# Patient Record
Sex: Male | Born: 1945 | Race: White | Hispanic: No | Marital: Married | Smoking: Never smoker
Health system: Southern US, Community
[De-identification: ages and names within clinical notes are randomized; demographics above are authoritative.]

## PROBLEM LIST (undated history)

## (undated) DIAGNOSIS — N400 Enlarged prostate without lower urinary tract symptoms: Secondary | ICD-10-CM

## (undated) DIAGNOSIS — R251 Tremor, unspecified: Secondary | ICD-10-CM

## (undated) DIAGNOSIS — M5416 Radiculopathy, lumbar region: Secondary | ICD-10-CM

## (undated) DIAGNOSIS — D649 Anemia, unspecified: Secondary | ICD-10-CM

## (undated) DIAGNOSIS — M1990 Unspecified osteoarthritis, unspecified site: Secondary | ICD-10-CM

## (undated) DIAGNOSIS — F419 Anxiety disorder, unspecified: Secondary | ICD-10-CM

## (undated) DIAGNOSIS — I10 Essential (primary) hypertension: Secondary | ICD-10-CM

## (undated) DIAGNOSIS — E042 Nontoxic multinodular goiter: Secondary | ICD-10-CM

## (undated) DIAGNOSIS — K602 Anal fissure, unspecified: Secondary | ICD-10-CM

## (undated) DIAGNOSIS — R51 Headache: Secondary | ICD-10-CM

## (undated) DIAGNOSIS — E785 Hyperlipidemia, unspecified: Secondary | ICD-10-CM

## (undated) DIAGNOSIS — N529 Male erectile dysfunction, unspecified: Secondary | ICD-10-CM

## (undated) DIAGNOSIS — C801 Malignant (primary) neoplasm, unspecified: Secondary | ICD-10-CM

## (undated) DIAGNOSIS — G4730 Sleep apnea, unspecified: Secondary | ICD-10-CM

## (undated) DIAGNOSIS — F329 Major depressive disorder, single episode, unspecified: Secondary | ICD-10-CM

## (undated) DIAGNOSIS — R233 Spontaneous ecchymoses: Secondary | ICD-10-CM

## (undated) DIAGNOSIS — L80 Vitiligo: Secondary | ICD-10-CM

## (undated) DIAGNOSIS — K2270 Barrett's esophagus without dysplasia: Secondary | ICD-10-CM

## (undated) DIAGNOSIS — Z8719 Personal history of other diseases of the digestive system: Secondary | ICD-10-CM

## (undated) DIAGNOSIS — K219 Gastro-esophageal reflux disease without esophagitis: Secondary | ICD-10-CM

## (undated) DIAGNOSIS — M51369 Other intervertebral disc degeneration, lumbar region without mention of lumbar back pain or lower extremity pain: Secondary | ICD-10-CM

## (undated) DIAGNOSIS — N189 Chronic kidney disease, unspecified: Secondary | ICD-10-CM

## (undated) DIAGNOSIS — F32A Depression, unspecified: Secondary | ICD-10-CM

## (undated) DIAGNOSIS — M5136 Other intervertebral disc degeneration, lumbar region: Secondary | ICD-10-CM

## (undated) DIAGNOSIS — G894 Chronic pain syndrome: Secondary | ICD-10-CM

## (undated) HISTORY — PX: HERNIA REPAIR: SHX51

## (undated) HISTORY — PX: OTHER SURGICAL HISTORY: SHX169

## (undated) HISTORY — PX: FUNCTIONAL ENDOSCOPIC SINUS SURGERY: SUR616

## (undated) HISTORY — PX: JOINT REPLACEMENT: SHX530

## (undated) HISTORY — PX: COLONOSCOPY: SHX174

## (undated) HISTORY — PX: ESOPHAGOGASTRODUODENOSCOPY: SHX1529

## (undated) HISTORY — PX: APPENDECTOMY: SHX54

---

## 2003-12-10 ENCOUNTER — Inpatient Hospital Stay (HOSPITAL_COMMUNITY): Admission: RE | Admit: 2003-12-10 | Discharge: 2003-12-13 | Payer: Self-pay | Admitting: Orthopedic Surgery

## 2004-01-07 ENCOUNTER — Other Ambulatory Visit: Payer: Self-pay

## 2004-01-12 ENCOUNTER — Encounter: Payer: Self-pay | Admitting: Orthopedic Surgery

## 2005-01-30 ENCOUNTER — Other Ambulatory Visit: Payer: Self-pay

## 2005-01-30 ENCOUNTER — Emergency Department: Payer: Self-pay | Admitting: Emergency Medicine

## 2006-01-07 ENCOUNTER — Ambulatory Visit: Payer: Self-pay | Admitting: Gastroenterology

## 2006-03-22 ENCOUNTER — Ambulatory Visit: Payer: Self-pay | Admitting: Gastroenterology

## 2006-04-22 ENCOUNTER — Ambulatory Visit: Payer: Self-pay | Admitting: General Surgery

## 2006-04-22 ENCOUNTER — Other Ambulatory Visit: Payer: Self-pay

## 2006-05-05 ENCOUNTER — Ambulatory Visit: Payer: Self-pay | Admitting: General Surgery

## 2006-06-18 ENCOUNTER — Ambulatory Visit: Payer: Self-pay | Admitting: Gastroenterology

## 2006-09-22 ENCOUNTER — Inpatient Hospital Stay (HOSPITAL_COMMUNITY): Admission: RE | Admit: 2006-09-22 | Discharge: 2006-09-26 | Payer: Self-pay | Admitting: Orthopedic Surgery

## 2006-10-11 ENCOUNTER — Encounter: Payer: Self-pay | Admitting: Orthopedic Surgery

## 2006-10-12 ENCOUNTER — Encounter: Payer: Self-pay | Admitting: Orthopedic Surgery

## 2006-10-19 ENCOUNTER — Ambulatory Visit: Payer: Self-pay | Admitting: Otolaryngology

## 2006-10-21 ENCOUNTER — Ambulatory Visit: Payer: Self-pay | Admitting: Otolaryngology

## 2006-11-12 ENCOUNTER — Encounter: Payer: Self-pay | Admitting: Orthopedic Surgery

## 2007-03-07 ENCOUNTER — Inpatient Hospital Stay: Payer: Self-pay | Admitting: Internal Medicine

## 2007-03-07 ENCOUNTER — Other Ambulatory Visit: Payer: Self-pay

## 2007-05-27 ENCOUNTER — Ambulatory Visit: Payer: Self-pay | Admitting: Otolaryngology

## 2007-09-26 ENCOUNTER — Encounter: Payer: Self-pay | Admitting: Otolaryngology

## 2007-10-12 ENCOUNTER — Encounter: Payer: Self-pay | Admitting: Otolaryngology

## 2007-10-18 ENCOUNTER — Other Ambulatory Visit: Payer: Self-pay

## 2007-10-18 ENCOUNTER — Emergency Department: Payer: Self-pay | Admitting: Emergency Medicine

## 2007-11-17 ENCOUNTER — Other Ambulatory Visit: Payer: Self-pay

## 2007-11-17 ENCOUNTER — Emergency Department: Payer: Self-pay | Admitting: Unknown Physician Specialty

## 2008-02-27 ENCOUNTER — Encounter: Admission: RE | Admit: 2008-02-27 | Discharge: 2008-02-27 | Payer: Self-pay | Admitting: Orthopedic Surgery

## 2008-03-23 ENCOUNTER — Ambulatory Visit: Payer: Self-pay | Admitting: Orthopedic Surgery

## 2008-06-25 ENCOUNTER — Ambulatory Visit: Payer: Self-pay | Admitting: Unknown Physician Specialty

## 2008-08-06 ENCOUNTER — Ambulatory Visit: Payer: Self-pay | Admitting: Unknown Physician Specialty

## 2008-08-14 ENCOUNTER — Ambulatory Visit: Payer: Self-pay | Admitting: Unknown Physician Specialty

## 2009-08-08 ENCOUNTER — Ambulatory Visit: Payer: Self-pay | Admitting: Gastroenterology

## 2010-04-04 ENCOUNTER — Inpatient Hospital Stay: Payer: Self-pay | Admitting: Internal Medicine

## 2010-04-08 IMAGING — CT CT HEAD WITHOUT CONTRAST
2 series · 16 of 30 positions shown, 20 images · non-contrast
Comparison: none

REASON FOR EXAM: dizzy,weak pt in rm # [id number]
COMMENTS:

[Series 2: without · axial · non-contrast · 0.40mm/px · z∈[+416,+551]mm · 13 of 33 slices shown, 17 images]
[im 3/33  brain]
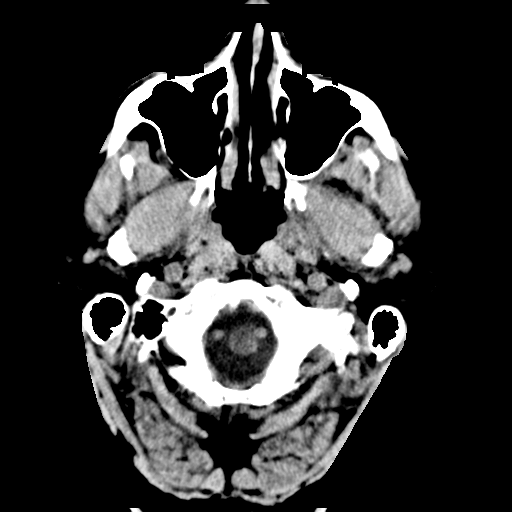
[im 3/33  bone]
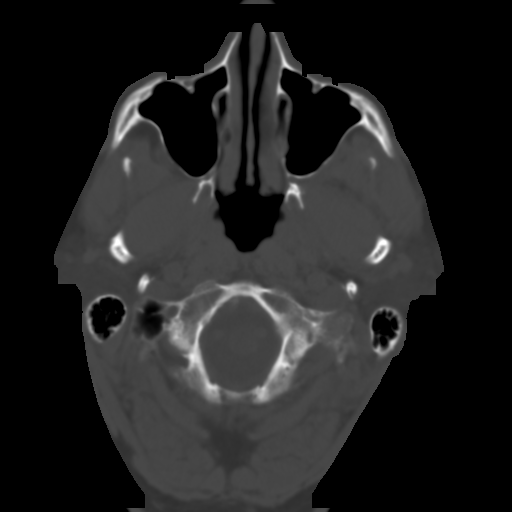
[im 5/33  brain]
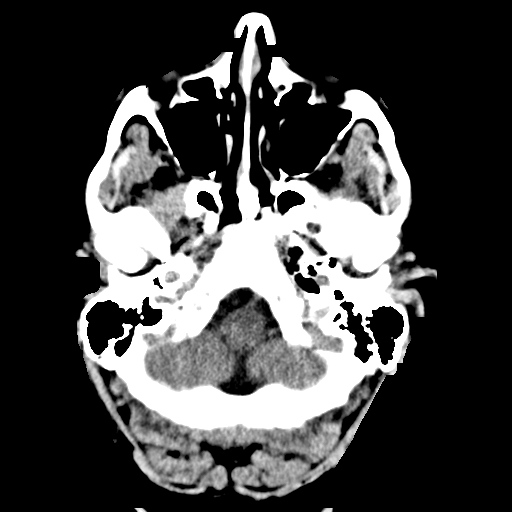
[im 7/33  brain]
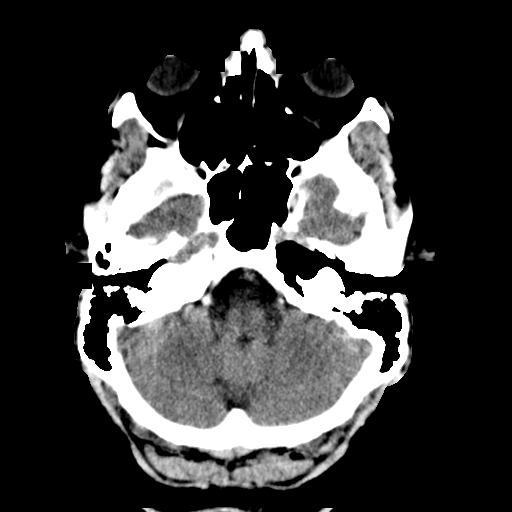
[im 10/33  brain]
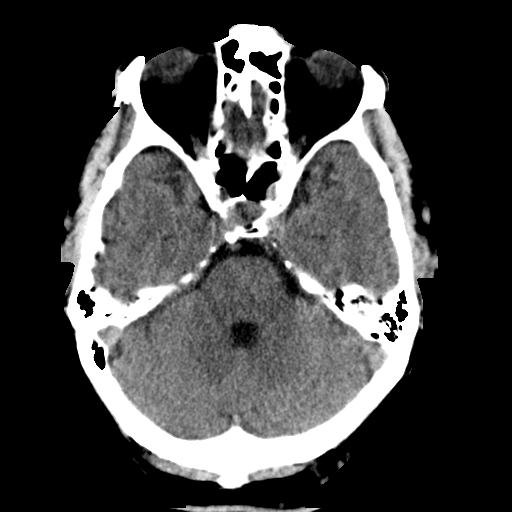
[im 12/33  brain]
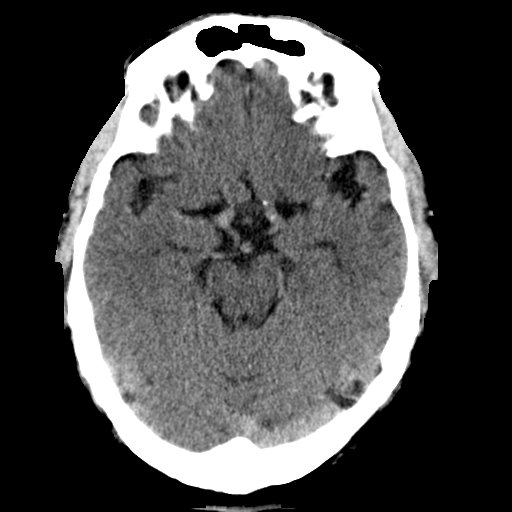
[im 12/33  bone]
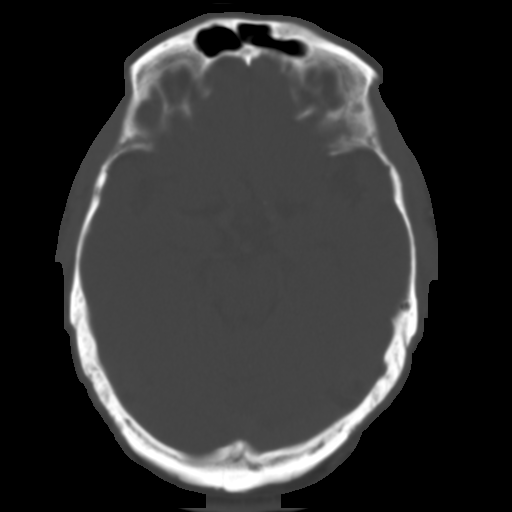
[im 14/33  brain]
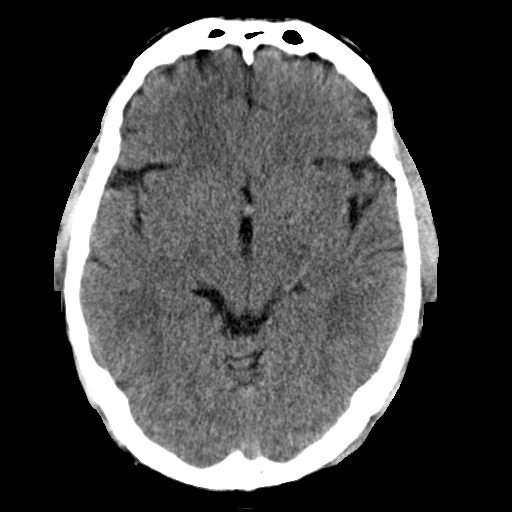
[im 17/33  brain]
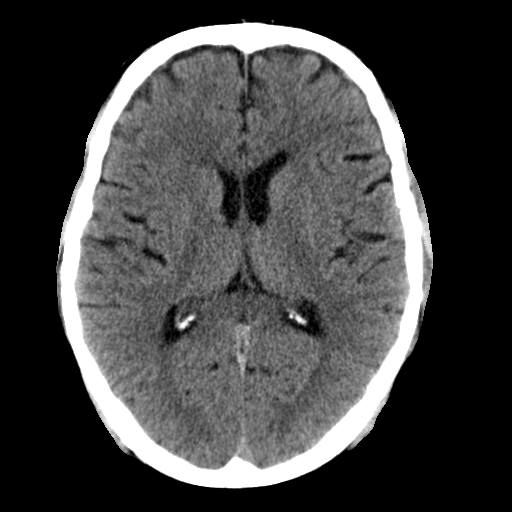
[im 19/33  brain]
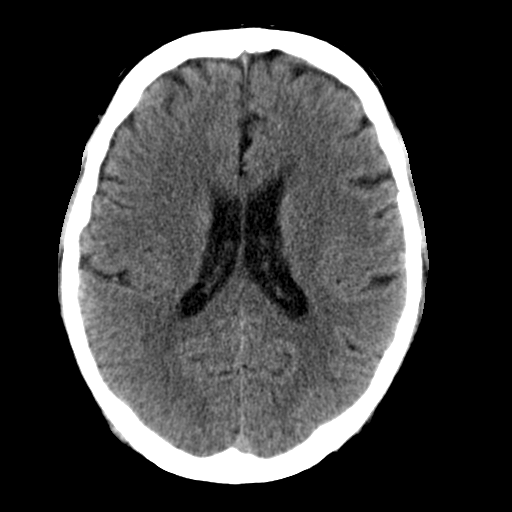
[im 21/33  brain]
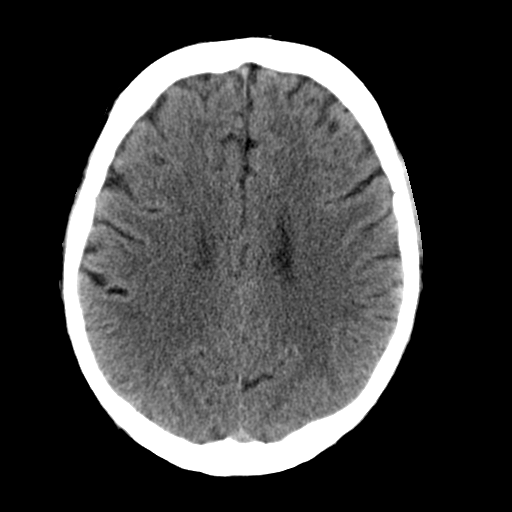
[im 21/33  bone]
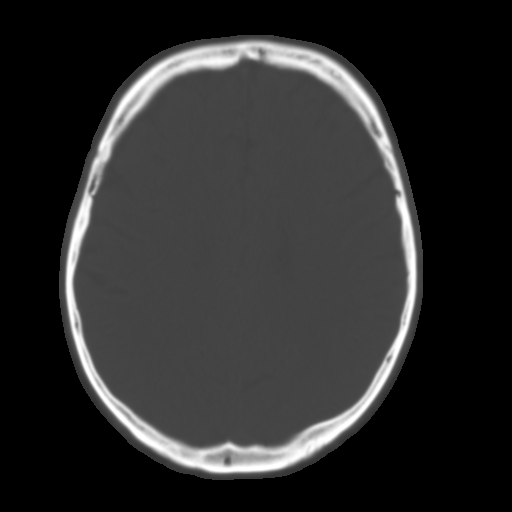
[im 23/33  brain]
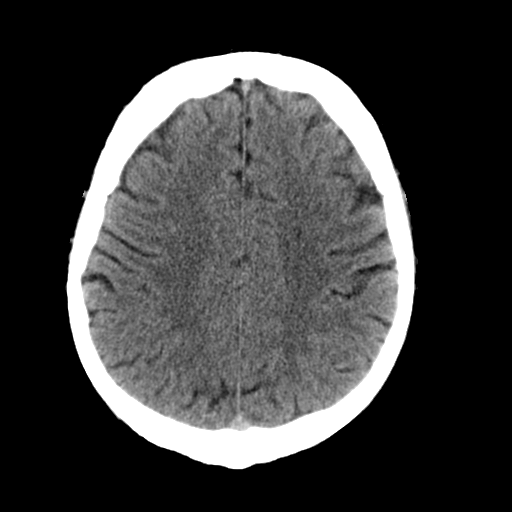
[im 26/33  brain]
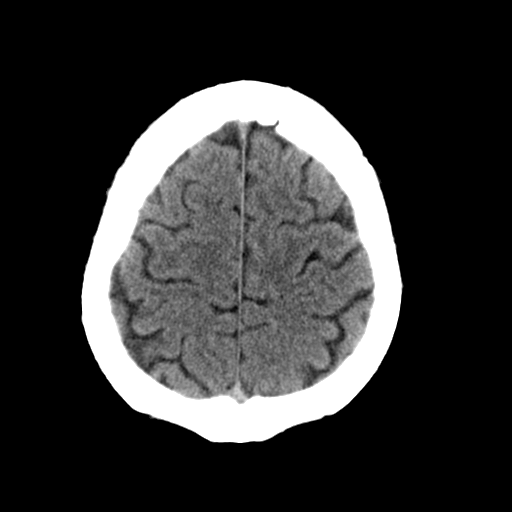
[im 28/33  brain]
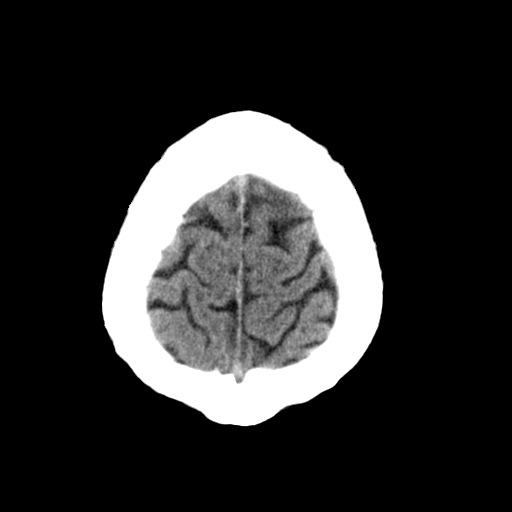
[im 30/33  brain]
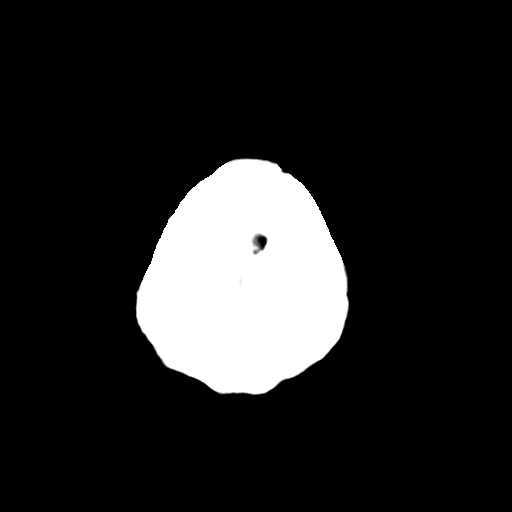
[im 30/33  bone]
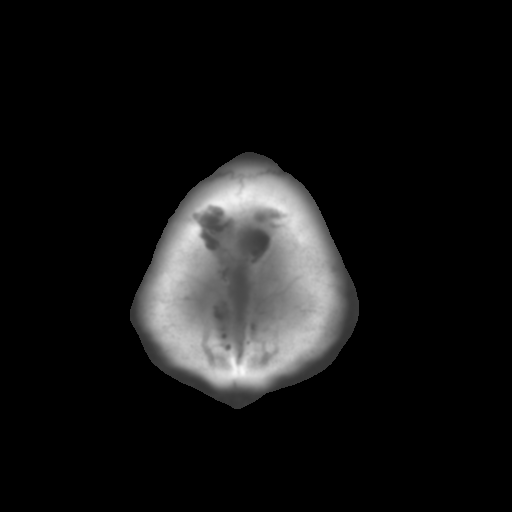

[Series 3: bone · axial · 0.40mm/px · z∈[+416,+461]mm · 3 of 33 slices shown]
[im 3/33  bone]
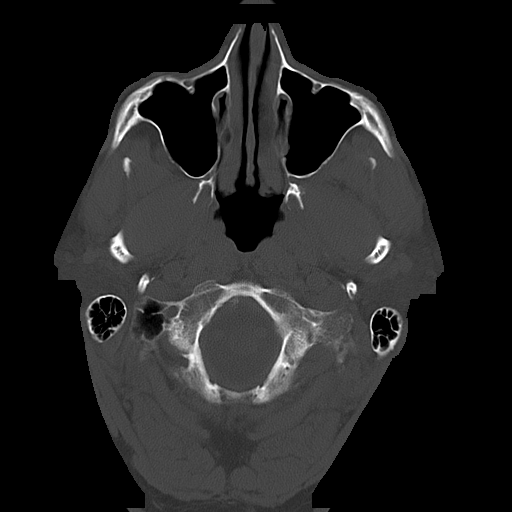
[im 7/33  bone]
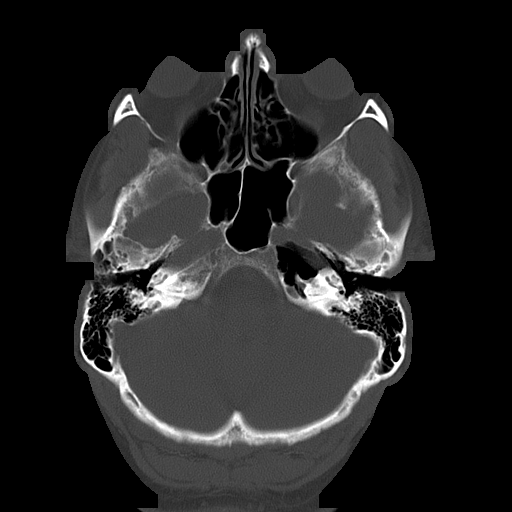
[im 12/33  bone]
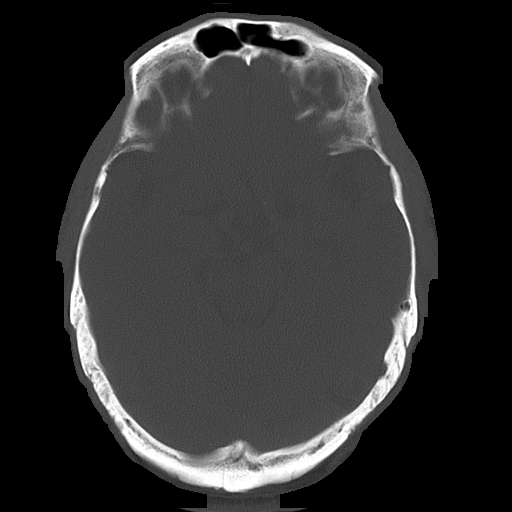

[16 of 30 positions shown; findings below may reference images not displayed]

PROCEDURE:     CT  - CT HEAD WITHOUT CONTRAST  - October 18, 2007 [DATE]

RESULT:     Noncontrast emergent CT of the brain is performed in the
standard fashion and compared to previous examination performed on
03/07/2007.

The ventricles and sulci are normal. There is no hemorrhage. There is no
focal mass, mass-effect or midline shift. There is no evidence of edema or
territorial infarct. The bone windows demonstrate normal aeration of the
paranasal sinuses and mastoid air cells. There is no skull fracture
demonstrated.
IMPRESSION: 1. No acute intracranial abnormality.

## 2010-04-13 HISTORY — PX: JOINT REPLACEMENT: SHX530

## 2010-07-24 ENCOUNTER — Encounter: Payer: Self-pay | Admitting: Internal Medicine

## 2010-08-12 ENCOUNTER — Encounter: Payer: Self-pay | Admitting: Internal Medicine

## 2010-08-13 ENCOUNTER — Ambulatory Visit: Payer: Self-pay | Admitting: Family Medicine

## 2010-08-29 NOTE — Discharge Summary (Signed)
Jeffrey [name], Jeffrey [name]              ACCOUNT NO.:  [id number]   MEDICAL RECORD NO.:  [id number]          PATIENT TYPE:  INP   LOCATION:  1540                         FACILITY:  [facility]   PHYSICIAN:  Ollen Gross, M.D.    DATE OF BIRTH:  09-01-1945   DATE OF ADMISSION:  09/22/2006  DATE OF DISCHARGE:  09/26/2006                               DISCHARGE SUMMARY   ADMISSION DIAGNOSES:  1. Osteoarthritis right knee.  2. History of migraines.  3. Anxiety.  4. Depression.  5. Hypertension.  6. Heart murmur.  7. Hiatal hernia.  8. Enlarged prostate.  9. Reflux disease.  10.Osteoporosis.  11.Degenerative disk disease.  12.Abdominal hernia.  13.Barrett's esophagus.  14.Hemorrhoids.  15.History of sleep apnea.  16.Hypertension.  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  18.Hyperlipidemia.  19.Anal fissure.   DISCHARGE DIAGNOSES:  1. Osteoarthritis right knee status post right total knee      arthroplasty.  2. Postop blood loss anemia.  Did not require transfusion.  3. History of migraines.  4. Anxiety.  5. Depression.  6. Hypertension.  7. Heart murmur.  8. Hiatal hernia.  9. Enlarged prostate.  10.Reflux disease.  11.Osteoporosis.  12.Degenerative disk disease.  13.Abdominal hernia.  14.Barrett's esophagus.  15.Hemorrhoids.  16.History of sleep apnea.  17.Hypertension.  18.Familial tremor.  19.Hyperlipidemia.  20.Anal fissure.   PROCEDURE:  On September 22, 2006 right total knee.  Surgeon Dr. Lequita Halt,  assistant Ellwood Dense, PA-C.  Anesthesia was general.  Consults none.   BRIEF HISTORY:  Jeffrey [name] is a 65 year old male with severe end-stage  arthritis of the right knee with progressive worsening pain and  dysfunction.  He has had a successful left total knee and now presents  for the right total knee.   LABORATORY DATA:  Preop CBC showed a hemoglobin of 15.0, hematocrit of  43.3, white cell count 5.0.  Postop hemoglobin down to 13.  Last noted  H&H was 10.8 and 31.5.  PT/PTT preop  14.2 and 36 respectively.  INR 1.1.  Serial Protimes followed, last PT/INR 22.6 and 1.9. Chem panel  outpatient; Chem panel within normal limits.  Serial BMETs were  followed.  Electrolytes remained within normal limits.  Glucose went up  to 149, back down to 126.  Preop UA negative.  Blood group type A+.  Two-  view chest September 15, 2006; interval improved pulmonary aeration.  No acute  findings.  EKG Aug 17, 2006; sinus rhythm, occasional premature  complexes.   HOSPITAL COURSE:  The patient was admitted to [facility].  Tolerated the procedure well.  Later was transferred to the recovery  room and orthopedic floor.  Started on PCA and p.o. analgesics for pain  control following surgery, given 24 hours postop antibiotics, given  Coumadin for DVT prophylaxis.  Had some pain during the evening after  the evening of surgery and into the morning of the next day.  The thigh  was sore.  Recommended a K-Pad likely due to the tourniquet.  Started  getting up out of bed on day one.  By day two was doing a little bit  better.  Pain  was improved.  Dressing changed.  Incision looked good.  Started getting up with the therapist and did a little ambulation.  Got  up about 40 feet.  Continued to progress well.  By day three pain was  under much better control ambulating about 110 feet.  Continued to work  on mobility and the patient improved and by September 26, 2006 was  independent with therapy, tolerating meds and was discharged home.   DISCHARGE/PLAN:  1. The patient discharged home on September 26, 2006.  2. Discharge diagnoses:  Please see above.  3. Discharge meds: Tylox, Robaxin, Coumadin.   DIET:  As tolerated.   ACTIVITY:  Weightbearing as tolerated.  Total knee protocol.  Home  health PT, home health nursing, range of motion strengthening.  May  start showering.   Follow-up 2 weeks.   DISPOSITION:  Home.   CONDITION ON DISCHARGE:  Improving.      Jeffrey [name],  P.A.C.      Ollen Gross, M.D.  Electronically Signed    ALP/MEDQ  D:  10/21/2006  T:  10/22/2006  Job:  [id number]   cc:   Ollen Gross, M.D.  Fax: [phone]   Dorothey Baseman  Fax: [phone]

## 2010-08-29 NOTE — H&P (Signed)
Jeffrey [name], Jeffrey [name]              ACCOUNT NO.:  [id number]   MEDICAL RECORD NO.:  [id number]          PATIENT TYPE:  INP   LOCATION:  1540                         FACILITY:  [facility]   PHYSICIAN:  Ollen Gross, M.D.    DATE OF BIRTH:  26-Nov-1945   DATE OF ADMISSION:  09/22/2006  DATE OF DISCHARGE:  09/26/2006                              HISTORY & PHYSICAL   CHIEF COMPLAINT:  Right knee pain.   HISTORY OF PRESENT ILLNESS:  The patient is a 65 year old male who has  seen by Dr. Lequita Halt for ongoing knee pain.  He is about 34 years out  from his left total knee and been doing very well.  Unfortunately, the  right knee continues to be progressive in nature and dysfunction.  He  was recently sent to [facility] for an opinion, was then shown a Uni-Spacer.  Unfortunately, his arthritis to advanced.  He was seen in the office and  found to have end-stage arthritis in the medial and patellofemoral  compartments.  He is bone on bone.  He slowly has reached the point  where he could benefit undergoing surgical intervention.  Risks and  benefits have been discussed, and he elects to proceed with surgery.   ALLERGIES:  NO KNOWN DRUG ALLERGIES.   CURRENT MEDICATIONS:  Lisinopril, hydrochlorothiazide, terazosin,  tramadol, omeprazole, potassium chloride.   PAST MEDICAL HISTORY:  History of migraines, anxiety, depression,  hypertension, heart murmur, hiatal hernia, reflux disease, benign  prostatic hypertrophy, osteoporosis, degenerative disk disease,  abdominal hernia, Barrett's esophagus, hemorrhoids, history of sleep  apnea, hypertension, familiar tremor, hyperlipidemia, history of anal  fissures.   PAST SURGICAL HISTORY:  Left total knee arthroplasty, hernia surgery,  sleep apnea and reconstructive surgery, appendectomy, facial  reconstructive surgery.   SOCIAL HISTORY:  Married, disabled, nonsmoker, no alcohol.  Two  children.   FAMILY HISTORY:  Mother with a history of hypertension and  rheumatoid  arthritis.   REVIEW OF SYSTEMS:  GENERAL:  No fevers, chills or night sweats.  NEURO:  No seizures, syncope or paralysis.  RESPIRATORY: Short of breath,  productive cough or hemoptysis.  CARDIOVASCULAR:  No chest pain, angina,  orthopnea.  GI:  No nausea, vomiting, diarrhea or constipation.  GU:  Does have a history of prostate problems with some frequency, urgency  and nocturia.  No dysuria or hematuria.  Musculoskeletal: Right knee.   PHYSICAL EXAM:  VITAL SIGNS:  Pulse 64, respirations 12, blood pressure  108/82.  GENERAL:  A 60-year white male well-nourished, well-developed, alert and  cooperative.  Good historian, is accompanied by his wife.  HEENT:  Normocephalic, atraumatic.  Pupils are reactive.  Oropharynx  clear.  EOMs intact.  NECK:  Supple.  CHEST:  Clear.  HEART:  Regular rate and rhythm without murmur.  ABDOMEN:  Soft, nontender.  Bowel sounds present.  BREASTS, GENITALIA:  Not done, note per the present illness.  EXTREMITIES:  Right knee, varus malalignment deformity, marked crepitus  noted.  Range of motion of 5-120, no instability.   IMPRESSION:  1. Osteoarthritis, right knee.  2. History of migraines.  3. Anxiety.  4. Depression.  5. Hypertension.  6. Heart murmur.  7. Hiatal hernia.  8. Reflux disease, enlarged prostate.  9. Osteoporosis.  10.Degenerative disk disease.  11.Abdominal hernia.  12.Barrett's esophagus.  13.Hemorrhoids.  14.History of sleep apnea.  15.Hypertension.  16.Familiar tremor.  17.Hyperlipidemia.  18.History of anal fissure.   PLAN:  The patient was admitted to [facility] to undergo a  right total knee replacement arthroplasty.  Surgery will be performed by  Dr. Ollen Gross.      Alexzandrew L. Perkins, P.A.C.      Ollen Gross, M.D.  Electronically Signed    ALP/MEDQ  D:  09/28/2006  T:  09/29/2006  Job:  [id number]   cc:   Ollen Gross, M.D.  Fax: [phone]   Dorothey Baseman  Fax:  [phone]

## 2010-08-29 NOTE — Op Note (Signed)
NAMEAMANTE, Jeffrey [name] NO.:  [name]   MEDICAL RECORD NO.:  [id number]          PATIENT TYPE:  INP   LOCATION:  0011                         FACILITY:  [facility]   PHYSICIAN:  Ollen Gross, M.D.    DATE OF BIRTH:  [birth year]   DATE OF PROCEDURE:  09/22/2006  DATE OF DISCHARGE:                               OPERATIVE REPORT   PREOPERATIVE DIAGNOSIS:  Osteoarthritis, right knee.   POSTOPERATIVE DIAGNOSIS:  Osteoarthritis, right knee.   PROCEDURE:  Right total knee arthroplasty.   SURGEON:  Ollen Gross, M.D.   ASSISTANT:  Avel Peace, PA-C   ANESTHESIA:  General with postop Marcaine pain pump.   ESTIMATED BLOOD LOSS:  Minimal.   DRAINS:  None.   TOURNIQUET TIME:  Thirty-six minutes at 300 mmHg.   COMPLICATIONS:  None.   CONDITION:  Stable to recovery.   BRIEF CLINICAL NOTE:  Mr. Deal is a 65 year old male severe end-  stage arthritis of the right knee with progressive worsening pain and  dysfunction.  He has had a successful left total knee arthroplasty and  presents now for right total knee arthroplasty.   PROCEDURE IN DETAIL:  After successful initiation of general anesthetic,  the tourniquet was placed high on the right thigh and right lower  extremity prepped and draped in the usual sterile fashion.  Extremity  was wrapped in Esmarch, knee flexed and tourniquet inflated to 300 mmHg.   A midline incision made with 10 blade through subcutaneous tissue to the  level of the extensor mechanism.  A fresh blade was used to make a  medial parapatellar arthrotomy.  The soft tissue of the proximal medial  tibia subperiosteally was elevated to the joint line with the knife and  into the semimembranosus bursa with a Cobb elevator.  The soft tissue  laterally was elevated with attention being paid to avoid patellar  tendon on tibial tubercle.  The patella was subluxed laterally, knee  flexed to 90 degrees and ACL and PCL were removed.  A drill was used  to  create a starting hole in the distal femur and canal was thoroughly  irrigated.  A 5 degree right valgus alignment guide was placed and  referencing off the posterior condyles, rotation was marked and a block  pinned to remove 10 mm of the distal femur.  The distal femoral  resection was made with an oscillating saw.  Sizing blocks were placed,  size 5 was most appropriate.  Rotation was marked at the epicondylar  axis.  A size 5 cutting block was placed.  Anterior-posterior chamfer  cuts were made.   The tibia was subluxed forward and the menisci removed.  An  extramedullary tibial alignment guide was placed referencing proximally  at the medial aspect of the tibial tubercle and distally along the  second metatarsal axis and tibial crest.  A block was pinned to remove  10 mm off the nondeficient lateral side.  This did not get to the bottom  of the medial defects.  We had to take an additional 2 mm.  The size 5  was the most  appropriate tibial component and the proximal tibia was  prepared with the modular drill and keel punch for the size 5.  Femoral  preparation was completed with the intercondylar cut.   A size 5 mobile bearing tibial trial size 5 posterior stabilized femoral  trial and a 12.5 mm posterior stabilized rotating platform insert trial  were placed.  With the 12.5, there is still a little play, so we went to  a 15 which allowed for full extension with excellent varus and valgus  balance and the anterior-posterior balance throughout full range of  motion.  The patella was then everted and it was measured to be 26 mm.  Freehand resection was taken to 15 mm, 41 template was placed, lug holes  were drilled, trial patella was placed and it tracked normally.  Osteophytes were removed off the posterior femur with the trial placed.  All trials were removed and the cut bone surfaces were prepared with  pulsatile lavage.  Cement was mixed and once ready for implantation, the   size 5 mobile bearing tibial tray size 5 posterior stabilized femur and  41 patella were cemented into place.  The patella was held with a clamp.  The trial 15 insert was placed, knee in full extension and all extruded  cement removed.  Once the cement was fully hardened, then a permanent 15  mm posterior stabilized rotating platform insert was placed into the  tibial tray.   The wound was copiously irrigated with saline solution.  FloSeal was  injected, 10 mL, throughout the medial and lateral gutters,  suprapatellar area and posterior aspect of the joint.  The tourniquet  was released with total time of 36 minutes.  A moist sponge was placed  over the joint and after 2 minutes, we inspected and minimal bleeding  was noted.  Any bleeding we identified we stopped with electrocautery.  The arthrotomy was then closed with interrupted #1 PDS in flexion  against gravity was 135 degrees.  Subcu was closed with interrupted 2-0  Vicryl and subcuticular running 4-0 Monocryl.  The catheter for the  Marcaine pain pump was placed and the pump was initiated.  Steri-Strips  and a bulky sterile dressing were applied.  He was placed into a knee  immobilizer, awakened and transported to recovery in stable condition.      Ollen Gross, M.D.  Electronically Signed     FA/MEDQ  D:  09/22/2006  T:  09/23/2006  Job:  [id number]

## 2010-08-29 NOTE — H&P (Signed)
NAME:  Jeffrey [name], Jeffrey [name]                        ACCOUNT NO.:  [id number]   MEDICAL RECORD NO.:  [id number]                   PATIENT TYPE:  INP   LOCATION:  NA                                   FACILITY:  [facility]   PHYSICIAN:  Ollen Gross, M.D.                 DATE OF BIRTH:  [birth year]   DATE OF ADMISSION:  12/10/2003  DATE OF DISCHARGE:                                HISTORY & PHYSICAL   CHIEF COMPLAINT:  Left knee pain.   HISTORY OF PRESENT ILLNESS:  The patient is a 65 year old male with a 2- to  3-year history of left knee pain that has progressively become much worse  over the past several months.  The pain is mostly medially.  He has not had  any hip pain or low back pain, the knee has gotten to the point where it is  hurting him all the time.  Dr. Katrinka Blazing has performed aspirations on three  different occasions and he also has completed a series of Synvisc injections  and the injections really did not help much and he has had continued pain,  he was noted to have significant medial compartment arthritis and sent him  over for possible consideration of unicompartmental replacement.  It is felt  he does have significant arthritis warranting replacement although it is not  felt he is a very good candidate because he has recurrent effusions which  increases the risk for poor outcome and also continued progression of his  arthritis.  It is felt that he would best be served by undergoing for  longevity a total knee replacement, risks and benefits of the total knee  replacement have been discussed with the patient and he elects to proceed  with surgery.   ALLERGIES:  No known drug allergies.   CURRENT MEDICATIONS:  1. Terazosin 2 mg one to two tablets at night as needed.  2. Hydrochlorothiazide 25 mg daily.  3. Propranolol 80 mg daily.  4. Protonix twice a day.   PAST MEDICAL HISTORY:  Past history of migraines, anxiety, depression,  hypertension, past history of a heart murmur,  abdominal hernia, reflux  disease, hemorrhoids, osteoporosis, history of bulging disk at L4-5, history  of sleep apnea.   PAST SURGICAL HISTORY:  Appendectomy 1954, reconstructive right cheek  surgery April 1977, sinus surgery 1980, sleep apnea surgery with a small  hardware screw placed in the chin area April 1999, history of Barrett's  esophagus.   SOCIAL HISTORY:  Married, patient works in Engineer, building services,  nonsmoker, no alcohol, has two daughters, his spouse will be assisting with  care after surgery.   FAMILY HISTORY:  Mother with a history of rheumatoid arthritis,  hypertension.   REVIEW OF SYSTEMS:  GENERAL:  No fevers, chills, night sweats.  NEURO:  No  seizures, syncope, or paralysis.  Does give a history of migraines which he  has very seldom.  RESPIRATORY:  No shortness of breath, productive cough, or  hemoptysis.  CARDIOVASCULAR:  No chest pain, angina, orthopnea.  He gets a  little bit of shortness of breath on exertion but no shortness of breath at  rest.  GI:  He does have an abdominal hernia.  No nausea/vomiting.  He had a  little bit of epigastric pain several weeks ago with no pleuritic component,  he said it was more of a cramping pain which only lasted for a couple of  seconds.  No diarrhea, no constipation, no bloody mucous in the stool.  GU:  He does have a little bit of urgency and nocturia.  No dysuria, hematuria,  or discharge.  MUSCULOSKELETAL:  Pertinent to that of the knee found in the  history of present illness.   PHYSICAL EXAMINATION:  VITAL SIGNS:  Pulse 56, respirations 12, blood  pressure 124/88.  GENERAL:  This is a 65 year old white male well-nourished, well-developed,  slightly overweight.  He is alert, oriented, and cooperative.  HEENT:  Normocephalic, atraumatic.  Pupils round and reactive.  Oropharynx  clear.  EOMs are intact.  NECK:  Supple.  No carotid bruits are appreciated.  CHEST:  Clear anterior-posterior chest walls.  No  rhonchi, rales, or  wheezing.  HEART:  Regular rate and rhythm.  No murmurs.  S1 and S2 noted.  ABDOMEN:  Soft, slightly round abdomen, bowel sounds are present, nontender.  RECTAL/BREASTS/GENITALIA:  Not done not pertinent to present illness.  EXTREMITIES SIGNIFICANT TO THE LEFT LOWER EXTREMITY:  Left knee shows range  of motion of 5-110 degrees passively.  He is more tender medial joint line  than he is lateral joint line.  There is no obvious instability.  Motor  function is intact.   IMPRESSION:  1. Osteoarthritis left knee.  2. History of migraines.  3. Anxiety.  4. Depression.  5. Hypertension.  6. Abdominal hernia.  7. Reflux disease.  8. Barrett's esophagus.  9. Hemorrhoids.  10.      Osteoporosis.  11.      Degenerative disk disease with disk bulge L4-5.  12.      History of sleep apnea, status post reconstructive surgery.   PLAN:  Patient will be admitted to [facility] to undergo a left  total knee replacement  arthroplasty.  Surgery will be performed by Dr.  Ollen Gross.  Patient does have a history of sleep apnea and he has had  surgery to correct, he does have a CPAP machine which he will bring to the  hospital.     Alexzandrew L. Julien Girt, P.A.              Ollen Gross, M.D.    ALP/MEDQ  D:  12/06/2003  T:  12/07/2003  Job:  [id number]   cc:   Dorothey Baseman  [address]  [place]  [place] [address]  Fax: [phone]

## 2010-08-29 NOTE — Discharge Summary (Signed)
NAMECARLSON, Jeffrey [name]              ACCOUNT NO.:  [id number]   MEDICAL RECORD NO.:  [id number]          PATIENT TYPE:  INP   LOCATION:  0472                         FACILITY:  [facility]   PHYSICIAN:  Ollen Gross, M.D.    DATE OF BIRTH:  1946-01-14   DATE OF ADMISSION:  12/10/2003  DATE OF DISCHARGE:  December 17, 2003                                 DISCHARGE SUMMARY   ADMISSION DIAGNOSES:  1.  Osteoarthritis, left knee.  2.  History of migraines.  3.  Anxiety.  4.  Depression.  5.  Hypertension.  6.  Abdominal hernia.  7.  Reflux disease.  8.  Barrett's esophagus.  9.  Hemorrhoids.  10. Osteoporosis.  11. Degenerative disk disease with disk bulge at L4-5.  12. History of sleep apnea, status post reconstructive surgery.   DISCHARGE DIAGNOSES:  1.  Osteoarthritis, left knee, status post left total knee arthroplasty.  2.  Mild postoperative hypokalemia, improved.  3.  Mild postoperative hyponatremia, improved.  4.  History of migraines.  5.  Anxiety.  6.  Depression.  7.  Hypertension.  8.  Abdominal hernia.  9.  Reflux disease.  10. Barrett's esophagus.  11. Hemorrhoids.  12. Osteoporosis.  13. Degenerative disk disease with disk bulge at L4-5.  14. History of sleep apnea, status post reconstructive surgery.   PROCEDURE:  On December 10, 2003, left total knee arthroplasty.   SURGEON:  Ollen Gross, M.D.   ASSISTANT:  Avel Peace, PA-C.   ANESTHESIA:  General with a postop Marcaine pain pump.   ESTIMATED BLOOD LOSS:  Minimal.   DRAINS:  Hemovac x1.   TOURNIQUET TIME:  Forty-five minutes at 300 mmHg.   CONSULTS:  None.   BRIEF HISTORY:  Mr. Bentson is a 65 year old male with end-stage arthritis  of the left knee and has recurrent effusions with intractable pain.  He has  failed nonoperative management, including injections, and presents for a  total knee arthroplasty.   LABORATORY DATA:  CBC preop 15.5, hemoglobin and hematocrit of 44.4,  differential within normal  limits with the exception of monos elevated at  14.  Normal white count of 6.1.  Postop H&H of 12.5 and 35.8.  Last noted  H&H of 11.3 and 33.2.  Pro time, PT/PTT preop, 13.2 and 35, respectively.  An INR of 1.0.  Serial pro times  followed.  Last noted PT/INR 17.5 and 1.7.  Chem panel on admission all within normal limits.  Serial BMETs are  followed.  Sodium dropped from 141 to 134, back up to 135.  Potassium  dropped from 4.1 to 3.0, back up to 3.5.  Urinalysis preop:  Cloudy.  Trace  leukocyte esterase, rare epithelial cells, 0-2 white cells, otherwise  negative.  Blood group type A+.   DIAGNOSTIC STUDIES:  Doppler study taken on 12-17-2003:  No evidence  of DVT, SVT, or Baker's cyst.  No noted interstitial fluid in the calf area.   EKG dated December 04, 2003:  Sinus bradycardia.  Left axis deviation.  Minimal voltage criteria for LVH.  No previous EKGs available.  Confirmed by  Dr. Jacinto Halim.   Two-view chest dated December 04, 2003:  No active disease.   HOSPITAL COURSE:  Patient was admitted to [facility]  and taken to  the OR and underwent the above-stated procedure without complications.  Transferred to the recovery room and then to the orthopedic floor for  continued postoperative care.  Vital signs were followed.  Patient had some  mild hypokalemia and mild hyponatremia postoperatively.  Given potassium  supplements.  Fluids were adjusted.  Patient had very good control of pain.  By day #2, much less pain.  His sodium and potassium were starting to  improve.  Hemovac drain, which was placed at the time of surgery, was  removed on day #1 without difficulty.  The pain pump was removed on day #2  at the time of the dressing change.  The incision was healing well.  PCA and  IV's were discontinued on day #2.  He started getting up with physical  therapy and was already out of bed and walking on day #1; however, by day  #2, the patient was up ambulating approximately 70  feet, then he progressed  further by day #3, on which he was doing so well.  It was decided that the  patient would be discharged home.  He did have a little bit of calf pain  that morning, so it was decided to order a Doppler before his discharge.  Doppler proved to be negative.  He was discharged home.   DISCHARGE PLAN:  1.  Patient was discharged home on December 13, 2003.  2.  Discharge diagnoses:  Please see above.  3.  Discharge meds:  Percocet, Robaxin, Coumadin.  4.  Diet:  Low sodium diet.  5.  Activity:  Weightbearing as tolerated.  Home health PT/OT and home      health nursing.  Total knee protocol.  6.  Follow up in two weeks from surgery.  Contact the office for an      appointment.   DISPOSITION:  Home.   CONDITION ON DISCHARGE:  Improved.      ALP/MEDQ  D:  01/16/2004  T:  01/16/2004  Job:  [id number]   cc:   Dorothey Baseman  [address]  [place]  [place] [address]  Fax: [phone]

## 2010-08-29 NOTE — Op Note (Signed)
NAME:  Jeffrey [name]                        ACCOUNT NO.:  [id number]   MEDICAL RECORD NO.:  [id number]                   PATIENT TYPE:  INP   LOCATION:  0005                                 FACILITY:  [facility]   PHYSICIAN:  Ollen Gross, M.D.                 DATE OF BIRTH:  1945-12-10   DATE OF PROCEDURE:  12/10/2003  DATE OF DISCHARGE:                                 OPERATIVE REPORT   PREOPERATIVE DIAGNOSIS:  Osteoarthritis, left knee.   POSTOPERATIVE DIAGNOSIS:  Osteoarthritis, left knee.   PROCEDURE:  Left total knee arthroplasty.   SURGEON:  Ollen Gross, M.D.   ASSISTANT:  Alexzandrew L. Julien Girt, P.A.   ANESTHESIA:  General with postop Marcaine pain pump.   ESTIMATED BLOOD LOSS:  Minimal.   DRAINS:  Hemovac x1.   TOURNIQUET TIME:  45 minutes at 300 mmHg.   COMPLICATIONS:  None.   CONDITION:  Stable to recovery.   BRIEF CLINICAL NOTE:  [name] is a 65 year old male with end-stage  arthritis of the left knee with recurrent effusions and intractable pain.  He has failed nonoperative management including injections and presents now  for left total knee arthroplasty.   PROCEDURE IN DETAIL:  After the successful administration of general  anesthetic, a tourniquet was placed high on the left thigh and the left  lower extremity prepped and draped in the usual sterile fashion.  The leg  was wrapped in an Esmarch, the knee flexed, the tourniquet inflated to 300  mmHg.  A standard midline incision was made through the subcutaneous tissue  to the level of the extensor mechanism.  A fresh blade was used to make a  medial parapatellar arthrotomy.  We encountered an extremely large effusion  and hypertrophic synovitis.  The soft tissue over the proximal medial tibia  is subperiosteally elevated to the joint line with a knife and into the  semimembranosus bursa with a curved osteotome.  Soft tissue over the  proximal lateral tibia is also elevated with attention being  paid to  avoiding the patellar tendon on the tibial tubercle.  The patella was  everted, the knee flexed 90 degrees, and ACL and PCL removed.  He was  completely bone-on-bone in the medial compartment with patellofemoral  involvement also.  A drill was used to create a starting hole in the distal  femur, and the canal was irrigated.  A 5 degree left valgus alignment guide  is placed and referencing off the posterior condyles, rotation is marked and  a block pinned to remove 10 mm off the distal femur.  Distal femoral  resection is made with an oscillating saw.  A sizing block is placed and a  size 5 is most appropriate.  Rotation is marked off the epicondylar axis.  The AP cutting block is placed and anterior and posterior cuts made.   The tibia is subluxed forward and the menisci are removed.  The  extramedullary tibial alignment guide is placed, referencing proximally at  the medial aspect of the tibial tubercle and distally along the second  metatarsal axis and tibial crest.  The block is pinned to remove 10 mm off  the nondeficient lateral side.  Tibial resection is made with an oscillating  saw.  A size 5 is the most appropriate tibial component, and the proximal  tibia is prepared with the modular drill and keel punch for a size 5.  Femoral preparation is completed with the intercondylar and chamfer cuts.   A size 5 mobile bearing tibial trial, a size 5 posterior-stabilized femoral  trial with a 10 mm posterior-stabilized rotating platform insert trial were  placed.  With the 10 he hyperextended a few degrees and with the 12.5, which  allowed for full extension with excellent varus and valgus balance  throughout full range of motion.  The patella was then everted.  It was  measured to be 26 mm, free-hand resection taken to 14 mm.  A 41 template is  placed, lug holes are drilled, a trial patella is placed, and it tracks  normally.  Osteophytes are then removed off the posterior femur  with the  trial in place.  All the trials were removed and the cut bone surfaces  prepared with pulsatile lavage.  Cement is mixed and once ready for  implantation, the size 5 mobile bearing tibial tray, size 5 posterior-  stabilized femur, and 41 patella are cemented into place, and the patella is  held with a clamp.  A trial 12.5 insert is placed and the knee held in full  extension and all extruded cement removed.  Once the cement is fully  hardened, then a permanent 12.5 mm posterior-stabilized rotating platform  insert is placed into the tibial tray.  Full extension is again achieved  with excellent balance throughout full range of motion.  The wound was  copiously irrigated with antibiotic solution and the extensor mechanism  closed over a Hemovac drain.  The tourniquet was released with a total time  of 45 minutes.  Flexion against gravity is 135 degrees.  The subcu is closed  with interrupted 2-0 Vicryl.  Prior to closing the subcu I removed some  prepatellar bursal tissue that was irritating him preoperatively.  When the  subcu was closed, then the subcuticular was closed in a running 4-0  Monocryl.  The catheter for the Marcaine pain pump is placed and the pump  initiated.  The incision is cleaned and dried and Steri-Strips and a bulky  sterile dressing applied.  He is then awakened, placed into a knee  immobilizer, and transported to recovery in stable condition.                                               Ollen Gross, M.D.    FA/MEDQ  D:  12/10/2003  T:  12/10/2003  Job:  [id number]

## 2010-10-30 ENCOUNTER — Ambulatory Visit: Payer: Self-pay | Admitting: Gastroenterology

## 2011-01-28 LAB — PROTIME-INR: Prothrombin Time: 22.6 — ABNORMAL HIGH

## 2011-01-29 LAB — URINALYSIS, ROUTINE W REFLEX MICROSCOPIC
Bilirubin Urine: NEGATIVE
Glucose, UA: NEGATIVE
Hgb urine dipstick: NEGATIVE
Ketones, ur: NEGATIVE
Urobilinogen, UA: 0.2
pH: 7

## 2011-01-29 LAB — CBC
HCT: 31.5 — ABNORMAL LOW
HCT: 33.3 — ABNORMAL LOW
HCT: 43.3
Hemoglobin: 10.8 — ABNORMAL LOW
Hemoglobin: 11.5 — ABNORMAL LOW
Hemoglobin: 13
MCV: 88.3
MCV: 89.2
MCV: 89.6
Platelets: 114 — ABNORMAL LOW
RDW: 13.2
RDW: 13.2
RDW: 13.6
WBC: 5
WBC: 7.5

## 2011-01-29 LAB — BASIC METABOLIC PANEL
BUN: 16
CO2: 31
Chloride: 106
Creatinine, Ser: 1.05
Creatinine, Ser: 1.27
GFR calc Af Amer: >60
Glucose, Bld: 126 — ABNORMAL HIGH
Potassium: 4.3
Sodium: 135
Sodium: 140

## 2011-01-29 LAB — PROTIME-INR
INR: 1.1
INR: 1.3
Prothrombin Time: 14.2
Prothrombin Time: 19.9 — ABNORMAL HIGH
Prothrombin Time: 21.5 — ABNORMAL HIGH

## 2011-01-29 LAB — COMPREHENSIVE METABOLIC PANEL
AST: 32
Albumin: 4
BUN: 17
CO2: 32
Calcium: 9.9
Chloride: 107
Creatinine, Ser: 1.11
Potassium: 4.5

## 2011-01-29 LAB — TYPE AND SCREEN: Antibody Screen: NEGATIVE

## 2012-08-17 ENCOUNTER — Ambulatory Visit: Payer: Self-pay | Admitting: Internal Medicine

## 2012-08-23 ENCOUNTER — Emergency Department: Payer: Self-pay | Admitting: Emergency Medicine

## 2013-03-08 ENCOUNTER — Telehealth: Payer: Self-pay | Admitting: *Deleted

## 2013-03-08 NOTE — Telephone Encounter (Signed)
Physicians report paperwork for independent and supportive living faxed to [place] at [facility]. Copy placed in batch.

## 2013-06-26 ENCOUNTER — Ambulatory Visit: Payer: Self-pay | Admitting: Gastroenterology

## 2013-06-28 LAB — PATHOLOGY REPORT

## 2013-10-06 DIAGNOSIS — K2270 Barrett's esophagus without dysplasia: Secondary | ICD-10-CM | POA: Insufficient documentation

## 2013-10-06 DIAGNOSIS — F32A Depression, unspecified: Secondary | ICD-10-CM | POA: Insufficient documentation

## 2013-10-06 DIAGNOSIS — N400 Enlarged prostate without lower urinary tract symptoms: Secondary | ICD-10-CM | POA: Insufficient documentation

## 2013-10-06 DIAGNOSIS — M1990 Unspecified osteoarthritis, unspecified site: Secondary | ICD-10-CM | POA: Insufficient documentation

## 2013-10-06 DIAGNOSIS — K219 Gastro-esophageal reflux disease without esophagitis: Secondary | ICD-10-CM | POA: Insufficient documentation

## 2013-11-29 ENCOUNTER — Emergency Department: Payer: Self-pay | Admitting: Emergency Medicine

## 2013-11-29 LAB — COMPREHENSIVE METABOLIC PANEL
ALBUMIN: 3.7 g/dL (ref 3.4–5.0)
ALT: 26 U/L
AST: 35 U/L (ref 15–37)
Alkaline Phosphatase: 57 U/L
Anion Gap: 11 (ref 7–16)
BUN: 15 mg/dL (ref 7–18)
Bilirubin,Total: 0.7 mg/dL (ref 0.2–1.0)
CHLORIDE: 106 mmol/L (ref 98–107)
Calcium, Total: 8.8 mg/dL (ref 8.5–10.1)
Co2: 28 mmol/L (ref 21–32)
Creatinine: 1.21 mg/dL (ref 0.60–1.30)
EGFR (Non-African Amer.): >60
Glucose: 108 mg/dL — ABNORMAL HIGH (ref 65–99)
OSMOLALITY: 290 (ref 275–301)
Potassium: 3.5 mmol/L (ref 3.5–5.1)
Sodium: 145 mmol/L (ref 136–145)
Total Protein: 7.4 g/dL (ref 6.4–8.2)

## 2013-11-29 LAB — CBC WITH DIFFERENTIAL/PLATELET
Basophil #: 0.1 10*3/uL (ref 0.0–0.1)
Basophil %: 0.8 %
EOS ABS: 0.1 10*3/uL (ref 0.0–0.7)
EOS PCT: 1.9 %
HCT: 42.1 % (ref 40.0–52.0)
HGB: 14.6 g/dL (ref 13.0–18.0)
LYMPHS ABS: 1.7 10*3/uL (ref 1.0–3.6)
Lymphocyte %: 25.1 %
MCH: 31.4 pg (ref 26.0–34.0)
MCHC: 34.6 g/dL (ref 32.0–36.0)
MCV: 91 fL (ref 80–100)
MONO ABS: 1 x10 3/mm (ref 0.2–1.0)
MONOS PCT: 14.4 %
NEUTROS ABS: 3.9 10*3/uL (ref 1.4–6.5)
NEUTROS PCT: 57.8 %
PLATELETS: 132 10*3/uL — AB (ref 150–440)
RBC: 4.64 10*6/uL (ref 4.40–5.90)
RDW: 14.2 % (ref 11.5–14.5)
WBC: 6.7 10*3/uL (ref 3.8–10.6)

## 2013-11-29 LAB — CK TOTAL AND CKMB (NOT AT ARMC)
CK, Total: 195 U/L
CK-MB: 3.6 ng/mL (ref 0.5–3.6)

## 2013-11-29 LAB — TROPONIN I

## 2013-12-06 ENCOUNTER — Ambulatory Visit (INDEPENDENT_AMBULATORY_CARE_PROVIDER_SITE_OTHER): Payer: Medicare HMO | Admitting: Neurology

## 2013-12-06 ENCOUNTER — Encounter: Payer: Self-pay | Admitting: Neurology

## 2013-12-06 VITALS — BP 135/80 | HR 51 | Ht 74.0 in | Wt 236.4 lb

## 2013-12-06 DIAGNOSIS — I671 Cerebral aneurysm, nonruptured: Secondary | ICD-10-CM

## 2013-12-06 DIAGNOSIS — G250 Essential tremor: Secondary | ICD-10-CM | POA: Insufficient documentation

## 2013-12-06 DIAGNOSIS — G252 Other specified forms of tremor: Secondary | ICD-10-CM

## 2013-12-06 DIAGNOSIS — F411 Generalized anxiety disorder: Secondary | ICD-10-CM

## 2013-12-06 MED ORDER — PRIMIDONE 50 MG PO TABS: ORAL_TABLET | ORAL | Status: DC

## 2013-12-06 MED ORDER — PRIMIDONE 50 MG PO TABS: 25.0000 mg | ORAL_TABLET | Freq: Every day | ORAL | Status: DC

## 2013-12-06 NOTE — Progress Notes (Signed)
NEUROLOGY CLINIC NEW PATIENT NOTE  NAME: Jeffrey [name] DOB: 1945/12/02  I saw Jeffrey [name] as a new patient in the neurovascular clinic today regarding  Chief Complaint  Patient presents with  . Neurologic Problem    Np#1  .  HPI: Jeffrey [name] is a 68 y.o. male with PMH of HTN and anxiety who presents as a new patient for ICA aneurysm. He stated that he had been to ED 3 times recently due to hypertensive urgency with BP 220/112 once and 196/107 the other time. 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 She was referred by ED physician for evaluation of the cerebral aneurysm. His BP today in clinic is very good at 135/80, and he has no symptoms. He stated that when his BP high, he was dizzy which triggers his anxiety and his BP was even higher.   He has hx of HTN and on po meds, his meds has been increased by ED physician. Currently he is taking lisinopril 40mg  bid and amlodipine 5mg  bid.   He also has anxiety disorder and on Xanax PRN. He stated that he is not taking Xanax often.   He mentioned to me that he also has tremors, difficulty with signature and sometimes holding cups with spills. Stress would make it worse and alcohol would make it better. His mom also had the tremor. He was given propanolol briefly in the past but did not work well.   He denies any smoking, alcohol or illicit drugs.  No past medical history on file. No past surgical history on file. No family history on file. Current Outpatient Prescriptions  Medication Sig Dispense Refill  . ALPRAZolam (XANAX) 0.25 MG tablet Take 0.25 mg by mouth as needed.      Marland Kitchen amLODipine (NORVASC) 5 MG tablet Take 5 mg by mouth 2 (two) times daily.      . B Complex Vitamins (VITAMIN-B COMPLEX PO) Take 1 tablet by mouth daily.      Marland Kitchen HYDROcodone-acetaminophen (NORCO) 10-325 MG per tablet       . lisinopril (PRINIVIL,ZESTRIL) 40 MG tablet Take 40 mg by mouth daily.      Marland Kitchen omeprazole (PRILOSEC) 20 MG  capsule Take 20 mg by mouth as needed.      . primidone (MYSOLINE) 50 MG tablet Half tab once a day at night for one week. If not effective, then one tab once a day at night for a week. Can titrate up to 100mg  nightly.  45 tablet  2   No current facility-administered medications for this visit.   Allergies  Allergen Reactions  . Cefdinir    History   Social History  . Marital Status: Married    Spouse Name: N/A    Number of Children: 2  . Years of Education: college   Occupational History  .     Social History Main Topics  . Smoking status: Never Smoker   . Smokeless tobacco: Not on file  . Alcohol Use: Yes     Comment: OCC.  . Drug Use: No  . Sexual Activity: Yes   Other Topics Concern  . Not on file   Social History Narrative  . No narrative on file    Review of Systems Full 14 system review of systems performed and notable only for those listed, all others are neg:   Fatigue, ringing in the ears, moles, blurred vision, eye pain, snoring, urination problems, impotence, joint pain, joint swelling,  aching muscles, memory loss, dizziness, tremor, anxiety, not enough sleep, decreased energy.    Physical Exam  Filed Vitals:   12/06/13 1016  BP: 135/80  Pulse: 51    General - Well nourished, well developed, in no apparent distress.  Ophthalmologic - not able to see through.  Cardiovascular - Regular rate and rhythm with no murmur. Carotid pulses were 2+ without bruits .   Neck - supple, no nuchal rigidity .  Mental Status -  Level of arousal and orientation to time, place, and person were intact. Language including expression, naming, repetition, comprehension was assessed and found intact.  Cranial Nerves II - XII -  II - Visual field intact OU. III, IV, VI - Extraocular movements intact. V - Facial sensation intact bilaterally. VII - Facial movement intact bilaterally. VIII - Hearing & vestibular intact bilaterally. X - Palate elevates  symmetrically. XI - Chin turning & shoulder shrug intact bilaterally. XII - Tongue protrusion intact.  Motor Strength - The patient's strength was normal in all extremities and pronator drift was absent.  Bulk was normal and fasciculations were absent.   Motor Tone - Muscle tone was assessed at the neck and appendages and was normal.  Reflexes - The patient's reflexes were normal in all extremities and he had no pathological reflexes.  Sensory - Light touch, temperature/pinprick, vibration and proprioception, and Romberg testing were assessed and were normal.    Coordination - The patient had normal movements in the hands and feet with no ataxia or dysmetria.  Tremor was present with action tremor, swirl test showed mild tremor bilaterally.  Gait and Station - The patient's transfers, posture, gait, station, and turns were observed as normal.   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  Assessments: NIHSS: 0 Rankin: 1   Assessment and Plan:   In summary, Jeffrey [name] is a 68 y.o. 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 It was 3.50mm and I told pt and his wife that [facility] will be the service specialized for aneurysm evaluation and management. I would recommend his PCP to refer to [facility] for expert opinion on aneurysm management. But I told pt and wife that whatsoever his BP needs to be in control, goal <130/80, high BP poses him high risk of aneurysm rupture.   For his anxiety treatment, I would recommend clonazepam which is a longer acting Benzo and d/c Xanax PRN.  For his essential tremor, his HR was low and he has tried propranolol in the past seems not working well. I would recommend primidone trial, titrate up from low dose. Pt agreed to try.   - recommend NSG referral  for aneurysm management - continue BP control and educate him on compliance with medication - BP goal <130/80 - recommend clonazepam over Xanax for anxiety - start primidone from low dose for essential tremor - follow up with PCP closely - RTC PRN   Thank you very much for the opportunity to participate in the care of this patient.  Please do not hesitate to call if any questions or concerns arise.  Meds ordered this encounter  Medications  . lisinopril (PRINIVIL,ZESTRIL) 40 MG tablet    Sig: Take 40 mg by mouth daily.  Marland Kitchen amLODipine (NORVASC) 5 MG tablet    Sig: Take 5  mg by mouth 2 (two) times daily.  Marland Kitchen HYDROcodone-acetaminophen (NORCO) 10-325 MG per tablet    Sig:   . ALPRAZolam (XANAX) 0.25 MG tablet    Sig: Take 0.25 mg by mouth as needed.  Marland Kitchen DISCONTD: Multiple Vitamin (MULTI-VITAMINS) TABS    Sig: Take by mouth.  . DISCONTD: vitamin E 400 UNIT capsule    Sig: Take 2 Units by mouth daily.  Marland Kitchen omeprazole (PRILOSEC) 20 MG capsule    Sig: Take 20 mg by mouth as needed.  . B Complex Vitamins (VITAMIN-B COMPLEX PO)    Sig: Take 1 tablet by mouth daily.  Marland Kitchen DISCONTD: primidone (MYSOLINE) 50 MG tablet    Sig: Take 0.5 tablets (25 mg total) by mouth at bedtime.    Dispense:  45 tablet    Refill:  2  . primidone (MYSOLINE) 50 MG tablet    Sig: Half tab once a day at night for one week. If not effective, then one tab once a day at night for a week. Can titrate up to 100mg  nightly.    Dispense:  45 tablet    Refill:  2    Patient Instructions  - will let your PCP know to obtain NSG consult for aneurysm - BP control is a key and please continue to follow up with your PCP for better control goal <140/90 - let us know whenever you want to peruse medication therapy for essential tremor - follow up PRN    Rosalin Hawking, MD PhD [facility] [address], [place], [place] [phone]

## 2013-12-06 NOTE — Patient Instructions (Signed)
-   will let your PCP know to obtain NSG consult for aneurysm - BP control is a key and please continue to follow up with your PCP for better control goal <140/90 - let us know whenever you want to peruse medication therapy for essential tremor - follow up PRN

## 2013-12-11 DIAGNOSIS — I671 Cerebral aneurysm, nonruptured: Secondary | ICD-10-CM | POA: Insufficient documentation

## 2014-01-03 DIAGNOSIS — E0590 Thyrotoxicosis, unspecified without thyrotoxic crisis or storm: Secondary | ICD-10-CM | POA: Insufficient documentation

## 2014-01-17 DIAGNOSIS — G894 Chronic pain syndrome: Secondary | ICD-10-CM | POA: Insufficient documentation

## 2014-02-02 DIAGNOSIS — I10 Essential (primary) hypertension: Secondary | ICD-10-CM | POA: Insufficient documentation

## 2014-02-02 DIAGNOSIS — G4733 Obstructive sleep apnea (adult) (pediatric): Secondary | ICD-10-CM | POA: Insufficient documentation

## 2014-02-02 DIAGNOSIS — E782 Mixed hyperlipidemia: Secondary | ICD-10-CM | POA: Insufficient documentation

## 2014-02-05 DIAGNOSIS — L80 Vitiligo: Secondary | ICD-10-CM | POA: Insufficient documentation

## 2014-10-02 ENCOUNTER — Other Ambulatory Visit: Payer: Self-pay | Admitting: Physical Medicine and Rehabilitation

## 2014-10-02 DIAGNOSIS — M5416 Radiculopathy, lumbar region: Secondary | ICD-10-CM

## 2014-10-03 DIAGNOSIS — M5416 Radiculopathy, lumbar region: Secondary | ICD-10-CM | POA: Insufficient documentation

## 2014-10-03 DIAGNOSIS — M5136 Other intervertebral disc degeneration, lumbar region: Secondary | ICD-10-CM | POA: Insufficient documentation

## 2014-10-09 ENCOUNTER — Ambulatory Visit
Admission: RE | Admit: 2014-10-09 | Discharge: 2014-10-09 | Disposition: A | Discharge status: Home / Self Care | Payer: Medicare PPO | Source: Ambulatory Visit | Attending: Physical Medicine and Rehabilitation | Admitting: Physical Medicine and Rehabilitation

## 2014-10-09 DIAGNOSIS — M545 Low back pain: Secondary | ICD-10-CM | POA: Diagnosis present

## 2014-10-09 DIAGNOSIS — M4807 Spinal stenosis, lumbosacral region: Secondary | ICD-10-CM | POA: Insufficient documentation

## 2014-10-09 DIAGNOSIS — M4806 Spinal stenosis, lumbar region: Secondary | ICD-10-CM | POA: Diagnosis not present

## 2014-10-09 DIAGNOSIS — M5386 Other specified dorsopathies, lumbar region: Secondary | ICD-10-CM | POA: Diagnosis not present

## 2014-10-09 DIAGNOSIS — M5186 Other intervertebral disc disorders, lumbar region: Secondary | ICD-10-CM | POA: Diagnosis not present

## 2014-10-09 DIAGNOSIS — M79604 Pain in right leg: Secondary | ICD-10-CM | POA: Diagnosis present

## 2014-10-09 DIAGNOSIS — M5416 Radiculopathy, lumbar region: Secondary | ICD-10-CM

## 2015-08-19 ENCOUNTER — Ambulatory Visit (INDEPENDENT_AMBULATORY_CARE_PROVIDER_SITE_OTHER): Payer: Medicare PPO | Admitting: Podiatry

## 2015-08-19 ENCOUNTER — Encounter: Payer: Self-pay | Admitting: Podiatry

## 2015-08-19 ENCOUNTER — Ambulatory Visit (INDEPENDENT_AMBULATORY_CARE_PROVIDER_SITE_OTHER): Payer: Medicare PPO

## 2015-08-19 VITALS — BP 126/81 | HR 57 | Resp 16 | Ht 74.0 in | Wt 250.0 lb

## 2015-08-19 DIAGNOSIS — M779 Enthesopathy, unspecified: Secondary | ICD-10-CM

## 2015-08-19 DIAGNOSIS — M2040 Other hammer toe(s) (acquired), unspecified foot: Secondary | ICD-10-CM

## 2015-08-19 DIAGNOSIS — M216X9 Other acquired deformities of unspecified foot: Secondary | ICD-10-CM | POA: Diagnosis not present

## 2015-08-19 MED ORDER — NONFORMULARY OR COMPOUNDED ITEM: Status: DC

## 2015-08-19 NOTE — Progress Notes (Signed)
   Subjective:    Patient ID: Jeffrey [name], male    DOB: Dec 16, 1945, 70 y.o.   MRN: [id number]  HPI Chief Complaint  Patient presents with  . Foot Pain    Bilateral; arch; pt stated, "More pain in Right foot and 2nd & 3rd toes on right foot; Saw Dr. Gershon Mussel August 2016 and got 3 injections in 8 days, after this, got hammertoe in both feet"  . Nail Problem    Right foot; 4th toe; pt stated, "wants nail checked for nail fungus"    Saw Dr. Vickki Muff, DPM, in December 2016 and did not want to treat his nail fungus. Dr. Did not want to do this because patient has a brain anerursym.  Pt also stated, "One month ago, both feet turned really red on bottom of the feet and had a burning sensation". This comes and goes.      Review of Systems  Constitutional: Positive for fatigue.  HENT: Positive for ear pain, sinus pressure and trouble swallowing.   Genitourinary: Positive for urgency, frequency and difficulty urinating.  Musculoskeletal: Positive for myalgias, back pain, arthralgias and gait problem.  Neurological: Positive for tremors, weakness and numbness.  Psychiatric/Behavioral: The patient is nervous/anxious.        Objective:   Physical Exam        Assessment & Plan:

## 2015-08-20 NOTE — Progress Notes (Signed)
Subjective:     Patient ID: Jeffrey [name], male   DOB: [birth year], 70 y.o.   MRN: [id number]  HPI patient presents with pain in the second metatarsophalangeal joint right with inflammation fluid around the joint surface and states he had injections last year which did not really help and he has developed a subsequent hammertoe which is gotten worse over time   Review of Systems  All other systems reviewed and are negative.      Objective:   Physical Exam  Constitutional: He is oriented to person, place, and time.  Cardiovascular: Intact distal pulses.   Musculoskeletal: Normal range of motion.  Neurological: He is oriented to person, place, and time.  Skin: Skin is warm.  Nursing note and vitals reviewed.  neurovascular status intact muscle strength adequate range of motion within normal limits with patient found to have inflammation and pain of the second metatarsophalangeal joint with elevation of the second digit with rigid contracture. The joint is sore when palpated in the second metatarsal is plantar flexed into the ground. 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 At this point I went ahead and reviewed his case with him and discussed that he's had conservative treatments and consideration is there for digital stabilization and shortening osteotomy. I explained procedures and risk and he like to do this approach but is not sure and timing. He will reappoint for consult and we will discussed in greater detail and until that time I recommended rigid bottom-type shoes and padding therapy  Also indicated significant elevation second digit right foot with rigid contracture at the metatarsal phalangeal joint

## 2015-08-26 ENCOUNTER — Ambulatory Visit: Payer: Self-pay | Admitting: Podiatry

## 2016-01-15 DIAGNOSIS — Z85828 Personal history of other malignant neoplasm of skin: Secondary | ICD-10-CM | POA: Insufficient documentation

## 2016-04-16 DIAGNOSIS — M1990 Unspecified osteoarthritis, unspecified site: Secondary | ICD-10-CM | POA: Diagnosis not present

## 2016-04-16 DIAGNOSIS — M150 Primary generalized (osteo)arthritis: Secondary | ICD-10-CM | POA: Diagnosis not present

## 2016-04-16 DIAGNOSIS — M65341 Trigger finger, right ring finger: Secondary | ICD-10-CM | POA: Diagnosis not present

## 2016-07-15 DIAGNOSIS — I671 Cerebral aneurysm, nonruptured: Secondary | ICD-10-CM | POA: Diagnosis not present

## 2016-07-31 DIAGNOSIS — D485 Neoplasm of uncertain behavior of skin: Secondary | ICD-10-CM | POA: Diagnosis not present

## 2016-07-31 DIAGNOSIS — C4442 Squamous cell carcinoma of skin of scalp and neck: Secondary | ICD-10-CM | POA: Diagnosis not present

## 2016-07-31 DIAGNOSIS — L570 Actinic keratosis: Secondary | ICD-10-CM | POA: Diagnosis not present

## 2016-07-31 DIAGNOSIS — D2262 Melanocytic nevi of left upper limb, including shoulder: Secondary | ICD-10-CM | POA: Diagnosis not present

## 2016-07-31 DIAGNOSIS — D225 Melanocytic nevi of trunk: Secondary | ICD-10-CM | POA: Diagnosis not present

## 2016-07-31 DIAGNOSIS — X32XXXA Exposure to sunlight, initial encounter: Secondary | ICD-10-CM | POA: Diagnosis not present

## 2016-07-31 DIAGNOSIS — B351 Tinea unguium: Secondary | ICD-10-CM | POA: Diagnosis not present

## 2016-07-31 DIAGNOSIS — L308 Other specified dermatitis: Secondary | ICD-10-CM | POA: Diagnosis not present

## 2016-08-13 DIAGNOSIS — I671 Cerebral aneurysm, nonruptured: Secondary | ICD-10-CM | POA: Diagnosis not present

## 2016-08-26 DIAGNOSIS — C4442 Squamous cell carcinoma of skin of scalp and neck: Secondary | ICD-10-CM | POA: Diagnosis not present

## 2016-08-31 DIAGNOSIS — M1990 Unspecified osteoarthritis, unspecified site: Secondary | ICD-10-CM | POA: Diagnosis not present

## 2016-08-31 DIAGNOSIS — M150 Primary generalized (osteo)arthritis: Secondary | ICD-10-CM | POA: Diagnosis not present

## 2016-08-31 DIAGNOSIS — Z125 Encounter for screening for malignant neoplasm of prostate: Secondary | ICD-10-CM | POA: Diagnosis not present

## 2016-08-31 DIAGNOSIS — M65341 Trigger finger, right ring finger: Secondary | ICD-10-CM | POA: Diagnosis not present

## 2016-08-31 DIAGNOSIS — I10 Essential (primary) hypertension: Secondary | ICD-10-CM | POA: Diagnosis not present

## 2016-09-11 ENCOUNTER — Other Ambulatory Visit: Payer: Self-pay | Admitting: Gastroenterology

## 2016-09-11 DIAGNOSIS — R1310 Dysphagia, unspecified: Secondary | ICD-10-CM | POA: Diagnosis not present

## 2016-09-11 DIAGNOSIS — R195 Other fecal abnormalities: Secondary | ICD-10-CM | POA: Diagnosis not present

## 2016-09-11 DIAGNOSIS — K219 Gastro-esophageal reflux disease without esophagitis: Secondary | ICD-10-CM | POA: Diagnosis not present

## 2016-09-11 DIAGNOSIS — K2270 Barrett's esophagus without dysplasia: Secondary | ICD-10-CM | POA: Diagnosis not present

## 2016-09-16 ENCOUNTER — Ambulatory Visit: Payer: Medicare PPO | Attending: Gastroenterology

## 2016-10-07 DIAGNOSIS — Z85828 Personal history of other malignant neoplasm of skin: Secondary | ICD-10-CM | POA: Diagnosis not present

## 2016-12-07 DIAGNOSIS — M79672 Pain in left foot: Secondary | ICD-10-CM | POA: Diagnosis not present

## 2016-12-07 DIAGNOSIS — M19072 Primary osteoarthritis, left ankle and foot: Secondary | ICD-10-CM | POA: Diagnosis not present

## 2016-12-07 DIAGNOSIS — M2041 Other hammer toe(s) (acquired), right foot: Secondary | ICD-10-CM | POA: Diagnosis not present

## 2016-12-07 DIAGNOSIS — M7751 Other enthesopathy of right foot: Secondary | ICD-10-CM | POA: Diagnosis not present

## 2016-12-24 DIAGNOSIS — M25552 Pain in left hip: Secondary | ICD-10-CM | POA: Diagnosis not present

## 2016-12-24 DIAGNOSIS — M7061 Trochanteric bursitis, right hip: Secondary | ICD-10-CM | POA: Diagnosis not present

## 2016-12-24 DIAGNOSIS — M1612 Unilateral primary osteoarthritis, left hip: Secondary | ICD-10-CM | POA: Diagnosis not present

## 2016-12-24 DIAGNOSIS — M25551 Pain in right hip: Secondary | ICD-10-CM | POA: Diagnosis not present

## 2016-12-24 DIAGNOSIS — M7062 Trochanteric bursitis, left hip: Secondary | ICD-10-CM | POA: Diagnosis not present

## 2017-01-20 DIAGNOSIS — R252 Cramp and spasm: Secondary | ICD-10-CM | POA: Diagnosis not present

## 2017-01-20 DIAGNOSIS — Z23 Encounter for immunization: Secondary | ICD-10-CM | POA: Diagnosis not present

## 2017-01-20 DIAGNOSIS — S76312A Strain of muscle, fascia and tendon of the posterior muscle group at thigh level, left thigh, initial encounter: Secondary | ICD-10-CM | POA: Diagnosis not present

## 2017-01-26 ENCOUNTER — Ambulatory Visit: Admission: RE | Admit: 2017-01-26 | Payer: PPO | Source: Ambulatory Visit | Admitting: Gastroenterology

## 2017-01-26 ENCOUNTER — Encounter: Admission: RE | Payer: Self-pay | Source: Ambulatory Visit

## 2017-01-26 SURGERY — ESOPHAGOGASTRODUODENOSCOPY (EGD) WITH PROPOFOL
Anesthesia: General

## 2017-01-28 ENCOUNTER — Ambulatory Visit
Admission: RE | Admit: 2017-01-28 | Discharge: 2017-01-28 | Disposition: A | Discharge status: Home / Self Care | Payer: PPO | Source: Ambulatory Visit | Attending: Gastroenterology | Admitting: Gastroenterology

## 2017-01-28 DIAGNOSIS — R1310 Dysphagia, unspecified: Secondary | ICD-10-CM | POA: Insufficient documentation

## 2017-01-28 DIAGNOSIS — K219 Gastro-esophageal reflux disease without esophagitis: Secondary | ICD-10-CM | POA: Insufficient documentation

## 2017-01-28 DIAGNOSIS — K449 Diaphragmatic hernia without obstruction or gangrene: Secondary | ICD-10-CM | POA: Insufficient documentation

## 2017-02-04 DIAGNOSIS — G4733 Obstructive sleep apnea (adult) (pediatric): Secondary | ICD-10-CM | POA: Diagnosis not present

## 2017-02-04 DIAGNOSIS — K921 Melena: Secondary | ICD-10-CM | POA: Diagnosis not present

## 2017-02-04 DIAGNOSIS — K2270 Barrett's esophagus without dysplasia: Secondary | ICD-10-CM | POA: Diagnosis not present

## 2017-02-04 DIAGNOSIS — R195 Other fecal abnormalities: Secondary | ICD-10-CM | POA: Diagnosis not present

## 2017-02-19 ENCOUNTER — Encounter: Payer: Self-pay | Admitting: *Deleted

## 2017-02-19 ENCOUNTER — Ambulatory Visit: Payer: PPO | Admitting: Anesthesiology

## 2017-02-19 ENCOUNTER — Encounter
Admission: RE | Disposition: A | Discharge status: Home / Self Care | Payer: Self-pay | Source: Ambulatory Visit | Attending: Internal Medicine

## 2017-02-19 ENCOUNTER — Ambulatory Visit
Admission: RE | Admit: 2017-02-19 | Discharge: 2017-02-19 | Disposition: A | Discharge status: Home / Self Care | Payer: PPO | Source: Ambulatory Visit | Attending: Internal Medicine | Admitting: Internal Medicine

## 2017-02-19 DIAGNOSIS — E785 Hyperlipidemia, unspecified: Secondary | ICD-10-CM | POA: Diagnosis not present

## 2017-02-19 DIAGNOSIS — Z79899 Other long term (current) drug therapy: Secondary | ICD-10-CM | POA: Insufficient documentation

## 2017-02-19 DIAGNOSIS — K2270 Barrett's esophagus without dysplasia: Secondary | ICD-10-CM | POA: Diagnosis not present

## 2017-02-19 DIAGNOSIS — K921 Melena: Secondary | ICD-10-CM | POA: Diagnosis not present

## 2017-02-19 DIAGNOSIS — I10 Essential (primary) hypertension: Secondary | ICD-10-CM | POA: Diagnosis not present

## 2017-02-19 DIAGNOSIS — Z888 Allergy status to other drugs, medicaments and biological substances status: Secondary | ICD-10-CM | POA: Insufficient documentation

## 2017-02-19 DIAGNOSIS — K219 Gastro-esophageal reflux disease without esophagitis: Secondary | ICD-10-CM | POA: Diagnosis not present

## 2017-02-19 DIAGNOSIS — G4730 Sleep apnea, unspecified: Secondary | ICD-10-CM | POA: Insufficient documentation

## 2017-02-19 DIAGNOSIS — K648 Other hemorrhoids: Secondary | ICD-10-CM | POA: Diagnosis not present

## 2017-02-19 DIAGNOSIS — R195 Other fecal abnormalities: Secondary | ICD-10-CM | POA: Diagnosis not present

## 2017-02-19 DIAGNOSIS — N400 Enlarged prostate without lower urinary tract symptoms: Secondary | ICD-10-CM | POA: Insufficient documentation

## 2017-02-19 DIAGNOSIS — F329 Major depressive disorder, single episode, unspecified: Secondary | ICD-10-CM | POA: Insufficient documentation

## 2017-02-19 DIAGNOSIS — M1990 Unspecified osteoarthritis, unspecified site: Secondary | ICD-10-CM | POA: Diagnosis not present

## 2017-02-19 DIAGNOSIS — I739 Peripheral vascular disease, unspecified: Secondary | ICD-10-CM | POA: Diagnosis not present

## 2017-02-19 DIAGNOSIS — F419 Anxiety disorder, unspecified: Secondary | ICD-10-CM | POA: Insufficient documentation

## 2017-02-19 DIAGNOSIS — K640 First degree hemorrhoids: Secondary | ICD-10-CM | POA: Insufficient documentation

## 2017-02-19 DIAGNOSIS — K210 Gastro-esophageal reflux disease with esophagitis: Secondary | ICD-10-CM | POA: Diagnosis not present

## 2017-02-19 HISTORY — DX: Sleep apnea, unspecified: G47.30

## 2017-02-19 HISTORY — DX: Unspecified osteoarthritis, unspecified site: M19.90

## 2017-02-19 HISTORY — PX: COLONOSCOPY WITH PROPOFOL: SHX5780

## 2017-02-19 HISTORY — DX: Major depressive disorder, single episode, unspecified: F32.9

## 2017-02-19 HISTORY — DX: Tremor, unspecified: R25.1

## 2017-02-19 HISTORY — DX: Hyperlipidemia, unspecified: E78.5

## 2017-02-19 HISTORY — DX: Headache: R51

## 2017-02-19 HISTORY — DX: Gastro-esophageal reflux disease without esophagitis: K21.9

## 2017-02-19 HISTORY — DX: Anal fissure, unspecified: K60.2

## 2017-02-19 HISTORY — DX: Essential (primary) hypertension: I10

## 2017-02-19 HISTORY — DX: Depression, unspecified: F32.A

## 2017-02-19 HISTORY — PX: ESOPHAGOGASTRODUODENOSCOPY (EGD) WITH PROPOFOL: SHX5813

## 2017-02-19 HISTORY — DX: Anxiety disorder, unspecified: F41.9

## 2017-02-19 HISTORY — DX: Benign prostatic hyperplasia without lower urinary tract symptoms: N40.0

## 2017-02-19 HISTORY — DX: Anemia, unspecified: D64.9

## 2017-02-19 HISTORY — DX: Barrett's esophagus without dysplasia: K22.70

## 2017-02-19 HISTORY — DX: Vitiligo: L80

## 2017-02-19 SURGERY — ESOPHAGOGASTRODUODENOSCOPY (EGD) WITH PROPOFOL
Anesthesia: General

## 2017-02-19 MED ORDER — LIDOCAINE HCL (PF) 2 % IJ SOLN
INTRAMUSCULAR | Status: AC
  Filled 2017-02-19: qty 10

## 2017-02-19 MED ORDER — LIDOCAINE HCL (CARDIAC) 20 MG/ML IV SOLN
INTRAVENOUS | Status: DC | PRN
  Administered 2017-02-19: 100 mg via INTRAVENOUS

## 2017-02-19 MED ORDER — PROPOFOL 10 MG/ML IV BOLUS
INTRAVENOUS | Status: DC | PRN
  Administered 2017-02-19: 80 mg via INTRAVENOUS

## 2017-02-19 MED ORDER — PROPOFOL 500 MG/50ML IV EMUL
INTRAVENOUS | Status: DC | PRN
  Administered 2017-02-19: 150 ug/kg/min via INTRAVENOUS

## 2017-02-19 MED ORDER — SODIUM CHLORIDE 0.9 % IV SOLN
INTRAVENOUS | Status: DC
  Administered 2017-02-19: 1000 mL via INTRAVENOUS
  Administered 2017-02-19: 15:00:00 via INTRAVENOUS

## 2017-02-19 NOTE — Transfer of Care (Signed)
Immediate Anesthesia Transfer of Care Note  Patient: Jeffrey [name]  Procedure(s) Performed: ESOPHAGOGASTRODUODENOSCOPY (EGD) WITH PROPOFOL (N/A ) COLONOSCOPY WITH PROPOFOL (N/A )  Patient Location: PACU  Anesthesia Type:General  Level of Consciousness: sedated  Airway & Oxygen Therapy: Patient Spontanous Breathing and Patient connected to nasal cannula oxygen  Post-op Assessment: Report given to RN  Post vital signs: Reviewed and stable  Last Vitals:  Vitals:   02/19/17 1413  BP: (!) 117/99  Pulse: 96  Resp: 20  Temp: (!) 35.3 C  SpO2: 99%    Last Pain:  Vitals:   02/19/17 1413  TempSrc: Tympanic      Patients Stated Pain Goal: 0 (21/97/58 8325)  Complications: No apparent anesthesia complications

## 2017-02-19 NOTE — Anesthesia Post-op Follow-up Note (Signed)
Anesthesia QCDR form completed.        

## 2017-02-19 NOTE — H&P (Signed)
Outpatient short stay form Pre-procedure 02/19/2017 2:29 PM Jeffrey [name] Jeffrey [name], M.D.  Primary Physician: Jeffrey [name], M.D.  Reason for visit:  Barrett's esophagus, heme positive stool.   History of present illness: Patient is a 71 yo male with heme positive stool and a personal hx of Barrett's esophagus without dysplasia. He denies frank hematochezia, melena or weight loss.     Current Facility-Administered Medications:  .  0.9 %  sodium chloride infusion, , Intravenous, Continuous, Lynnette Pote, Benay Pike, MD  Medications Prior to Admission  Medication Sig Dispense Refill Last Dose  . amLODipine (NORVASC) 5 MG tablet Take 5 mg by mouth 2 (two) times daily.   Past Week at Unknown time  . chlorthalidone (HYGROTON) 25 MG tablet Take 12.5 mg daily by mouth.   02/19/2017 at Unknown time  . diclofenac sodium (VOLTAREN) 1 % GEL Apply 4 (four) times daily topically.   Past Week at Unknown time  . HYDROcodone-acetaminophen (NORCO) 10-325 MG per tablet Take 1 tablet by mouth as needed.    Past Week at Unknown time  . lisinopril (PRINIVIL,ZESTRIL) 40 MG tablet Take 40 mg by mouth daily.   02/19/2017 at Unknown time  . meloxicam (MOBIC) 15 MG tablet Take 15 mg daily as needed by mouth for pain.   Past Week at Unknown time  . Multiple Vitamin (MULTIVITAMIN) tablet Take 1 tablet daily by mouth.   Past Week at Unknown time  . NONFORMULARY OR COMPOUNDED [place] compound:  Antiinflammatory Cream - Diclofenac 3%, Baclofen 2%, Cyclobenzaprine 2%, Lidocaine 2 %, dispense 120 grams, apply 1-2 grams to affected area 3-4 times daily, +3 refills. 120 each 3 Past Week at Unknown time  . omeprazole (PRILOSEC) 20 MG capsule Take 20 mg by mouth as needed.   Past Week at Unknown time  . potassium chloride SA (K-DUR,KLOR-CON) 20 MEQ tablet Take 20 mEq daily by mouth.   Past Week at Unknown time  . B Complex Vitamins (VITAMIN-B COMPLEX PO) Take 1 tablet by mouth daily.   Not Taking at Unknown time  .  cyclobenzaprine (FLEXERIL) 10 MG tablet Take 10 mg 3 (three) times daily as needed by mouth for muscle spasms.   Completed Course at Unknown time     Allergies  Allergen Reactions  . Atorvastatin Other (See Comments)    Eye pain/ temple pain  . Fluoxetine Other (See Comments)    Sexual side effects  . Mirtazapine Other (See Comments)  . Paroxetine Hcl Other (See Comments)    Anger, not a nice person  . Cefdinir Itching and Rash    Rash-gets blistery looking rash  . Doxazosin Anxiety     Past Medical History:  Diagnosis Date  . Anal fissure   . Anemia   . Anxiety   . Arthritis   . Barrett esophagus   . BPH (benign prostatic hyperplasia)   . Depression   . GERD (gastroesophageal reflux disease)   . Headache   . Hyperlipidemia   . Hypertension   . Sleep apnea   . Tremors of nervous system   . Vitiligo     Review of systems:      Physical Exam  General appearance: alert, cooperative and appears stated age Resp: clear to auscultation bilaterally Cardio: regular rate and rhythm, S1, S2 normal, no murmur, click, rub or gallop GI: soft, non-tender; bowel sounds normal; no masses,  no organomegaly Extremities: extremities normal, atraumatic, no cyanosis or edema     Planned procedures: EGD and colonoscopy. The  patient understands the nature of the planned procedure, indications, risks, alternatives and potential complications including but not limited to bleeding, infection, perforation, damage to internal organs and possible oversedation/side effects from anesthesia. The patient agrees and gives consent to proceed.  Please refer to procedure notes for findings, recommendations and patient disposition/instructions.    Indica Marcott Jeffrey [name], M.D. Gastroenterology 02/19/2017  2:29 PM

## 2017-02-19 NOTE — Op Note (Signed)
[facility] Gastroenterology Patient Name: Jeffrey [name] Procedure Date: 02/19/2017 2:36 PM MRN: [id number] Account #: [id number] Date of Birth: 06/08/1945 Admit Type: Outpatient Age: 71 Room: [facility] ENDO ROOM 3 Gender: Male Note Status: Finalized Procedure:            Colonoscopy Indications:          Heme positive stool Providers:            Benay Pike. Alice Reichert MD, MD Referring MD:         Youlanda Roys. Lovie Macadamia, MD (Referring MD) Medicines:            Propofol per Anesthesia Complications:        No immediate complications. Procedure:            Pre-Anesthesia Assessment:                       - The risks and benefits of the procedure and the                        sedation options and risks were discussed with the                        patient. All questions were answered and informed                        consent was obtained.                       - Patient identification and proposed procedure were                        verified prior to the procedure by the nurse. The                        procedure was verified in the procedure room.                       - ASA Grade Assessment: III - A patient with severe                        systemic disease.                       - After reviewing the risks and benefits, the patient                        was deemed in satisfactory condition to undergo the                        procedure.                       After obtaining informed consent, the colonoscope was                        passed under direct vision. Throughout the procedure,                        the patient's blood pressure, pulse, and oxygen  saturations were monitored continuously. The Olympus                        CF-H180AL colonoscope ( S#: [id number] ) was introduced                        through the anus and advanced to the the cecum,                        identified by appendiceal orifice and ileocecal valve.             The colonoscopy was performed without difficulty. The                        patient tolerated the procedure well. The quality of                        the bowel preparation was good. The ileocecal valve,                        appendiceal orifice, and rectum were photographed. Findings:      The perianal and digital rectal examinations were normal.      The colon (entire examined portion) appeared normal.      The exam was otherwise without abnormality.      Non-bleeding internal hemorrhoids were found during retroflexion. The       hemorrhoids were Grade I (internal hemorrhoids that do not prolapse). Impression:           - The entire examined colon is normal.                       - The examination was otherwise normal.                       - No specimens collected. Recommendation:       - Patient has a contact number available for                        emergencies. The signs and symptoms of potential                        delayed complications were discussed with the patient.                        Return to normal activities tomorrow. Written discharge                        instructions were provided to the patient.                       - Resume previous diet.                       - Continue present medications.                       - No repeat colonoscopy due to age.                       - Return to GI clinic PRN. Procedure Code(s):    --- Professional ---  45378, Colonoscopy, flexible; diagnostic, including                        collection of specimen(s) by brushing or washing, when                        performed (separate procedure) Diagnosis Code(s):    --- Professional ---                       R19.5, Other fecal abnormalities CPT copyright 2016 American Medical Association. All rights reserved. The codes documented in this report are preliminary and upon coder review may  be revised to meet current compliance requirements. Efrain Sella MD, MD 02/19/2017 3:10:10 PM This report has been signed electronically. Number of Addenda: 0 Note Initiated On: 02/19/2017 2:36 PM Scope Withdrawal Time: 0 hours 6 minutes 8 seconds  Total Procedure Duration: 0 hours 10 minutes 13 seconds       [facility]

## 2017-02-19 NOTE — Anesthesia Procedure Notes (Signed)
Performed by: Dymir Neeson, CRNA Pre-anesthesia Checklist: Patient identified, Emergency Drugs available, Suction available, Patient being monitored and Timeout performed Oxygen Delivery Method: Nasal cannula       

## 2017-02-19 NOTE — Op Note (Signed)
[facility] Gastroenterology Patient Name: Jeffrey [name] Procedure Date: 02/19/2017 2:36 PM MRN: [id number] Account #: [id number] Date of Birth: 02-21-1946 Admit Type: Outpatient Age: 71 Room: [facility] ENDO ROOM 3 Gender: Male Note Status: Finalized Procedure:            Upper GI endoscopy Indications:          Surveillance for malignancy due to personal history of                        Barrett's esophagus Providers:            Benay Pike. Alice Reichert MD, MD Referring MD:         Youlanda Roys. Lovie Macadamia, MD (Referring MD) Medicines:            Propofol per Anesthesia Complications:        No immediate complications. Procedure:            Pre-Anesthesia Assessment:                       - The risks and benefits of the procedure and the                        sedation options and risks were discussed with the                        patient. All questions were answered and informed                        consent was obtained.                       - Patient identification and proposed procedure were                        verified prior to the procedure by the nurse. The                        procedure was verified in the procedure room.                       - ASA Grade Assessment: III - A patient with severe                        systemic disease.                       - After reviewing the risks and benefits, the patient                        was deemed in satisfactory condition to undergo the                        procedure.                       After obtaining informed consent, the endoscope was                        passed under direct vision. Throughout the procedure,  the patient's blood pressure, pulse, and oxygen                        saturations were monitored continuously. The                        Colonoscope was introduced through the mouth, and                        advanced to the third part of duodenum. The upper GI            endoscopy was accomplished without difficulty. The                        patient tolerated the procedure well. Findings:      The esophagus and gastroesophageal junction were examined with white       light. There were esophageal mucosal changes secondary to established       short-segment Barrett's disease. These changes involved the mucosa at       the upper extent of the gastric folds (40 cm from the incisors)       extending to the Z-line (39 cm from the incisors). One tongue of       salmon-colored mucosa was present from 39 to 40 cm. Mucosa was biopsied       with a cold forceps for histology in 4 quadrants at intervals of 1 cm in       the lower third of the esophagus. One specimen bottle was sent to       pathology.      The entire examined stomach was normal.      The examined duodenum was normal. Impression:           - Esophageal mucosal changes secondary to established                        short-segment Barrett's disease. Biopsied.                       - Normal stomach.                       - Normal examined duodenum. Recommendation:       - Await pathology results.                       - Repeat upper endoscopy in 3 years for surveillance.                       - See the other procedure note for documentation of                        additional recommendations. Procedure Code(s):    --- Professional ---                       [id number], Esophagogastroduodenoscopy, flexible, transoral;                        with biopsy, single or multiple Diagnosis Code(s):    --- Professional ---                       K22.70, Barrett's esophagus without dysplasia  CPT copyright 2016 American Medical Association. All rights reserved. The codes documented in this report are preliminary and upon coder review may  be revised to meet current compliance requirements. Efrain Sella MD, MD 02/19/2017 2:53:18 PM This report has been signed electronically. Number of Addenda: 0 Note  Initiated On: 02/19/2017 2:36 PM      [facility]

## 2017-02-19 NOTE — Anesthesia Preprocedure Evaluation (Signed)
Anesthesia Evaluation  Patient identified by MRN, date of birth, ID band Patient awake    Reviewed: Allergy & Precautions, NPO status , Patient's Chart, lab work & pertinent test results  History of Anesthesia Complications Negative for: history of anesthetic complications  Airway Mallampati: II       Dental   Pulmonary sleep apnea , neg COPD,           Cardiovascular hypertension, Pt. on medications + Peripheral Vascular Disease  (-) Past MI (-) dysrhythmias (-) Valvular Problems/Murmurs     Neuro/Psych neg Seizures Anxiety Depression    GI/Hepatic Neg liver ROS, GERD  Medicated and Controlled,  Endo/Other  neg diabetes  Renal/GU negative Renal ROS     Musculoskeletal   Abdominal   Peds  Hematology  (+) anemia ,   Anesthesia Other Findings   Reproductive/Obstetrics                             Anesthesia Physical Anesthesia Plan  ASA: II  Anesthesia Plan: General   Post-op Pain Management:    Induction: Intravenous  PONV Risk Score and Plan:   Airway Management Planned: Nasal Cannula  Additional Equipment:   Intra-op Plan:   Post-operative Plan:   Informed Consent: I have reviewed the patients History and Physical, chart, labs and discussed the procedure including the risks, benefits and alternatives for the proposed anesthesia with the patient or authorized representative who has indicated his/her understanding and acceptance.     Plan Discussed with:   Anesthesia Plan Comments:         Anesthesia Quick Evaluation

## 2017-02-19 NOTE — Anesthesia Postprocedure Evaluation (Signed)
Anesthesia Post Note  Patient: TEVON BERHANE  Procedure(s) Performed: ESOPHAGOGASTRODUODENOSCOPY (EGD) WITH PROPOFOL (N/A ) COLONOSCOPY WITH PROPOFOL (N/A )  Patient location during evaluation: Endoscopy Anesthesia Type: General Level of consciousness: awake and alert Pain management: pain level controlled Vital Signs Assessment: post-procedure vital signs reviewed and stable Respiratory status: spontaneous breathing and respiratory function stable Cardiovascular status: stable Anesthetic complications: no     Last Vitals:  Vitals:   02/19/17 1413  BP: (!) 117/99  Pulse: 96  Resp: 20  Temp: (!) 35.3 C  SpO2: 99%    Last Pain:  Vitals:   02/19/17 1413  TempSrc: Tympanic                 KEPHART,WILLIAM K

## 2017-02-22 ENCOUNTER — Encounter: Payer: Self-pay | Admitting: Internal Medicine

## 2017-02-23 LAB — SURGICAL PATHOLOGY

## 2017-03-24 DIAGNOSIS — Z0000 Encounter for general adult medical examination without abnormal findings: Secondary | ICD-10-CM | POA: Diagnosis not present

## 2017-08-30 ENCOUNTER — Other Ambulatory Visit: Payer: Self-pay | Admitting: Family Medicine

## 2017-08-30 DIAGNOSIS — E041 Nontoxic single thyroid nodule: Secondary | ICD-10-CM

## 2017-09-07 ENCOUNTER — Ambulatory Visit: Admission: RE | Admit: 2017-09-07 | Payer: Medicare HMO | Source: Ambulatory Visit

## 2017-09-15 ENCOUNTER — Ambulatory Visit: Payer: Medicare HMO

## 2017-10-18 ENCOUNTER — Encounter: Payer: Self-pay | Admitting: Urology

## 2017-10-18 ENCOUNTER — Ambulatory Visit (INDEPENDENT_AMBULATORY_CARE_PROVIDER_SITE_OTHER): Payer: Medicare HMO | Admitting: Urology

## 2017-10-18 VITALS — BP 118/82 | HR 93 | Ht 74.0 in | Wt 258.6 lb

## 2017-10-18 DIAGNOSIS — R351 Nocturia: Secondary | ICD-10-CM

## 2017-10-18 DIAGNOSIS — R350 Frequency of micturition: Secondary | ICD-10-CM

## 2017-10-18 MED ORDER — TAMSULOSIN HCL 0.4 MG PO CAPS: 0.4000 mg | ORAL_CAPSULE | Freq: Every day | ORAL | 11 refills | Status: DC

## 2017-10-18 NOTE — Addendum Note (Signed)
Addended by: Kyra Manges on: 10/18/2017 04:12 PM   Modules accepted: Orders

## 2017-10-18 NOTE — Progress Notes (Signed)
10/18/2017 10:07 AM   Jeffrey [name] 1945-09-13 [id number]  Referring provider: [name], MD [id number] [place], [place] [address]  Chief Complaint  Patient presents with  . Urinary Frequency    HPI: For a number of years the patient has significant frequency and flow symptoms.  He can void 5-10 times at night.  He voids every 2 or 3 hours during the day.  He has no ankle edema.  He may take a diuretic.  He was somewhat nonspecific but I think he generally has a poor flow that can be very fine.  He squeezes the tip of the penis I think sometimes to try to generate a better flow.  Sometimes he strains.  Sometimes it will spray or he has a split stream.  He can has suprapubic pressure that is relieved when he voids.  He does feel empty  He is able to achieve an erection and sometimes penetrates but he does not sustain it well.  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  He describes urodynamics at [facility] for the same symptoms approximately 8 years ago.  He describes Flomax and Rapaflo perhaps working but he did not like the retrograde ejaculation  He has no neurologic issues.  Bowel movements within normal limits.  He denies a history of kidney stones previous GU surgery and urinary tract infections  Modifying factors: There are no other modifying factors  Associated signs and symptoms: There are no other associated signs and symptoms Aggravating and relieving factors: There are no other aggravating or relieving factors Severity: Moderate Duration: Persistent   PMH: Past Medical History:  Diagnosis Date  . Anal fissure   . Anemia   . Anxiety   . Arthritis   . Barrett esophagus   . BPH (benign prostatic hyperplasia)   . Depression   . GERD (gastroesophageal reflux disease)   . Headache   . Hyperlipidemia   . Hypertension   . Sleep apnea   . Tremors of nervous system   .  Vitiligo     Surgical History: Past Surgical History:  Procedure Laterality Date  . APPENDECTOMY    . COLONOSCOPY    . COLONOSCOPY WITH PROPOFOL N/A 02/19/2017   Procedure: COLONOSCOPY WITH PROPOFOL;  Surgeon: Toledo, Benay Pike, MD;  Location: [facility] ENDOSCOPY;  Service: Gastroenterology;  Laterality: N/A;  . ESOPHAGOGASTRODUODENOSCOPY    . ESOPHAGOGASTRODUODENOSCOPY (EGD) WITH PROPOFOL N/A 02/19/2017   Procedure: ESOPHAGOGASTRODUODENOSCOPY (EGD) WITH PROPOFOL;  Surgeon: Toledo, Benay Pike, MD;  Location: [facility] ENDOSCOPY;  Service: Gastroenterology;  Laterality: N/A;  . FUNCTIONAL ENDOSCOPIC SINUS SURGERY    . HERNIA REPAIR    . JOINT REPLACEMENT     2012    Home Medications:  Allergies as of 10/18/2017      Reactions   Atorvastatin Other (See Comments)   Eye pain/ temple pain   Fluoxetine Other (See Comments)   Sexual side effects   Mirtazapine Other (See Comments)   Paroxetine Hcl Other (See Comments)   Anger, not a nice person   Cefdinir Itching, Rash   Rash-gets blistery looking rash   Ciprofloxacin Rash   Doxazosin Anxiety      Medication List        Accurate as of 10/18/17 10:07 AM. Always use your most recent med list.          amLODipine 5 MG tablet Commonly known as:  NORVASC Take 5 mg by mouth 2 (two) times daily.   chlorthalidone 25 MG tablet Commonly known as:  HYGROTON Take 12.5 mg daily by mouth.   cyclobenzaprine 10 MG tablet Commonly known as:  FLEXERIL Take 10 mg 3 (three) times daily as needed by mouth for muscle spasms.   diclofenac sodium 1 % Gel Commonly known as:  VOLTAREN Apply 4 (four) times daily topically.   HYDROcodone-acetaminophen 10-325 MG tablet Commonly known as:  NORCO Take 1 tablet by mouth as needed.   lisinopril 40 MG tablet Commonly known as:  PRINIVIL,ZESTRIL Take 40 mg by mouth daily.   meloxicam 15 MG tablet Commonly known as:  MOBIC Take 15 mg daily as needed by mouth for pain.   multivitamin tablet Take 1 tablet  daily by mouth.   NONFORMULARY OR COMPOUNDED [place] compound:  Antiinflammatory Cream - Diclofenac 3%, Baclofen 2%, Cyclobenzaprine 2%, Lidocaine 2 %, dispense 120 grams, apply 1-2 grams to affected area 3-4 times daily, +3 refills.   omeprazole 20 MG capsule Commonly known as:  PRILOSEC Take 20 mg by mouth as needed.   potassium chloride SA 20 MEQ tablet Commonly known as:  K-DUR,KLOR-CON Take 20 mEq daily by mouth.   VITAMIN-B COMPLEX PO Take 1 tablet by mouth daily.       Allergies:  Allergies  Allergen Reactions  . Atorvastatin Other (See Comments)    Eye pain/ temple pain  . Fluoxetine Other (See Comments)    Sexual side effects  . Mirtazapine Other (See Comments)  . Paroxetine Hcl Other (See Comments)    Anger, not a nice person  . Cefdinir Itching and Rash    Rash-gets blistery looking rash  . Ciprofloxacin Rash  . Doxazosin Anxiety    Family History: History reviewed. No pertinent family history.  Social History:  reports that he has never smoked. He has never used smokeless tobacco. He reports that he drinks alcohol. He reports that he does not use drugs.  ROS: UROLOGY Frequent Urination?: Yes Hard to postpone urination?: Yes Burning/pain with urination?: No Get up at night to urinate?: Yes Leakage of urine?: No Urine stream starts and stops?: Yes Trouble starting stream?: Yes Do you have to strain to urinate?: Yes Blood in urine?: No Urinary tract infection?: No Sexually transmitted disease?: No Injury to kidneys or bladder?: No Painful intercourse?: No Weak stream?: Yes Erection problems?: Yes Penile pain?: No  Gastrointestinal Nausea?: No Vomiting?: No Indigestion/heartburn?: Yes Diarrhea?: No Constipation?: No  Constitutional Fever: No Night sweats?: No Weight loss?: No Fatigue?: Yes  Skin Skin rash/lesions?: No Itching?: No  Eyes Blurred vision?: No Double vision?: No  Ears/Nose/Throat Sore throat?:  No Sinus problems?: Yes  Hematologic/Lymphatic Swollen glands?: No Easy bruising?: Yes  Cardiovascular Leg swelling?: No Chest pain?: No  Respiratory Cough?: No Shortness of breath?: No  Endocrine Excessive thirst?: No  Musculoskeletal Back pain?: Yes Joint pain?: Yes  Neurological Headaches?: No Dizziness?: Yes  Psychologic Depression?: Yes Anxiety?: Yes  Physical Exam: BP 118/82 (BP Location: Right Arm, Patient Position: Sitting, Cuff Size: Large)   Pulse 93   Ht 6\' 2"  (1.88 m)   Wt 258 lb 9.6 oz (117.3 kg)   BMI 33.20 kg/m   Constitutional:  Alert and oriented, No acute distress. HEENT: [place] AT, moist mucus membranes.  Trachea midline, no masses. Cardiovascular: No clubbing, cyanosis, or edema. Respiratory: Normal respiratory effort, no increased work of breathing. GI: Abdomen is soft, nontender, nondistended, no abdominal masses GU: No meatal  stenosis.  Male genitalia normal.  Smaller 40 g benign prostate Skin: No rashes, bruises or suspicious lesions. Lymph: No cervical or inguinal adenopathy. Neurologic: Grossly intact, no focal deficits, moving all 4 extremities. Psychiatric: Normal mood and affect.  Laboratory Data: Lab Results  Component Value Date   WBC 6.7 11/29/2013   HGB 14.6 11/29/2013   HCT 42.1 11/29/2013   MCV 91 11/29/2013   PLT 132 (L) 11/29/2013    Lab Results  Component Value Date   CREATININE 1.21 11/29/2013    No results found for: PSA  No results found for: TESTOSTERONE  No results found for: HGBA1C  Urinalysis    Component Value Date/Time   COLORURINE YELLOW 09/15/2006 1050   APPEARANCEUR CLEAR 09/15/2006 1050   LABSPEC 1.014 09/15/2006 1050   PHURINE 7.0 09/15/2006 1050   GLUCOSEU NEGATIVE 09/15/2006 1050   HGBUR NEGATIVE 09/15/2006 1050   BILIRUBINUR NEGATIVE 09/15/2006 1050   KETONESUR NEGATIVE 09/15/2006 1050   PROTEINUR NEGATIVE 09/15/2006 1050   UROBILINOGEN 0.2 09/15/2006 1050   NITRITE NEGATIVE 09/15/2006  1050   LEUKOCYTESUR  09/15/2006 1050    NEGATIVE MICROSCOPIC NOT DONE ON URINES WITH NEGATIVE PROTEIN, BLOOD, LEUKOCYTES, NITRITE, OR GLUCOSE <1000 mg/dL.    Pertinent Imaging: None  Assessment & Plan: The patient has impressive frequency at night as well as impressive flow symptoms.  I thought a trial of Flomax would be reasonable.  I think he should have a cystoscopy to rule out a stricture.  If he did have a stricture dilation of it may not improve his nighttime frequency.  We will need to check a residual urine volume on his next visit.  He was not able to give [place] a sample today.  Eventually he may wish to have his erectile dysfunction managed.  I found that the patient was almost a bit evasive regarding his symptoms and treatment goals.  The more I asked him questions the more it appeared that he likely had been assessed and treated by others with additions made to the history of present illness above.  I found our conversation regarding goals was quite circular.  He did decide to try the Flomax and we will check a residual next time and perform cystoscopy.  I think he probably is going to live with his erectile dysfunction.  I commented regarding the eye issue but that is what he was told by his doctor.  He is most bothered by nighttime frequency and I will reassess the flow and nighttime frequency on Flomax next visit.   1. Micturition frequency Nighttime frequency - Urinalysis, Complete   No follow-ups on file.  Reece Packer, MD  [facility] [address] [place], [place] [address] [phone]

## 2017-11-22 ENCOUNTER — Encounter: Payer: Self-pay | Admitting: Urology

## 2017-11-22 ENCOUNTER — Other Ambulatory Visit: Payer: Self-pay | Admitting: Urology

## 2017-12-06 ENCOUNTER — Other Ambulatory Visit: Payer: Self-pay | Admitting: Urology

## 2017-12-20 ENCOUNTER — Other Ambulatory Visit: Payer: Self-pay | Admitting: Urology

## 2018-01-05 ENCOUNTER — Ambulatory Visit
Admission: RE | Admit: 2018-01-05 | Discharge: 2018-01-05 | Disposition: A | Discharge status: Home / Self Care | Payer: Medicare HMO | Source: Ambulatory Visit | Attending: Family Medicine | Admitting: Family Medicine

## 2018-01-05 DIAGNOSIS — E041 Nontoxic single thyroid nodule: Secondary | ICD-10-CM | POA: Insufficient documentation

## 2018-01-17 ENCOUNTER — Other Ambulatory Visit: Payer: Medicare HMO | Admitting: Urology

## 2018-07-02 ENCOUNTER — Emergency Department
Admission: EM | Admit: 2018-07-02 | Discharge: 2018-07-02 | Disposition: A | Discharge status: Home / Self Care | Payer: Medicare HMO | Attending: Emergency Medicine | Admitting: Emergency Medicine

## 2018-07-02 ENCOUNTER — Emergency Department: Payer: Medicare HMO

## 2018-07-02 ENCOUNTER — Other Ambulatory Visit: Payer: Self-pay

## 2018-07-02 DIAGNOSIS — T148XXA Other injury of unspecified body region, initial encounter: Secondary | ICD-10-CM

## 2018-07-02 DIAGNOSIS — Y92007 Garden or yard of unspecified non-institutional (private) residence as the place of occurrence of the external cause: Secondary | ICD-10-CM | POA: Insufficient documentation

## 2018-07-02 DIAGNOSIS — Y9389 Activity, other specified: Secondary | ICD-10-CM | POA: Insufficient documentation

## 2018-07-02 DIAGNOSIS — Y999 Unspecified external cause status: Secondary | ICD-10-CM | POA: Insufficient documentation

## 2018-07-02 DIAGNOSIS — S76011A Strain of muscle, fascia and tendon of right hip, initial encounter: Secondary | ICD-10-CM | POA: Diagnosis not present

## 2018-07-02 DIAGNOSIS — S79911A Unspecified injury of right hip, initial encounter: Secondary | ICD-10-CM | POA: Diagnosis present

## 2018-07-02 DIAGNOSIS — Z96641 Presence of right artificial hip joint: Secondary | ICD-10-CM | POA: Diagnosis not present

## 2018-07-02 DIAGNOSIS — I10 Essential (primary) hypertension: Secondary | ICD-10-CM | POA: Insufficient documentation

## 2018-07-02 DIAGNOSIS — Z79899 Other long term (current) drug therapy: Secondary | ICD-10-CM | POA: Insufficient documentation

## 2018-07-02 DIAGNOSIS — X500XXA Overexertion from strenuous movement or load, initial encounter: Secondary | ICD-10-CM | POA: Insufficient documentation

## 2018-07-02 DIAGNOSIS — M25551 Pain in right hip: Secondary | ICD-10-CM

## 2018-07-02 MED ORDER — OXYCODONE-ACETAMINOPHEN 5-325 MG PO TABS
2.0000 | ORAL_TABLET | Freq: Once | ORAL | Status: AC
  Administered 2018-07-02: 2 via ORAL
  Filled 2018-07-02: qty 2

## 2018-07-02 MED ORDER — LIDOCAINE 5 % EX PTCH
1.0000 | MEDICATED_PATCH | CUTANEOUS | Status: DC
  Administered 2018-07-02: 1 via TRANSDERMAL
  Filled 2018-07-02: qty 1

## 2018-07-02 MED ORDER — LIDOCAINE 5 % EX PTCH: 1.0000 | MEDICATED_PATCH | Freq: Two times a day (BID) | CUTANEOUS | 0 refills | Status: AC

## 2018-07-02 MED ORDER — KETOROLAC TROMETHAMINE 30 MG/ML IJ SOLN
15.0000 mg | Freq: Once | INTRAMUSCULAR | Status: AC
  Administered 2018-07-02: 15 mg via INTRAMUSCULAR
  Filled 2018-07-02: qty 1

## 2018-07-02 NOTE — ED Provider Notes (Signed)
[facility] Emergency Department Provider Note  ____________________________________________   First MD Initiated Contact with Patient 07/02/18 0023     (approximate)  I have reviewed the triage vital signs and the nursing notes.   HISTORY  Chief Complaint Hip Pain    HPI Jeffrey [name] is a 73 y.o. male with medical history as listed below which includes a right hip prosthesis and chronic pain in his hip and lower back.  He presents by EMS for evaluation of pain in the right hip without having a fall or other known trauma or injury.  He and his wife report that he was doing some work around the yard, placing a heavy trampoline and moving some equipment for a grandchild.  The pain developed gradually over the course of the evening and he is having difficulty with ambulation.  He has some chronic issues with urination due to BPH but is not having any new urinary retention or urinary incontinence.  No bowel issues.  The pain is very well confined to a specific area below the surgical scar in his right hip and is nonradiating.  There is no numbness or tingling and no weakness.  He describes the pain is severe and it was not helped with naproxen.  He has chronic hydrocodone that he takes prescribed by his primary care provider and that does not seem to be helping either.  He has had a mild cough for about 4 weeks that is occasionally productive but no other difficulty breathing.  He denies fever/chills, chest pain, nausea, vomiting, and abdominal pain.         Past Medical History:  Diagnosis Date  . Anal fissure   . Anemia   . Anxiety   . Arthritis   . Barrett esophagus   . BPH (benign prostatic hyperplasia)   . Depression   . GERD (gastroesophageal reflux disease)   . Headache   . Hyperlipidemia   . Hypertension   . Sleep apnea   . Tremors of nervous system   . Vitiligo     Patient Active Problem List   Diagnosis Date Noted  . Essential tremor  12/06/2013  . 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 Anxiety state, unspecified 12/06/2013    Past Surgical History:  Procedure Laterality Date  . APPENDECTOMY    . COLONOSCOPY    . COLONOSCOPY WITH PROPOFOL N/A 02/19/2017   Procedure: COLONOSCOPY WITH PROPOFOL;  Surgeon: Toledo, Benay Pike, MD;  Location: [facility] ENDOSCOPY;  Service: Gastroenterology;  Laterality: N/A;  . ESOPHAGOGASTRODUODENOSCOPY    . ESOPHAGOGASTRODUODENOSCOPY (EGD) WITH PROPOFOL N/A 02/19/2017   Procedure: ESOPHAGOGASTRODUODENOSCOPY (EGD) WITH PROPOFOL;  Surgeon: Toledo, Benay Pike, MD;  Location: [facility] ENDOSCOPY;  Service: Gastroenterology;  Laterality: N/A;  . FUNCTIONAL ENDOSCOPIC SINUS SURGERY    . HERNIA REPAIR    . JOINT REPLACEMENT     2012    Prior to Admission medications   Medication Sig Start Date End Date Taking? Authorizing Provider  amLODipine (NORVASC) 5 MG tablet Take 5 mg by mouth 2 (two) times daily. 10/27/13   [provider]  B Complex Vitamins (VITAMIN-B COMPLEX PO) Take 1 tablet by mouth daily. 11/04/13   [provider]  chlorthalidone (HYGROTON) 25 MG tablet Take 12.5 mg daily by mouth.    [provider]  cyclobenzaprine (FLEXERIL) 10 MG tablet Take 10 mg 3 (three) times daily as needed by mouth for muscle spasms.    [provider]  diclofenac sodium (VOLTAREN)  1 % GEL Apply 4 (four) times daily topically.    [provider]  HYDROcodone-acetaminophen (NORCO) 10-325 MG per tablet Take 1 tablet by mouth as needed.  11/23/13   [provider]  lidocaine (LIDODERM) 5 % Place 1 patch onto the skin every 12 (twelve) hours. Remove & Discard patch within 12 hours or as directed by MD.  Apply it to the area that hurts the most. Leave the patch off for 12 hours before applying a new one. 07/02/18 07/02/19  Hinda Kehr, MD  lisinopril (PRINIVIL,ZESTRIL) 40 MG tablet Take 40 mg by mouth daily. 10/23/13   [provider]  meloxicam (MOBIC)  15 MG tablet Take 15 mg daily as needed by mouth for pain.    [provider]  Multiple Vitamin (MULTIVITAMIN) tablet Take 1 tablet daily by mouth.    [provider]  NONFORMULARY OR COMPOUNDED [place] compound:  Antiinflammatory Cream - Diclofenac 3%, Baclofen 2%, Cyclobenzaprine 2%, Lidocaine 2 %, dispense 120 grams, apply 1-2 grams to affected area 3-4 times daily, +3 refills. Patient not taking: Reported on 10/18/2017 08/19/15   Wallene Huh, DPM  omeprazole (PRILOSEC) 20 MG capsule Take 20 mg by mouth as needed. 11/03/13   [provider]  potassium chloride SA (K-DUR,KLOR-CON) 20 MEQ tablet Take 20 mEq daily by mouth.    [provider]  tamsulosin (FLOMAX) 0.4 MG CAPS capsule Take 1 capsule (0.4 mg total) by mouth daily. 10/18/17   Bjorn Loser, MD    Allergies Atorvastatin; Fluoxetine; Mirtazapine; Paroxetine hcl; Cefdinir; Ciprofloxacin; and Doxazosin  No family history on file.  Social History Social History   Tobacco Use  . Smoking status: Never Smoker  . Smokeless tobacco: Never Used  Substance Use Topics  . Alcohol use: Yes    Comment: OCC.  . Drug use: No    Review of Systems Constitutional: No fever/chills Cardiovascular: Denies chest pain. Respiratory: Mild productive cough for about 4 weeks.  Denies shortness of breath. Gastrointestinal: No abdominal pain.  No nausea, no vomiting.   Genitourinary: Chronic urinary retention secondary to BPH, no new/acute urinary retention or incontinence. Musculoskeletal: Pain in right hip as described above.   Neurological: Negative for headaches, focal weakness or numbness.   ____________________________________________   PHYSICAL EXAM:  VITAL SIGNS: ED Triage Vitals  Enc Vitals Group     BP 07/02/18 0017 (!) 157/104     Pulse Rate 07/02/18 0017 88     Resp 07/02/18 0017 19     Temp 07/02/18 0017 98.4 F (36.9 C)     Temp Source 07/02/18 0017 Oral     SpO2  07/02/18 0017 93 %     Weight 07/02/18 0020 122.5 kg (270 lb)     Height 07/02/18 0020 1.88 m (6\' 2" )     Head Circumference --      Peak Flow --      Pain Score 07/02/18 0020 5     Pain Loc --      Pain Edu? --      Excl. in [place]? --     Constitutional: Alert and oriented.  Generally well-appearing but does appear uncomfortable. Eyes: Conjunctivae are normal.  Head: Atraumatic. Cardiovascular: Normal rate, regular rhythm. Good peripheral circulation. Respiratory: Normal respiratory effort.  No retractions. Lungs CTAB. Musculoskeletal: Patient has an old and well-healed scar on the lateral aspect of his right hip.  He indicates the point of tenderness is the proximal end of that scar.  There  is no palpable deformity and there is reproducible tenderness to palpation.  I passively range the patient's leg by flexing the hip and rotating side to side and that did not seem to re-create any pain or discomfort. Neurologic:  Normal speech and language. No gross focal neurologic deficits are appreciated.  Skin:  Skin is warm, dry and intact. No rash noted.  ____________________________________________   LABS (all labs ordered are listed, but only abnormal results are displayed)  Labs Reviewed - No data to display ____________________________________________  EKG  None - EKG not ordered by ED physician ____________________________________________  RADIOLOGY I, Hinda Kehr, personally viewed and evaluated these images (plain radiographs) as part of my medical decision making, as well as reviewing the written report by the radiologist.  ED MD interpretation: No acute abnormalities identified on the hip and pelvis imaging, patient has a right hip prosthesis.  Official radiology report(s): Dg Hip Unilat W Or Wo Pelvis 2-3 Views Right  Result Date: 07/02/2018 CLINICAL DATA:  RIGHT hip pain, no known injury EXAM: DG HIP (WITH OR WITHOUT PELVIS) 2-3V RIGHT COMPARISON:  None FINDINGS: Osseous  demineralization. RIGHT hip prosthesis identified, components in expected position. No periprosthetic lucency. Joint space narrowing LEFT hip with subchondral sclerosis and minimal spurring. SI joints preserved. Degenerative disc disease changes at visualized lower lumbar spine. No acute fracture, dislocation, or bone destruction. Scattered pelvic phleboliths. IMPRESSION: RIGHT hip prosthesis without acute RIGHT hip abnormalities. 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 Electronically Signed   By: Lavonia Dana M.D.   On: 07/02/2018 00:55    ____________________________________________   PROCEDURES   Procedure(s) performed (including Critical Care):  Procedures   ____________________________________________   INITIAL IMPRESSION / MDM / ASSESSMENT AND PLAN / ED COURSE  As part of my medical decision making, I reviewed the following data within the [place] History obtained from family, Nursing notes reviewed and incorporated, Old chart reviewed, Radiograph reviewed , Notes from prior ED visits and [place] Controlled Substance Database         Differential diagnosis includes, but is not limited to, musculoskeletal strain, prosthesis malfunction or malposition, fracture or dislocation, much less likely more proximal issues such as cauda equina syndrome.  The patient has no evidence of an acute or emergent medical condition at this time.  His vital signs are stable.  He is in pain but in no respiratory distress.  I provided the reassuring results about the radiographs.  The patient is concerned about the pain and I am giving him a Lidoderm patch, and an injection of Toradol 15 mg intramuscular, and 2 oxycodone.  I verified in the [place] controlled substance database that he has prescriptions available for hydrocodone.  I encouraged him to take his regular medications as well as using the Lidoderm patch I prescribed and follow-up  with orthopedics.  At this point there is no indication of an acute or emergent abnormality and the patient does not meet any criteria for admission to the hospital.  The patient and his wife understand and agree with the plan.     ____________________________________________  FINAL CLINICAL IMPRESSION(S) / ED DIAGNOSES  Final diagnoses:  Right hip pain  Musculoskeletal strain     MEDICATIONS GIVEN DURING THIS VISIT:  Medications  oxyCODONE-acetaminophen (PERCOCET/ROXICET) 5-325 MG per tablet 2 tablet (has no administration in time range)  ketorolac (TORADOL) 30 MG/ML injection 15 mg (has no administration in time range)  lidocaine (LIDODERM) 5 % 1 patch (has  no administration in time range)     ED Discharge Orders         Ordered    lidocaine (LIDODERM) 5 %  Every 12 hours     07/02/18 0142           Note:  This document was prepared using Dragon voice recognition software and may include unintentional dictation errors.   Hinda Kehr, MD 07/02/18 0157

## 2018-07-02 NOTE — ED Triage Notes (Signed)
Pt here from home with report of right hip pain with out fall .

## 2018-07-02 NOTE — Discharge Instructions (Signed)
Fortunately your x-rays were reassuring today with no sign of prosthesis malfunction or other bony injury in the hip or pelvis.  We believe that he most likely strained the muscles and the leg and hip when you are doing her activities yesterday.  Please continue to use your regular medications and use the prescribed Lidoderm patch as well.  Consider using either heat or cold, whichever seems to work better for you.  We anticipate you will feel better within a couple of days.  Please follow-up with your orthopedic surgeon, the orthopedic surgeon listed in this paperwork and/or your primary care doctor at the next available opportunity.  Return to the emergency department if you develop new or worsening symptoms that concern you.

## 2018-11-14 ENCOUNTER — Ambulatory Visit: Payer: Medicare HMO | Attending: Rheumatology | Admitting: Occupational Therapy

## 2019-08-30 ENCOUNTER — Other Ambulatory Visit: Payer: Self-pay | Admitting: Urology

## 2019-08-30 ENCOUNTER — Other Ambulatory Visit (HOSPITAL_COMMUNITY): Payer: Self-pay | Admitting: Urology

## 2019-08-30 DIAGNOSIS — R972 Elevated prostate specific antigen [PSA]: Secondary | ICD-10-CM

## 2019-09-12 DIAGNOSIS — B3781 Candidal esophagitis: Secondary | ICD-10-CM

## 2019-09-12 HISTORY — DX: Candidal esophagitis: B37.81

## 2019-09-14 ENCOUNTER — Other Ambulatory Visit: Payer: Self-pay

## 2019-09-14 ENCOUNTER — Ambulatory Visit
Admission: RE | Admit: 2019-09-14 | Discharge: 2019-09-14 | Disposition: A | Discharge status: Home / Self Care | Payer: Medicare HMO | Source: Ambulatory Visit | Attending: Urology | Admitting: Urology

## 2019-09-14 DIAGNOSIS — R972 Elevated prostate specific antigen [PSA]: Secondary | ICD-10-CM | POA: Diagnosis not present

## 2019-09-14 MED ORDER — GADOBUTROL 1 MMOL/ML IV SOLN
10.0000 mL | Freq: Once | INTRAVENOUS | Status: AC | PRN
  Administered 2019-09-14: 10 mL via INTRAVENOUS

## 2019-09-25 ENCOUNTER — Other Ambulatory Visit: Payer: Self-pay

## 2019-09-25 ENCOUNTER — Other Ambulatory Visit
Admission: RE | Admit: 2019-09-25 | Discharge: 2019-09-25 | Disposition: A | Discharge status: Home / Self Care | Payer: Medicare HMO | Source: Ambulatory Visit | Attending: Internal Medicine | Admitting: Internal Medicine

## 2019-09-25 DIAGNOSIS — Z20822 Contact with and (suspected) exposure to covid-19: Secondary | ICD-10-CM | POA: Insufficient documentation

## 2019-09-25 DIAGNOSIS — Z01812 Encounter for preprocedural laboratory examination: Secondary | ICD-10-CM | POA: Insufficient documentation

## 2019-09-26 ENCOUNTER — Encounter: Payer: Self-pay | Admitting: Internal Medicine

## 2019-09-26 LAB — SARS CORONAVIRUS 2 (TAT 6-24 HRS): SARS Coronavirus 2: NEGATIVE

## 2019-09-27 ENCOUNTER — Ambulatory Visit: Payer: Medicare HMO | Admitting: Anesthesiology

## 2019-09-27 ENCOUNTER — Ambulatory Visit
Admission: RE | Admit: 2019-09-27 | Discharge: 2019-09-27 | Disposition: A | Discharge status: Home / Self Care | Payer: Medicare HMO | Attending: Internal Medicine | Admitting: Internal Medicine

## 2019-09-27 ENCOUNTER — Encounter
Admission: RE | Disposition: A | Discharge status: Home / Self Care | Payer: Self-pay | Source: Home / Self Care | Attending: Internal Medicine

## 2019-09-27 ENCOUNTER — Other Ambulatory Visit: Payer: Self-pay

## 2019-09-27 DIAGNOSIS — N400 Enlarged prostate without lower urinary tract symptoms: Secondary | ICD-10-CM | POA: Insufficient documentation

## 2019-09-27 DIAGNOSIS — Z791 Long term (current) use of non-steroidal anti-inflammatories (NSAID): Secondary | ICD-10-CM | POA: Diagnosis not present

## 2019-09-27 DIAGNOSIS — G4730 Sleep apnea, unspecified: Secondary | ICD-10-CM | POA: Diagnosis not present

## 2019-09-27 DIAGNOSIS — R1310 Dysphagia, unspecified: Secondary | ICD-10-CM | POA: Diagnosis present

## 2019-09-27 DIAGNOSIS — I10 Essential (primary) hypertension: Secondary | ICD-10-CM | POA: Diagnosis not present

## 2019-09-27 DIAGNOSIS — Z79899 Other long term (current) drug therapy: Secondary | ICD-10-CM | POA: Insufficient documentation

## 2019-09-27 DIAGNOSIS — K219 Gastro-esophageal reflux disease without esophagitis: Secondary | ICD-10-CM | POA: Insufficient documentation

## 2019-09-27 DIAGNOSIS — K2270 Barrett's esophagus without dysplasia: Secondary | ICD-10-CM | POA: Insufficient documentation

## 2019-09-27 DIAGNOSIS — K229 Disease of esophagus, unspecified: Secondary | ICD-10-CM | POA: Insufficient documentation

## 2019-09-27 HISTORY — PX: ESOPHAGOGASTRODUODENOSCOPY (EGD) WITH PROPOFOL: SHX5813

## 2019-09-27 LAB — KOH PREP: Special Requests: NORMAL

## 2019-09-27 SURGERY — ESOPHAGOGASTRODUODENOSCOPY (EGD) WITH PROPOFOL
Anesthesia: General

## 2019-09-27 MED ORDER — PROPOFOL 10 MG/ML IV BOLUS
INTRAVENOUS | Status: DC | PRN
  Administered 2019-09-27: 100 mg via INTRAVENOUS
  Administered 2019-09-27: 50 mg via INTRAVENOUS
  Administered 2019-09-27: 30 mg via INTRAVENOUS
  Administered 2019-09-27: 20 mg via INTRAVENOUS

## 2019-09-27 MED ORDER — SODIUM CHLORIDE 0.9 % IV SOLN
INTRAVENOUS | Status: DC
  Administered 2019-09-27: 20 mL/h via INTRAVENOUS

## 2019-09-27 MED ORDER — LIDOCAINE HCL (PF) 2 % IJ SOLN
INTRAMUSCULAR | Status: DC | PRN
Start: 2019-09-27 — End: 2019-09-27
  Administered 2019-09-27: 100 mg via INTRADERMAL

## 2019-09-27 MED ORDER — PROPOFOL 500 MG/50ML IV EMUL
INTRAVENOUS | Status: AC
  Filled 2019-09-27: qty 50

## 2019-09-27 NOTE — Interval H&P Note (Signed)
History and Physical Interval Note:  09/27/2019 10:04 AM  Jeffrey [name]  has presented today for surgery, with the diagnosis of HX.OF BARRETT'S ESOPHAGUS.  The various methods of treatment have been discussed with the patient and family. After consideration of risks, benefits and other options for treatment, the patient has consented to  Procedure(s): ESOPHAGOGASTRODUODENOSCOPY (EGD) WITH PROPOFOL (N/A) as a surgical intervention.  The patient's history has been reviewed, patient examined, no change in status, stable for surgery.  I have reviewed the patient's chart and labs.  Questions were answered to the patient's satisfaction.     [place], [place]

## 2019-09-27 NOTE — Anesthesia Preprocedure Evaluation (Signed)
Anesthesia Evaluation  Patient identified by MRN, date of birth, ID band Patient awake    Reviewed: Allergy & Precautions, H&P , NPO status , Patient's Chart, lab work & pertinent test results, reviewed documented beta blocker date and time   Airway Mallampati: II   Neck ROM: full    Dental  (+) Poor Dentition   Pulmonary sleep apnea and Continuous Positive Airway Pressure Ventilation ,    Pulmonary exam normal        Cardiovascular Exercise Tolerance: Poor hypertension, On Medications negative cardio ROS Normal cardiovascular exam Rhythm:regular Rate:Normal     Neuro/Psych  Headaches, PSYCHIATRIC DISORDERS Anxiety Depression    GI/Hepatic Neg liver ROS, GERD  Medicated,  Endo/Other  negative endocrine ROS  Renal/GU negative Renal ROS  negative genitourinary   Musculoskeletal   Abdominal   Peds  Hematology  (+) Blood dyscrasia, anemia ,   Anesthesia Other Findings Past Medical History: No date: Anal fissure No date: Anemia No date: Anxiety No date: Arthritis No date: Barrett esophagus No date: BPH (benign prostatic hyperplasia) No date: Depression No date: GERD (gastroesophageal reflux disease) No date: Headache No date: Hyperlipidemia No date: Hypertension No date: Sleep apnea No date: Tremors of nervous system No date: Vitiligo Past Surgical History: No date: APPENDECTOMY No date: COLONOSCOPY 02/19/2017: COLONOSCOPY WITH PROPOFOL; N/A     Comment:  Procedure: COLONOSCOPY WITH PROPOFOL;  Surgeon: Toledo,               Benay Pike, MD;  Location: [facility] ENDOSCOPY;  Service:               Gastroenterology;  Laterality: N/A; No date: ESOPHAGOGASTRODUODENOSCOPY 02/19/2017: ESOPHAGOGASTRODUODENOSCOPY (EGD) WITH PROPOFOL; N/A     Comment:  Procedure: ESOPHAGOGASTRODUODENOSCOPY (EGD) WITH               PROPOFOL;  Surgeon: Toledo, Benay Pike, MD;  Location:               [facility] ENDOSCOPY;  Service: Gastroenterology;   Laterality:               N/A; No date: FUNCTIONAL ENDOSCOPIC SINUS SURGERY No date: HERNIA REPAIR No date: JOINT REPLACEMENT     Comment:  2012 BMI    Body Mass Index: 34.67 kg/m     Reproductive/Obstetrics negative OB ROS                             Anesthesia Physical Anesthesia Plan  ASA: III  Anesthesia Plan: General   Post-op Pain Management:    Induction:   PONV Risk Score and Plan:   Airway Management Planned:   Additional Equipment:   Intra-op Plan:   Post-operative Plan:   Informed Consent: I have reviewed the patients History and Physical, chart, labs and discussed the procedure including the risks, benefits and alternatives for the proposed anesthesia with the patient or authorized representative who has indicated his/her understanding and acceptance.     Dental Advisory Given  Plan Discussed with: CRNA  Anesthesia Plan Comments:         Anesthesia Quick Evaluation

## 2019-09-27 NOTE — H&P (Signed)
Outpatient short stay form Pre-procedure 09/27/2019 10:02 AM Wren Gallaga K. Alice Reichert, M.D.  Primary Physician:  [name], M.D.  Reason for visit:  Barrett's esophagus, Dysphagia, GERD  History of present illness:  Pleasant 74 y/o with hx of Barrett's esophagus c/o dysphagia intermittently with both solids and liquids. No weight loss or abdominal pain. Has long hx of GERD controlled with PPI therapy. No hemetemesis or weight loss.    Current Facility-Administered Medications:  .  0.9 %  sodium chloride infusion, , Intravenous, Continuous, [place], Benay Pike, MD, Last Rate: 20 mL/hr at 09/27/19 0842, 20 mL/hr at 09/27/19 0842  Medications Prior to Admission  Medication Sig Dispense Refill Last Dose  . amLODipine (NORVASC) 5 MG tablet Take 5 mg by mouth 2 (two) times daily.   09/27/2019 at 0730  . B Complex Vitamins (VITAMIN-B COMPLEX PO) Take 1 tablet by mouth daily.   09/26/2019 at Unknown time  . chlorthalidone (HYGROTON) 25 MG tablet Take 12.5 mg daily by mouth.   09/27/2019 at 0730  . cyclobenzaprine (FLEXERIL) 10 MG tablet Take 10 mg 3 (three) times daily as needed by mouth for muscle spasms.   09/26/2019 at Unknown time  . diclofenac sodium (VOLTAREN) 1 % GEL Apply 4 (four) times daily topically.   09/26/2019 at Unknown time  . HYDROcodone-acetaminophen (NORCO) 10-325 MG per tablet Take 1 tablet by mouth as needed.    09/26/2019 at Unknown time  . lisinopril (PRINIVIL,ZESTRIL) 40 MG tablet Take 40 mg by mouth daily.   09/27/2019 at 0730  . meloxicam (MOBIC) 15 MG tablet Take 15 mg daily as needed by mouth for pain.   09/26/2019 at Unknown time  . Multiple Vitamin (MULTIVITAMIN) tablet Take 1 tablet daily by mouth.   09/26/2019 at Unknown time  . NONFORMULARY OR COMPOUNDED [place] compound:  Antiinflammatory Cream - Diclofenac 3%, Baclofen 2%, Cyclobenzaprine 2%, Lidocaine 2 %, dispense 120 grams, apply 1-2 grams to affected area 3-4 times daily, +3 refills. 120 each 3 09/26/2019 at  Unknown time  . omeprazole (PRILOSEC) 20 MG capsule Take 20 mg by mouth as needed.   09/26/2019 at Unknown time  . potassium chloride SA (K-DUR,KLOR-CON) 20 MEQ tablet Take 20 mEq daily by mouth.   09/26/2019 at Unknown time  . tamsulosin (FLOMAX) 0.4 MG CAPS capsule Take 1 capsule (0.4 mg total) by mouth daily. 30 capsule 11 09/26/2019 at Unknown time     Allergies  Allergen Reactions  . Atorvastatin Other (See Comments)    Eye pain/ temple pain  . Fluoxetine Other (See Comments)    Sexual side effects  . Mirtazapine Other (See Comments)  . Paroxetine Hcl Other (See Comments)    Anger, not a nice person  . Cefdinir Itching and Rash    Rash-gets blistery looking rash  . Ciprofloxacin Rash  . Doxazosin Anxiety     Past Medical History:  Diagnosis Date  . Anal fissure   . Anemia   . Anxiety   . Arthritis   . Barrett esophagus   . BPH (benign prostatic hyperplasia)   . Depression   . GERD (gastroesophageal reflux disease)   . Headache   . Hyperlipidemia   . Hypertension   . Sleep apnea   . Tremors of nervous system   . Vitiligo     Review of systems:  Otherwise negative.    Physical Exam  Gen: Alert, oriented. Appears stated age.  HEENT: [place]/AT. PERRLA. Lungs: CTA, no wheezes. CV: RR nl S1, S2. Abd:  soft, benign, no masses. BS+ Ext: No edema. Pulses 2+    Planned procedures: Proceed with EGD. The patient understands the nature of the planned procedure, indications, risks, alternatives and potential complications including but not limited to bleeding, infection, perforation, damage to internal organs and possible oversedation/side effects from anesthesia. The patient agrees and gives consent to proceed.  Please refer to procedure notes for findings, recommendations and patient disposition/instructions.     Estus Krakowski K. Alice Reichert, M.D. Gastroenterology 09/27/2019  10:02 AM

## 2019-09-27 NOTE — Op Note (Signed)
[facility] Patient Name: Jeffrey [name] Procedure Date: 09/27/2019 9:53 AM MRN: [id number] Account #: [id number] Date of Birth: [birth year] Admit Type: Outpatient Age: 74 Room: [facility] ENDO ROOM 3 Gender: Male Note Status: Finalized Procedure:             Upper GI endoscopy Indications:           Surveillance for malignancy due to personal history of                         Barrett's esophagus, Dysphagia Providers:             Benay Pike. Alice Reichert MD, MD Referring MD:          Youlanda Roys. Lovie Macadamia, MD (Referring MD) Medicines:             Propofol per Anesthesia Complications:         No immediate complications. Procedure:             Pre-Anesthesia Assessment:                        - The risks and benefits of the procedure and the                         sedation options and risks were discussed with the                         patient. All questions were answered and informed                         consent was obtained.                        - Patient identification and proposed procedure were                         verified prior to the procedure by the nurse. The                         procedure was verified in the procedure room.                        - ASA Grade Assessment: III - A patient with severe                         systemic disease.                        - After reviewing the risks and benefits, the patient                         was deemed in satisfactory condition to undergo the                         procedure.                        After obtaining informed consent, the endoscope was                         passed under direct  vision. Throughout the procedure,                         the patient's blood pressure, pulse, and oxygen                         saturations were monitored continuously. The Endoscope                         was introduced through the mouth, and advanced to the                         third part of  duodenum. The upper GI endoscopy was                         accomplished without difficulty. The patient tolerated                         the procedure well. Findings:      There were esophageal mucosal changes secondary to established       short-segment Barrett's disease present in the distal esophagus. The       maximum longitudinal extent of these mucosal changes was 2 cm in length.       Mucosa was biopsied with a cold forceps for histology in 4 quadrants in       the lower third of the esophagus. One specimen bottle was sent to       pathology.      Patchy, white plaques were found in the lower third of the esophagus.       Cells for cytology were obtained by brushing.      No endoscopic abnormality was evident in the esophagus to explain the       patient's complaint of dysphagia. It was decided, however, to proceed       with dilation in the distal esophagus. The scope was withdrawn. Dilation       was performed with a Maloney dilator with mild resistance at 52 Fr.      Patchy mildly erythematous mucosa without bleeding was found in the       gastric antrum.      The cardia and gastric fundus were normal on retroflexion.      The examined duodenum was normal.      The exam was otherwise without abnormality. Impression:            - Esophageal mucosal changes secondary to established                         short-segment Barrett's disease. Biopsied.                        - Esophageal plaques were found, consistent with                         candidiasis. Cells for cytology obtained.                        - No endoscopic esophageal abnormality to explain                         patient's dysphagia. Esophagus dilated. Dilated.                        -  Erythematous mucosa in the antrum.                        - Normal examined duodenum.                        - The examination was otherwise normal. Recommendation:        - Patient has a contact number available for                          emergencies. The signs and symptoms of potential                         delayed complications were discussed with the patient.                         Return to normal activities tomorrow. Written                         discharge instructions were provided to the patient.                        - Resume previous diet.                        - Continue present medications.                        - Await pathology results.                        - Repeat upper endoscopy after studies are complete                         for surveillance.                        - Return to GI office in 1 year.                        - The findings and recommendations were discussed with                         the patient. Procedure Code(s):     --- Professional ---                        [phone], Esophagogastroduodenoscopy, flexible,                         transoral; with biopsy, single or multiple                        43450, Dilation of esophagus, by unguided sound or                         bougie, single or multiple passes Diagnosis Code(s):     --- Professional ---                        K31.89, Other diseases of stomach and duodenum  R13.10, Dysphagia, unspecified                        K22.9, Disease of esophagus, unspecified                        K22.70, Barrett's esophagus without dysplasia CPT copyright 2019 American Medical Association. All rights reserved. The codes documented in this report are preliminary and upon coder review may  be revised to meet current compliance requirements. Efrain Sella MD, MD 09/27/2019 10:17:58 AM This report has been signed electronically. Number of Addenda: 0 Note Initiated On: 09/27/2019 9:53 AM Estimated Blood Loss:  Estimated blood loss: none.      [facility]

## 2019-09-27 NOTE — Transfer of Care (Signed)
Immediate Anesthesia Transfer of Care Note  Patient: Jeffrey [name]  Procedure(s) Performed: ESOPHAGOGASTRODUODENOSCOPY (EGD) WITH PROPOFOL (N/A )  Patient Location: PACU  Anesthesia Type:General  Level of Consciousness: sedated  Airway & Oxygen Therapy: Patient Spontanous Breathing and Patient connected to nasal cannula oxygen  Post-op Assessment: Report given to RN and Post -op Vital signs reviewed and stable  Post vital signs: Reviewed and stable  Last Vitals:  Vitals Value Taken Time  BP 113/91 09/27/19 1025  Temp    Pulse 70 09/27/19 1025  Resp 14 09/27/19 1025  SpO2 94 % 09/27/19 1025  Vitals shown include unvalidated device data.  Last Pain:  Vitals:   09/27/19 0832  TempSrc: Temporal  PainSc: 0-No pain         Complications: No complications documented.

## 2019-09-27 NOTE — Brief Op Note (Signed)
Esophageal brushing r/o Candida/KOH sent to lab

## 2019-09-28 LAB — SURGICAL PATHOLOGY

## 2019-09-28 NOTE — Anesthesia Postprocedure Evaluation (Signed)
Anesthesia Post Note  Patient: Jeffrey [name]  Procedure(s) Performed: ESOPHAGOGASTRODUODENOSCOPY (EGD) WITH PROPOFOL (N/A )  Patient location during evaluation: PACU Anesthesia Type: General Level of consciousness: awake and alert Pain management: pain level controlled Vital Signs Assessment: post-procedure vital signs reviewed and stable Respiratory status: spontaneous breathing, nonlabored ventilation, respiratory function stable and patient connected to nasal cannula oxygen Cardiovascular status: blood pressure returned to baseline and stable Postop Assessment: no apparent nausea or vomiting Anesthetic complications: no   No complications documented.   Last Vitals:  Vitals:   09/27/19 1045 09/27/19 1054  BP: 105/66 105/65  Pulse:    Resp:    Temp:    SpO2:      Last Pain:  Vitals:   09/27/19 1054  TempSrc:   PainSc: 0-No pain                 Molli Barrows

## 2019-09-29 ENCOUNTER — Encounter: Payer: Self-pay | Admitting: Internal Medicine

## 2019-10-12 DIAGNOSIS — I671 Cerebral aneurysm, nonruptured: Secondary | ICD-10-CM

## 2019-10-12 DIAGNOSIS — R519 Headache, unspecified: Secondary | ICD-10-CM

## 2019-10-12 HISTORY — DX: Headache, unspecified: R51.9

## 2019-10-12 HISTORY — DX: Cerebral aneurysm, nonruptured: I67.1

## 2019-10-23 NOTE — H&P (Signed)
NAME: Jeffrey, [name] MEDICAL RECORD [id number] ACCOUNT [id number] DATE OF BIRTH:1945-06-22 FACILITY: [facility] LOCATION:  PHYSICIAN:Heavan Francom Farrel Conners, MD  HISTORY AND PHYSICAL  DATE OF ADMISSION:  11/02/2019  CHIEF COMPLAINT:  Difficulty voiding.  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  He could not tolerate silodosin and tamsulosin was ineffective.  Evaluation in the office included a PSA, which was  elevated at 4.2.  This was evaluated with MRI scan, which did not reveal any high-grade PI-RADS lesions.  Prostate volume was 73.3 cc.  Cystoscopy on 06/11 indicating a 3 cm prostatic urethral length with lateral lobe hypertrophy and obstruction.   Uroflow study 06/17 indicating a maximum flow rate of 12 mL per second, average flow of 4.9 mL and a postvoid residual of 129 mL.  The patient comes in now for UroLift procedure.  PAST MEDICAL HISTORY:    ALLERGIES:  No drug allergies.  CURRENT MEDICATIONS:  Lisinopril, amlodipine, omeprazole, hydrocodone, and sildenafil.  PAST SURGICAL HISTORY: 1.  Repair of fractured right zygoma in 1977. 2.  Appendectomy in 1954.   3.  Right knee replacement in 2011. 4.  Left knee replacement in 2006. 5.  Right hip replacement in 2015. 6.  Umbilical herniorrhaphy 0867. 7.  Uvulopalatopharyngoplasty for sleep apnea in 1998 that was not effective.  PAST AND CURRENT MEDICAL CONDITIONS:    1.  Hypertension. 2.  Hypercholesterolemia. 3.  GERD. 4.  Depression with anxiety. 5.  Chronic low back pain. 6.  Sleep apnea -- uses CPAP machine. 7.  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 8.  Degenerative joint disease.  REVIEW OF SYSTEMS:  The patient has decreased auditory and visual acuity.  He has allergic rhinitis.  He has some difficulty swallowing.  He has chronic joint stiffness and pain.  He occasionally  has numbness and tingling of his right toes.  He has  chronic insomnia.  He denies chest pain, diabetes, stroke or heart disease.  SOCIAL HISTORY:  The patient denied tobacco or alcohol use.  FAMILY HISTORY:  Father died of COPD, unknown age.  Mother died of COPD at unknown age.  There is no family history of prostate cancer.  PHYSICAL EXAMINATION: VITAL SIGNS:  Height 6 feet 1 inch, weight 274 pounds, BMI 36. GENERAL:  Obese white male in no acute distress. HEENT:  Sclerae were clear.  Pupils are equally round, reactive to light and accommodation.  Extraocular movements were intact. NECK:  No palpable cervical masses or tenderness.  Thyroid gland was smooth, nontender, without nodules. LYMPHATIC:  No palpable cervical or inguinal adenopathy. PULMONARY:  Lungs clear to auscultation. CARDIOVASCULAR:  Regular rhythm and rate without audible murmurs. ABDOMEN:  Soft, nontender abdomen.  No CVA tenderness. GENITOURINARY:  Circumcised.  Testes were both smooth, nontender, approximately 18 cc in size each. RECTAL:  Greater than 40 g, smooth, nontender prostate. NEUROMUSCULAR:  Alert and oriented x3.  IMPRESSION: 1.  Benign prostatic hypertrophy with bladder outlet obstruction. 2.  Elevated PSA due to benign prostatic hypertrophy. 3.  Erectile dysfunction.  PLAN:  UroLift procedure.  CN/NUANCE  D:10/20/2019 T:10/20/2019 JOB:[id number]

## 2019-10-26 ENCOUNTER — Encounter
Admission: RE | Admit: 2019-10-26 | Discharge: 2019-10-26 | Disposition: A | Discharge status: Home / Self Care | Payer: Medicare HMO | Source: Ambulatory Visit | Attending: Urology | Admitting: Urology

## 2019-10-26 ENCOUNTER — Other Ambulatory Visit: Payer: Self-pay

## 2019-10-26 HISTORY — DX: Personal history of other diseases of the digestive system: Z87.19

## 2019-10-26 HISTORY — DX: Radiculopathy, lumbar region: M54.16

## 2019-10-26 HISTORY — DX: Male erectile dysfunction, unspecified: N52.9

## 2019-10-26 HISTORY — DX: Malignant (primary) neoplasm, unspecified: C80.1

## 2019-10-26 HISTORY — DX: Nontoxic multinodular goiter: E04.2

## 2019-10-26 HISTORY — DX: Other intervertebral disc degeneration, lumbar region without mention of lumbar back pain or lower extremity pain: M51.369

## 2019-10-26 HISTORY — DX: Other intervertebral disc degeneration, lumbar region: M51.36

## 2019-10-26 NOTE — Patient Instructions (Addendum)
INSTRUCTIONS FOR SURGERY     Your surgery is scheduled for:   Thursday, July 22ND     To find out your arrival time for the day of surgery,          please call [phone] between 1 pm and 3 pm on :  Wednesday, July 21ST     When you arrive for surgery, report to the [place].       Do NOT stop on the first floor to register.    REMEMBER: Instructions that are not followed completely may result in serious medical risk,  up to and including death, or upon the discretion of your surgeon and anesthesiologist,            your surgery may need to be rescheduled.  __X__ 1. Do not eat food after midnight the night before your procedure.                    No gum, candy, lozenger, tic tacs, tums or hard candies.                  ABSOLUTELY NOTHING SOLID IN YOUR MOUTH AFTER MIDNIGHT                    You may drink unlimited clear liquids up to 2 hours before you are scheduled to arrive for surgery.                   Do not drink anything within those 2 hours unless you need to take medicine, then take the                   smallest amount you need.  Clear liquids include:  water, apple juice without pulp,                   any flavor Gatorade, Black coffee, black tea.  Sugar may be added but no dairy/ honey /lemon.                        Broth and jello is not considered a clear liquid.  __x__  2. On the morning of surgery, please brush your teeth with toothpaste and water. You may rinse with                  mouthwash if you wish but DO NOT SWALLOW TOOTHPASTE OR MOUTHWASH  __X___3. NO alcohol for 24 hours before or after surgery.  __x___ 4.  Do NOT smoke or use e-cigarettes for 24 HOURS PRIOR TO SURGERY.                      DO NOT Use any chewable tobacco products for at least 6 hours prior to surgery.  __x___ 5. If you start any new medication after this appointment and prior to surgery, please                    Bring it with you on the day of surgery.  ___x__ 6. Notify your doctor if there is any change in  your medical condition, such as fever,                   infection, vomitting, diarrhea or any open sores.  __x___ 7.  USE the ANTIBACTERIAL SOAP as instructed, the night before surgery OR the day of surgery.                   Once you have washed with this soap, do NOT use any of the following: Powders, perfumes                    or lotions. Please do not wear make up, hairpins, clips or nail polish. You may wear deodorant.                   Men may shave their face and neck.  Women need to shave 48 hours prior to surgery.                   DO NOT wear ANY jewelry on the day of surgery. If there are rings that are too tight to                    remove easily, please address this prior to the surgery day. Piercings need to be removed.                                                                     NO METAL ON YOUR BODY.                    Do NOT bring any valuables.  If you came to Pre-Admit testing then you will not need license,                     insurance card or credit card.  If you will be staying overnight, please either leave your things in                     the car or have your family be responsible for these items.                     [facility] IS NOT RESPONSIBLE FOR BELONGINGS OR VALUABLES.  ___X__ 8. DO NOT wear contact lenses on surgery day.  You may not have dentures,                     Hearing aides, contacts or glasses in the operating room. These items can be                    Placed in the Recovery Room to receive immediately after surgery.  __x___ 9. IF YOU ARE SCHEDULED TO GO HOME ON THE SAME DAY, YOU MUST                   Have someone to drive you home and to stay with you  for the first 24 hours.                    Have an arrangement prior to arriving on surgery day.  ___x__ 10. Take the following medications on  the morning of surgery with a sip of  water:                              1. AMLODIPINE                     2. PRILOSEC,(take an extra dose the night before surgery)                     3. TAMSULOSIN                     4.  _____ 11.  Follow any instructions provided to you by your surgeon.                        Such as enema, clear liquid bowel prep  __X__  12. STOP ALL ASPIRIN PRODUCTS 1 WEEK PRIOR TO SURGERY.                       THIS INCLUDES BC POWDERS / GOODIES POWDER  __x___ 13. STOP Anti-inflammatories as of: 1 WEEK PRIOR TO SURGERY                      This includes IBUPROFEN / MOTRIN / ADVIL / ALEVE/ NAPROXYN                    YOU MAY TAKE TYLENOL ANY TIME PRIOR TO SURGERY.  _X____ 14.  Stop supplements until after surgery.                     This includes: MULTIVITAMINS // ARED PRESERVISION                 You may continue taking Vitamin D3 but do not take on the morning of surgery.   __X____17.  Continue to take the following medications but do not take on the morning of surgery:                        HYGROTON // LISINOPRIL // POTASSIUM // VITAMINS // AREDS PRESERVISION  ______18. Wear clean and comfortable clothing to the hospital.  PLEASE BRING PHONE [place].

## 2019-10-31 ENCOUNTER — Other Ambulatory Visit: Payer: Medicare HMO

## 2019-11-02 ENCOUNTER — Encounter: Payer: Self-pay | Source: Ambulatory Visit

## 2019-11-02 ENCOUNTER — Ambulatory Visit: Admit: 2019-11-02 | Payer: Medicare HMO | Source: Ambulatory Visit | Admitting: Urology

## 2019-11-02 SURGERY — CYSTOSCOPY WITH INSERTION OF UROLIFT
Anesthesia: Choice

## 2020-04-25 ENCOUNTER — Other Ambulatory Visit: Payer: Self-pay | Admitting: Gastroenterology

## 2020-04-25 ENCOUNTER — Other Ambulatory Visit: Payer: Self-pay

## 2020-04-25 ENCOUNTER — Ambulatory Visit
Admission: RE | Admit: 2020-04-25 | Discharge: 2020-04-25 | Disposition: A | Discharge status: Home / Self Care | Payer: Medicare HMO | Source: Ambulatory Visit | Attending: Gastroenterology | Admitting: Gastroenterology

## 2020-04-25 ENCOUNTER — Other Ambulatory Visit (HOSPITAL_COMMUNITY): Payer: Self-pay | Admitting: Gastroenterology

## 2020-04-25 DIAGNOSIS — R140 Abdominal distension (gaseous): Secondary | ICD-10-CM

## 2020-04-25 DIAGNOSIS — R1013 Epigastric pain: Secondary | ICD-10-CM | POA: Diagnosis present

## 2020-04-25 DIAGNOSIS — R102 Pelvic and perineal pain: Secondary | ICD-10-CM | POA: Insufficient documentation

## 2020-04-25 MED ORDER — IOHEXOL 300 MG/ML  SOLN
100.0000 mL | Freq: Once | INTRAMUSCULAR | Status: AC | PRN
  Administered 2020-04-25: 100 mL via INTRAVENOUS

## 2020-06-17 DIAGNOSIS — M25552 Pain in left hip: Secondary | ICD-10-CM | POA: Insufficient documentation

## 2020-11-01 NOTE — Patient Instructions (Signed)
DUE TO COVID-19 ONLY ONE VISITOR IS ALLOWED TO COME WITH YOU AND STAY IN THE WAITING ROOM ONLY DURING PRE OP AND PROCEDURE DAY OF SURGERY. THE 2 VISITORS  MAY VISIT WITH YOU AFTER SURGERY IN YOUR PRIVATE ROOM DURING VISITING HOURS ONLY!  YOU NEED TO HAVE A COVID 19 TEST ON__8/1_____ '@_8am'$ -1PM______, THIS TEST MUST BE DONE BEFORE SURGERY,  . Jeffrey [name]               [name]     Your procedure is scheduled on: 11/13/20   Report to [facility] Main  Entrance   Report to admitting at  6:00 AM     Call this number if you have problems the morning of surgery [place], NO CHEWING GUM [place].   No food after midnight.    You may have clear liquid until 4:30 AM.    At 4:00 AM drink pre surgery drink.   Nothing by mouth after 4:30 AM.    Amlodipine, Omeprazole  Take these medicines the morning of surgery with A SIP OF WATER.                                You may not have any metal on your body including hair pins and              piercings  Do not wear jewelry, make-up, lotions, powders or perfumes, deodorant             Do not wear nail polish on your fingernails.  Do not shave  48 hours prior to surgery.                Do not bring valuables to the hospital. [place].  Contacts, dentures or bridgework may not be worn into surgery.Marland Kitchen      Special Instructions: N/A              Please read over the following fact sheets you were given: _____________________________________________________________________             [facility] - Preparing for Surgery Before surgery, you can play an important role.  Because skin is not sterile, your skin needs to be as free of germs as possible.  You can reduce the number of germs on your skin by washing with CHG (chlorahexidine gluconate) soap before surgery.  CHG is an antiseptic cleaner which  kills germs and bonds with the skin to continue killing germs even after washing. Please DO NOT use if you have an allergy to CHG or antibacterial soaps.  If your skin becomes reddened/irritated stop using the CHG and inform your nurse when you arrive at Short Stay.   You may shave your face/neck.  Please follow these instructions carefully:  1.  Shower with CHG Soap the night before surgery and the  morning of Surgery.  2.  If you choose to wash your hair, wash your hair first as usual with your  normal  shampoo.  3.  After you shampoo, rinse your hair and body thoroughly to remove the  shampoo.  4.  Use CHG as you would any other liquid soap.  You can apply chg directly  to the skin and wash                       Gently with a scrungie or clean washcloth.  5.  Apply the CHG Soap to your body ONLY FROM THE NECK DOWN.   Do not use on face/ open                           Wound or open sores. Avoid contact with eyes, ears mouth and genitals (private parts).                       Wash face,  Genitals (private parts) with your normal soap.             6.  Wash thoroughly, paying special attention to the area where your surgery  will be performed.  7.  Thoroughly rinse your body with warm water from the neck down.  8.  DO NOT shower/wash with your normal soap after using and rinsing off  the CHG Soap.             9.  Pat yourself dry with a clean towel.            10.  Wear clean pajamas.            11.  Place clean sheets on your bed the night of your first shower and do not  sleep with pets. Day of Surgery : Do not apply any lotions/deodorants the morning of surgery.  Please wear clean clothes to the hospital/surgery center.  FAILURE TO FOLLOW THESE INSTRUCTIONS MAY RESULT IN THE CANCELLATION OF YOUR SURGERY PATIENT SIGNATURE_________________________________  NURSE  SIGNATURE__________________________________  ________________________________________________________________________   Adam Phenix  An incentive spirometer is a tool that can help keep your lungs clear and active. This tool measures how well you are filling your lungs with each breath. Taking long deep breaths may help reverse or decrease the chance of developing breathing (pulmonary) problems (especially infection) following: A long period of time when you are unable to move or be active. BEFORE THE PROCEDURE  If the spirometer includes an indicator to show your best effort, your nurse or respiratory therapist will set it to a desired goal. If possible, sit up straight or lean slightly forward. Try not to slouch. Hold the incentive spirometer in an upright position. INSTRUCTIONS FOR USE  Sit on the edge of your bed if possible, or sit up as far as you can in bed or on a chair. Hold the incentive spirometer in an upright position. Breathe out normally. Place the mouthpiece in your mouth and seal your lips tightly around it. Breathe in slowly and as deeply as possible, raising the piston or the ball toward the top of the column. Hold your breath for 3-5 seconds or for as long as possible. Allow the piston or ball to fall to the bottom of the column. Remove the mouthpiece from your mouth and breathe out normally. Rest for a few seconds and repeat Steps 1 through 7 at least 10 times every 1-2 hours when you are awake. Take your time and take a few normal breaths between deep breaths. The spirometer may include an indicator to show your best effort. Use the indicator as a goal to work toward during each repetition. After  each set of 10 deep breaths, practice coughing to be sure your lungs are clear. If you have an incision (the cut made at the time of surgery), support your incision when coughing by placing a pillow or rolled up towels firmly against it. Once you are able to get out of  bed, walk around indoors and cough well. You may stop using the incentive spirometer when instructed by your caregiver.  RISKS AND COMPLICATIONS Take your time so you do not get dizzy or light-headed. If you are in pain, you may need to take or ask for pain medication before doing incentive spirometry. It is harder to take a deep breath if you are having pain. AFTER USE Rest and breathe slowly and easily. It can be helpful to keep track of a log of your progress. Your caregiver can provide you with a simple table to help with this. If you are using the spirometer at home, follow these instructions: [place] IF:  You are having difficultly using the spirometer. You have trouble using the spirometer as often as instructed. Your pain medication is not giving enough relief while using the spirometer. You develop fever of 100.5 F (38.1 C) or higher. SEEK IMMEDIATE MEDICAL CARE IF:  You cough up bloody sputum that had not been present before. You develop fever of 102 F (38.9 C) or greater. You develop worsening pain at or near the incision site. MAKE SURE YOU:  Understand these instructions. Will watch your condition. Will get help right away if you are not doing well or get worse. Document Released: 08/10/2006 Document Revised: 06/22/2011 Document Reviewed: 10/11/2006 [employer] Patient Information 2014 [place], [place].   ________________________________________________________________________

## 2020-11-04 ENCOUNTER — Encounter (HOSPITAL_COMMUNITY)
Admission: RE | Admit: 2020-11-04 | Discharge: 2020-11-04 | Disposition: A | Discharge status: Home / Self Care | Payer: Medicare HMO | Source: Ambulatory Visit | Attending: Family Medicine | Admitting: Family Medicine

## 2020-11-04 ENCOUNTER — Encounter (HOSPITAL_COMMUNITY): Payer: Self-pay

## 2020-11-06 ENCOUNTER — Encounter (HOSPITAL_COMMUNITY): Admission: RE | Admit: 2020-11-06 | Payer: Medicare HMO | Source: Ambulatory Visit

## 2020-11-13 ENCOUNTER — Ambulatory Visit: Admit: 2020-11-13 | Payer: Medicare HMO | Admitting: Orthopedic Surgery

## 2020-11-13 SURGERY — ARTHROPLASTY, HIP, TOTAL, ANTERIOR APPROACH
Anesthesia: Choice | Site: Hip | Laterality: Left

## 2020-12-09 ENCOUNTER — Encounter: Payer: Self-pay | Admitting: Physical Therapy

## 2020-12-09 ENCOUNTER — Ambulatory Visit: Payer: Medicare HMO | Attending: Pain Medicine | Admitting: Physical Therapy

## 2020-12-09 ENCOUNTER — Other Ambulatory Visit: Payer: Self-pay

## 2020-12-09 DIAGNOSIS — M47817 Spondylosis without myelopathy or radiculopathy, lumbosacral region: Secondary | ICD-10-CM | POA: Diagnosis not present

## 2020-12-09 DIAGNOSIS — G8929 Other chronic pain: Secondary | ICD-10-CM | POA: Insufficient documentation

## 2020-12-09 DIAGNOSIS — M545 Low back pain, unspecified: Secondary | ICD-10-CM | POA: Diagnosis not present

## 2020-12-09 NOTE — Therapy (Signed)
[facility] [address], [place], [address] Phone: [phone]   Fax:  [phone]  Physical Therapy Evaluation  Patient Details  Name: Jeffrey [name] MRN: [id number] Date of Birth: [birth year] No data recorded  Encounter Date: 12/09/2020   PT End of Session - 12/09/20 1013     Visit Number 1    Number of Visits 17    Date for PT Re-Evaluation 02/03/21    Authorization - Visit Number 1    Progress Note Due on Visit 10    PT Start Time 0732    PT Stop Time 0815    PT Time Calculation (min) 43 min    Activity Tolerance Patient tolerated treatment well    Behavior During Therapy [facility] for tasks assessed/performed             Past Medical History:  Diagnosis Date   Anal fissure    Anemia    Anxiety    Arthritis    Barrett esophagus    BPH (benign prostatic hyperplasia)    Cancer ([place])    skin cancer removed with MOHS   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 followed by neurology. 2 year follow up from now.   DDD (degenerative disc disease), lumbar    Depression    ED (erectile dysfunction)    Esophageal candidiasis ([place] 09/2019   treated with fluconazole for 2 weeks.    GERD (gastroesophageal reflux disease)    Headache 10/2019   silent migraine causes vision problems   History of hiatal hernia    Hyperlipidemia    Hypertension    Lumbar radiculitis    Multiple thyroid nodules    Sleep apnea    does not use a cpap machine.    Tremors of nervous system    Vitiligo     Past Surgical History:  Procedure Laterality Date   APPENDECTOMY     cheek surgery Right    stainless steel in right cheek   COLONOSCOPY     COLONOSCOPY WITH PROPOFOL N/A 02/19/2017   Procedure: COLONOSCOPY WITH PROPOFOL;  Surgeon: Toledo, TBenay Pike MD;  Location: [facility];  Service: Gastroenterology;  Laterality: N/A;   ESOPHAGOGASTRODUODENOSCOPY     ESOPHAGOGASTRODUODENOSCOPY  (EGD) WITH PROPOFOL N/A 02/19/2017   Procedure: ESOPHAGOGASTRODUODENOSCOPY (EGD) WITH PROPOFOL;  Surgeon: Toledo, TBenay Pike MD;  Location: [facility];  Service: Gastroenterology;  Laterality: N/A;   ESOPHAGOGASTRODUODENOSCOPY (EGD) WITH PROPOFOL N/A 09/27/2019   Procedure: ESOPHAGOGASTRODUODENOSCOPY (EGD) WITH PROPOFOL;  Surgeon: Toledo, TBenay Pike MD;  Location: [facility] END[facility];  Service: Gastroenterology;  Laterality: N/A;   FUNCTIONAL ENDOSCOPIC SINUS SURGERY     uvuloplasty done for OSA   HERNIA REPAIR     umbilical with mesh repair   JOINT REPLACEMENT Right 2012   THR   JOINT REPLACEMENT Bilateral    TKR    There were no vitals filed for this visit.    Subjective Assessment - 12/09/20 0733     Subjective Jeffrey Glaeseris a 75year old male with a long standing history of LBP. He reports having back pain for approximately 10-15 years with no recent exacerbation or MOI and denies any radiating symptoms into LEs. He reports having a steroid injection in L Hip 3 days ago. He reports that standing, bending, and walking are primary aggravators of his back pain. He currently takes hydrocodone, naproxen, and utilizes heat as alleviators. He rates his pain today as an 3/10  and 8/10 at worst. He would like to return to PLOF by decreasing back pain in order to perform yardwork, participate in commnity activities, and negotiate stairs in/outside of home. Pt denies any unexplained weight fluctuation, saddle paresthesia, loss of bowel/bladder function, or unrelenting night pain at this time. He does have a PMH of skin cancer, bilat TKA, R THA, anxiety, and arthritis.    Pertinent History Jeffrey [name] is a 75 year old male with a long standing history of LBP. He reports having back pain for approximately 10-15 years with no recent exacerbation or MOI and denies any radiating symptoms into LEs. He reports having a steroid injection in L Hip 3 days ago. He reports that standing, bending, and walking are  primary aggravators of his back pain. He currently takes hydrocodone, naproxen, and utilizes heat as alleviators. He rates his pain today as an 3/10 and 8/10 at worst. He would like to return to PLOF by decreasing back pain in order to perform yardwork, participate in commnity activities, and negotiate stairs in/outside of home. Pt denies any unexplained weight fluctuation, saddle paresthesia, loss of bowel/bladder function, or unrelenting night pain at this time. He does have a PMH of skin cancer, bilat TKA, R THA, anxiety, and arthritis.    Limitations Lifting;Walking;Standing    How long can you sit comfortably? 20-30    How long can you stand comfortably? 30    How long can you walk comfortably? 5-10    Diagnostic tests MRI    Patient Stated Goals Being able to peform yardwork    Currently in Pain? Yes    Pain Score 3     Pain Location Back    Pain Orientation Left;Lower;Right    Pain Descriptors / Indicators Aching    Pain Type Chronic pain    Pain Onset More than a month ago    Pain Frequency Constant    Aggravating Factors  bend, lifting, walking, rolling over bed    Pain Relieving Factors naprexon, hydrocodone, heat    Effect of Pain on Daily Activities limits ability to perform stairs            Objective  Lumbar AROM Flex: limited 25% Ext: WFL with noted pain relief with repeated motion Lateral flexion WFL Rotation L/R WFL     HIP MMT  Gross Bilat LE Strength 4/5    Hip AROM/PROM   [facility]    PAIVM/CPA:  P-A joint mobilizations grade 2 with noted increase in pain concordant pain L4-S1 hypomobile      Special Tests: FABER/FADIR (-) Scour (- ) Thigh Thrust (-)   Observation:  Gait: Decreased step length, reciprocal arm swing,   Posture: increased lumbar lordosis, forward head, bilateral rounded shoulders, increased thoracic kyphosis, R shoulder height decreased   Palpation: No tenderness to lumbar region;    Sensation: intact bilaterally  Test &  Measures:   10MWT: 1.2 m/s   Stair negotiation: reciprocal with unilateral  UE support; reports step to pattern at home  Sit to stand: without UE use  Plank test: 20 sec with increasing lumbar ext over last ~5sec    Objective measurements completed on examination: See above findings.      Ther-Ex   Sit to stand 3 x 8-10 reps 2x/week Standing Lumbar extension every hour 12-20 reps   Prone lumbar extension 1 x 12-20 reps   PT reviewed the following HEP with patient with patient able to demonstrate a set of the following with min cuing for  correction needed. PT educated patient on parameters of therex (how/when to inc/decrease intensity, frequency, rep/set range, stretch hold time, and purpose of therex) with verbalized understanding.             PT Education - 12/09/20 0920     Education Details Pt educated on HEP the benefits physical therapy    Methods Explanation;Demonstration    Comprehension Verbalized understanding;Returned demonstration              PT Short Term Goals - 12/09/20 1016       PT SHORT TERM GOAL #1   Title Pt will be independent with HEP in order to self-manage condition.    Baseline HEP given    Time 4    Period Weeks    Status New    Target Date 01/06/21               PT Long Term Goals - 12/09/20 1016       PT LONG TERM GOAL #1   Title Patient will increase FOTO score to ** to demonstrate predicted increase in functional mobility to complete ADLs    Baseline 8/29:    Time 8    Period Weeks    Status New    Target Date 02/03/21      PT LONG TERM GOAL #2   Title Pt will decrease worst pain as reported on NPRS by at least 3 points in order to demonstrate clinically significant reduction in pain.    Baseline 8/29: 8/10 NPRS    Time 8    Period Weeks    Status New    Target Date 02/03/21      PT LONG TERM GOAL #3   Title Patient will demonstrate gross bilat hip strength of 4+/5 in order to complete heavy household  ADLs    Baseline 8/29: 4/5    Time 8    Period Weeks    Status New    Target Date 02/03/21      PT LONG TERM GOAL #4   Title Pt will increase 6MWT by at least 73m(164f without LBP greater than 5/10 pain in order to demonstrate clinically significant improvement in activity tolerance and community ambulation.    Baseline deferred    Time 8    Period Weeks    Status New    Target Date 02/03/21                    Plan - 12/09/20 [id number]   Clinical Impression Statement Patient is a [age]/o male presenting today with chronic LBP. Patient presents with decreased LE core/strength, decreased activity tolerance, and pain. Patients impairments limitate prolonged walking, standing, sitting, bending, and lifting that prohibit participation in completing ADLs shopping, yardwork, and negotiating stairs to access the home. Patient will benefit from skilled PT to address these impairments in order to restore PLOF.    Personal Factors and Comorbidities Age;Comorbidity 2;Fitness;Time since onset of injury/illness/exacerbation    Examination-Activity Limitations Bed Mobility;Dressing;Sit;Bend;Lift;Locomotion Level;Stairs;Squat;Stand    Examination-Participation Restrictions Cleaning;Yard Work;Community Activity    Stability/Clinical Decision Making Stable/Uncomplicated    Clinical Decision Making Low    Rehab Potential Good    PT Frequency 2x / week    PT Duration 8 weeks    PT Treatment/Interventions Cryotherapy;Moist Heat;ADLs/Self Care Home Management;Traction;Gait training;Stair training;Functional mobility training;Therapeutic activities;Therapeutic exercise;Balance training;Neuromuscular re-education;Patient/family education;Manual techniques;Passive range of motion;Dry needling;Energy conservation;Joint Manipulations;Spinal Manipulations;Electrical Stimulation    PT Next Visit Plan review/update HEP  PT Home Exercise Plan Prone/standing extension and sit to stands    Consulted and  Agree with Plan of Care Patient             Patient will benefit from skilled therapeutic intervention in order to improve the following deficits and impairments:  Abnormal gait, Decreased balance, Decreased endurance, Decreased mobility, Difficulty walking, Hypomobility, Decreased range of motion, Improper body mechanics, Obesity, Pain, Postural dysfunction, Impaired flexibility, Decreased strength, Decreased activity tolerance  Visit Diagnosis: Lumbosacral pain, chronic     Problem List Patient Active Problem List   Diagnosis Date Noted   Essential tremor 12/06/2013   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, [place], [address] Phone: [phone]   Fax:  [phone]  Name: Jeffrey [name] MRN: [id number] Date of Birth: 1945/12/26

## 2020-12-12 ENCOUNTER — Ambulatory Visit: Payer: Medicare HMO | Admitting: Physical Therapy

## 2020-12-17 ENCOUNTER — Encounter: Payer: Medicare HMO | Admitting: Physical Therapy

## 2020-12-17 ENCOUNTER — Ambulatory Visit: Payer: Medicare HMO | Attending: Pain Medicine | Admitting: Physical Therapy

## 2020-12-18 ENCOUNTER — Ambulatory Visit: Payer: Medicare HMO | Admitting: Physical Therapy

## 2020-12-20 ENCOUNTER — Encounter: Payer: Medicare HMO | Admitting: Physical Therapy

## 2020-12-23 ENCOUNTER — Encounter: Payer: Medicare HMO | Admitting: Physical Therapy

## 2020-12-24 ENCOUNTER — Ambulatory Visit: Payer: Medicare HMO | Admitting: Physical Therapy

## 2020-12-27 ENCOUNTER — Encounter: Payer: Medicare HMO | Admitting: Physical Therapy

## 2020-12-30 ENCOUNTER — Encounter: Payer: Medicare HMO | Admitting: Physical Therapy

## 2021-01-02 ENCOUNTER — Encounter: Payer: Medicare HMO | Admitting: Physical Therapy

## 2021-01-06 ENCOUNTER — Encounter: Payer: Medicare HMO | Admitting: Physical Therapy

## 2021-01-07 ENCOUNTER — Encounter: Payer: Medicare HMO | Admitting: Physical Therapy

## 2021-01-09 ENCOUNTER — Encounter: Payer: Medicare HMO | Admitting: Physical Therapy

## 2021-01-14 ENCOUNTER — Encounter: Payer: Medicare HMO | Admitting: Physical Therapy

## 2021-01-16 ENCOUNTER — Encounter: Payer: Medicare HMO | Admitting: Physical Therapy

## 2021-01-21 ENCOUNTER — Encounter: Payer: Medicare HMO | Admitting: Physical Therapy

## 2021-01-23 ENCOUNTER — Encounter: Payer: Medicare HMO | Admitting: Physical Therapy

## 2021-01-28 ENCOUNTER — Encounter: Payer: Medicare HMO | Admitting: Physical Therapy

## 2021-01-30 ENCOUNTER — Encounter: Payer: Medicare HMO | Admitting: Physical Therapy

## 2021-02-04 ENCOUNTER — Encounter: Payer: Medicare HMO | Admitting: Physical Therapy

## 2021-02-06 ENCOUNTER — Encounter: Payer: Medicare HMO | Admitting: Physical Therapy

## 2021-02-10 ENCOUNTER — Encounter: Payer: Medicare HMO | Admitting: Physical Therapy

## 2021-06-18 NOTE — Progress Notes (Signed)
Sent message, via epic in basket, requesting orders in epic from surgeon.  

## 2021-06-23 NOTE — Patient Instructions (Signed)
DUE TO COVID-19 ONLY ONE VISITOR  (aged 77 and older)  IS ALLOWED TO COME WITH YOU AND STAY IN THE WAITING ROOM ONLY DURING PRE OP AND PROCEDURE.   **NO VISITORS ARE ALLOWED IN THE SHORT STAY AREA OR RECOVERY ROOM!!**  IF YOU WILL BE ADMITTED INTO THE HOSPITAL YOU ARE ALLOWED ONLY TWO SUPPORT PEOPLE DURING VISITATION HOURS ONLY (7 AM -8PM)   The support person(s) must pass our screening, gel in and out, and wear a mask at all times, including in the patients room. Patients must also wear a mask when staff or their support person are in the room. Visitors GUEST BADGE MUST BE WORN VISIBLY  One adult visitor may remain with you overnight and MUST be in the room by 8 P.M.     COVID SWAB TESTING MUST BE COMPLETED ON:  07-07-21@      (*ARRIVE AT YOUR APPOINTMENT TIME STAFF IS NOT HERE BEFORE 8AM!!!*)    Site: [facility] [address] [place] Enter: Main Entrance have a seat in the waiting area to the right of main entrance (DO NOT [place]!!!!!) Dial: [phone] to alert staff you have arrived  You are not required to quarantine, however you are required to wear a well-fitted mask when you are out and around people not in your household.  Hand Hygiene often Do NOT share personal items Notify your provider if you are in close contact with someone who has COVID or you develop fever 100.4 or greater, new onset of sneezing, cough, sore throat, shortness of breath or body aches.   Your procedure is scheduled on: 07-09-21   Report to [facility] Main Entrance    Report to admitting at      [place] AM   Call this number if you have problems the morning of surgery [phone]   Do not eat food :After Midnight.   After Midnight you may have the following liquids until _   0930 am then nothing by mouth_____  Water Black Coffee (sugar ok, NO MILK/CREAM OR CREAMERS)  Tea (sugar ok, NO MILK/CREAM OR CREAMERS) regular and decaf                              Plain Jell-O (NO RED)                                           Fruit ices (not with fruit pulp, NO RED)                                     Popsicles (NO RED)                                                                  Juice: apple, WHITE grape, WHITE cranberry Sports drinks like Gatorade (NO RED) Clear broth(vegetable,chicken,beef)               Drink 2 Ensure/G2 drinks AT 10:00 PM the night before surgery.  The day of surgery:  Drink ONE (1) Pre-Surgery Clear Ensure  at      0915 AM the morning of surgery. Drink in one sitting. Do not sip.  This drink was given to you during your hospital  pre-op appointment visit.  Nothing else to drink after completing the  Pre-Surgery Clear Ensure or G2.          If you have questions, please contact your surgeons office.        Oral Hygiene is also important to reduce your risk of infection.                                    Remember - BRUSH YOUR TEETH THE MORNING OF SURGERY WITH YOUR REGULAR TOOTHPASTE   Do NOT smoke after Midnight   Take these medicines the morning of surgery with A SIP OF WATER:   DO NOT TAKE ANY ORAL DIABETIC MEDICATIONS DAY OF YOUR SURGERY  Bring CPAP mask and tubing day of surgery.                              You may not have any metal on your body including hair pins, jewelry, and body piercing             Do not wear make-up, lotions, powders, /cologne, or deodorant Do not shave  48 hours prior to surgery.               Men may shave face and neck.   Do not bring valuables to the hospital. [place].   Contacts, dentures or bridgework may not be worn into surgery.   Bring small overnight bag day of surgery.    Patients discharged on the day of surgery will not be allowed to drive home.  Someone NEEDS to stay with you for the first 24 hours after anesthesia.   Special Instructions: Bring a copy of your healthcare power of attorney and  living will documents   the day of surgery if you haven't scanned them before.              Please read over the following fact sheets you were given: IF YOU HAVE QUESTIONS ABOUT YOUR PRE-OP INSTRUCTIONS PLEASE CALL [phone]     [facility] - Preparing for Surgery Before surgery, you can play an important role.  Because skin is not sterile, your skin needs to be as free of germs as possible.  You can reduce the number of germs on your skin by washing with CHG (chlorahexidine gluconate) soap before surgery.  CHG is an antiseptic cleaner which kills germs and bonds with the skin to continue killing germs even after washing. Please DO NOT use if you have an allergy to CHG or antibacterial soaps.  If your skin becomes reddened/irritated stop using the CHG and inform your nurse when you arrive at Short Stay. Do not shave (including legs and underarms) for at least 48 hours prior to the first CHG shower.  You may shave your face/neck. Please follow these instructions carefully:  1.  Shower with CHG Soap the night before surgery and the  morning of Surgery.  2.  If you choose to wash your hair, wash your hair first as usual with  your  normal  shampoo.  3.  After you shampoo, rinse your hair and body thoroughly to remove the  shampoo.                           4.  Use CHG as you would any other liquid soap.  You can apply chg directly  to the skin and wash                       Gently with a scrungie or clean washcloth.  5.  Apply the CHG Soap to your body ONLY FROM THE NECK DOWN.   Do not use on face/ open                           Wound or open sores. Avoid contact with eyes, ears mouth and genitals (private parts).                       Wash face,  Genitals (private parts) with your normal soap.             6.  Wash thoroughly, paying special attention to the area where your surgery  will be performed.  7.  Thoroughly rinse your body with warm water from the neck down.  8.  DO NOT shower/wash with  your normal soap after using and rinsing off  the CHG Soap.                9.  Pat yourself dry with a clean towel.            10.  Wear clean pajamas.            11.  Place clean sheets on your bed the night of your first shower and do not  sleep with pets. Day of Surgery : Do not apply any lotions/deodorants the morning of surgery.  Please wear clean clothes to the hospital/surgery center.  FAILURE TO FOLLOW THESE INSTRUCTIONS MAY RESULT IN THE CANCELLATION OF YOUR SURGERY PATIENT SIGNATURE_________________________________  NURSE SIGNATURE__________________________________  ________________________________________________________________________   Adam Phenix  An incentive spirometer is a tool that can help keep your lungs clear and active. This tool measures how well you are filling your lungs with each breath. Taking long deep breaths may help reverse or decrease the chance of developing breathing (pulmonary) problems (especially infection) following: A long period of time when you are unable to move or be active. BEFORE THE PROCEDURE  If the spirometer includes an indicator to show your best effort, your nurse or respiratory therapist will set it to a desired goal. If possible, sit up straight or lean slightly forward. Try not to slouch. Hold the incentive spirometer in an upright position. INSTRUCTIONS FOR USE  Sit on the edge of your bed if possible, or sit up as far as you can in bed or on a chair. Hold the incentive spirometer in an upright position. Breathe out normally. Place the mouthpiece in your mouth and seal your lips tightly around it. Breathe in slowly and as deeply as possible, raising the piston or the ball toward the top of the column. Hold your breath for 3-5 seconds or for as long as possible. Allow the piston or ball to fall to the bottom of the column. Remove the mouthpiece from your mouth and breathe out normally. Rest for a few seconds and repeat  Steps 1 through 7 at least 10 times every 1-2 hours when you are awake. Take your time and take a few normal breaths between deep breaths. The spirometer may include an indicator to show your best effort. Use the indicator as a goal to work toward during each repetition. After each set of 10 deep breaths, practice coughing to be sure your lungs are clear. If you have an incision (the cut made at the time of surgery), support your incision when coughing by placing a pillow or rolled up towels firmly against it. Once you are able to get out of bed, walk around indoors and cough well. You may stop using the incentive spirometer when instructed by your caregiver.  RISKS AND COMPLICATIONS Take your time so you do not get dizzy or light-headed. If you are in pain, you may need to take or ask for pain medication before doing incentive spirometry. It is harder to take a deep breath if you are having pain. AFTER USE Rest and breathe slowly and easily. It can be helpful to keep track of a log of your progress. Your caregiver can provide you with a simple table to help with this. If you are using the spirometer at home, follow these instructions: [place] IF:  You are having difficultly using the spirometer. You have trouble using the spirometer as often as instructed. Your pain medication is not giving enough relief while using the spirometer. You develop fever of 100.5 F (38.1 C) or higher. SEEK IMMEDIATE MEDICAL CARE IF:  You cough up bloody sputum that had not been present before. You develop fever of 102 F (38.9 C) or greater. You develop worsening pain at or near the incision site. MAKE SURE YOU:  Understand these instructions. Will watch your condition. Will get help right away if you are not doing well or get worse. Document Released: 08/10/2006 Document Revised: 06/22/2011 Document Reviewed: 10/11/2006 [facility] Patient Information 2014 [place],  [place].   ________________________________________________________________________

## 2021-06-23 NOTE — Progress Notes (Signed)
PCP -  Cardiologist -   PPM/ICD -  Device Orders -  Rep Notified -   Chest x-ray -  EKG -  Stress Test -  ECHO -  Cardiac Cath -   Sleep Study -  CPAP -   Fasting Blood Sugar -  Checks Blood Sugar _____ times a day  Blood Thinner Instructions: Aspirin Instructions:  ERAS Protcol - PRE-SURGERY Ensure or G2-   COVID TEST-  COVID vaccine -  Activity-- Anesthesia review:   Patient denies shortness of breath, fever, cough and chest pain at PAT appointment   All instructions explained to the patient, with a verbal understanding of the material. Patient agrees to go over the instructions while at home for a better understanding. Patient also instructed to self quarantine after being tested for COVID-19. The opportunity to ask questions was provided.   

## 2021-06-26 ENCOUNTER — Encounter (HOSPITAL_COMMUNITY)
Admission: RE | Admit: 2021-06-26 | Discharge: 2021-06-26 | Disposition: A | Discharge status: Home / Self Care | Payer: Medicare HMO | Source: Ambulatory Visit | Attending: Orthopedic Surgery | Admitting: Orthopedic Surgery

## 2021-06-26 ENCOUNTER — Encounter (HOSPITAL_COMMUNITY): Payer: Self-pay

## 2021-06-26 ENCOUNTER — Other Ambulatory Visit: Payer: Self-pay

## 2021-06-26 VITALS — BP 122/95 | HR 81 | Temp 97.1°F | Resp 18 | Ht 73.0 in | Wt 260.0 lb

## 2021-06-26 DIAGNOSIS — M1612 Unilateral primary osteoarthritis, left hip: Secondary | ICD-10-CM | POA: Insufficient documentation

## 2021-06-26 DIAGNOSIS — Z01812 Encounter for preprocedural laboratory examination: Secondary | ICD-10-CM | POA: Insufficient documentation

## 2021-06-26 DIAGNOSIS — Z01818 Encounter for other preprocedural examination: Secondary | ICD-10-CM

## 2021-06-26 HISTORY — DX: Spontaneous ecchymoses: R23.3

## 2021-06-26 HISTORY — DX: Chronic kidney disease, unspecified: N18.9

## 2021-06-26 LAB — TYPE AND SCREEN
ABO/RH(D): A POS
Antibody Screen: NEGATIVE

## 2021-06-26 LAB — CBC
HCT: 44 % (ref 39.0–52.0)
Hemoglobin: 14.9 g/dL (ref 13.0–17.0)
MCH: 30.3 pg (ref 26.0–34.0)
MCHC: 33.9 g/dL (ref 30.0–36.0)
MCV: 89.6 fL (ref 80.0–100.0)
Platelets: 158 10*3/uL (ref 150–400)
RBC: 4.91 MIL/uL (ref 4.22–5.81)
RDW: 13.4 % (ref 11.5–15.5)
WBC: 5.1 10*3/uL (ref 4.0–10.5)
nRBC: 0 % (ref 0.0–0.2)

## 2021-06-26 LAB — COMPREHENSIVE METABOLIC PANEL
ALT: 16 U/L (ref 0–44)
AST: 22 U/L (ref 15–41)
Albumin: 4.1 g/dL (ref 3.5–5.0)
Alkaline Phosphatase: 66 U/L (ref 38–126)
Anion gap: 8 (ref 5–15)
BUN: 24 mg/dL — ABNORMAL HIGH (ref 8–23)
CO2: 27 mmol/L (ref 22–32)
Calcium: 9.3 mg/dL (ref 8.9–10.3)
Chloride: 103 mmol/L (ref 98–111)
Creatinine, Ser: 0.89 mg/dL (ref 0.61–1.24)
GFR, Estimated: >60 mL/min (ref >60)
Glucose, Bld: 122 mg/dL — ABNORMAL HIGH (ref 70–99)
Potassium: 3.3 mmol/L — ABNORMAL LOW (ref 3.5–5.1)
Sodium: 138 mmol/L (ref 135–145)
Total Bilirubin: 0.7 mg/dL (ref 0.3–1.2)
Total Protein: 7 g/dL (ref 6.5–8.1)

## 2021-06-26 LAB — SURGICAL PCR SCREEN
MRSA, PCR: NEGATIVE
Staphylococcus aureus: NEGATIVE

## 2021-07-09 ENCOUNTER — Ambulatory Visit (HOSPITAL_COMMUNITY): Payer: Medicare HMO

## 2021-07-09 ENCOUNTER — Other Ambulatory Visit: Payer: Self-pay

## 2021-07-09 ENCOUNTER — Ambulatory Visit (HOSPITAL_COMMUNITY): Payer: Medicare HMO | Admitting: Registered Nurse

## 2021-07-09 ENCOUNTER — Ambulatory Visit (HOSPITAL_BASED_OUTPATIENT_CLINIC_OR_DEPARTMENT_OTHER): Payer: Medicare HMO | Admitting: Registered Nurse

## 2021-07-09 ENCOUNTER — Observation Stay (HOSPITAL_COMMUNITY)
Admission: RE | Admit: 2021-07-09 | Discharge: 2021-07-10 | Disposition: A | Discharge status: Home / Self Care | Payer: Medicare HMO | Source: Ambulatory Visit | Attending: Orthopedic Surgery | Admitting: Orthopedic Surgery

## 2021-07-09 ENCOUNTER — Encounter (HOSPITAL_COMMUNITY)
Admission: RE | Disposition: A | Discharge status: Home / Self Care | Payer: Self-pay | Source: Ambulatory Visit | Attending: Orthopedic Surgery

## 2021-07-09 ENCOUNTER — Observation Stay (HOSPITAL_COMMUNITY): Payer: Medicare HMO

## 2021-07-09 ENCOUNTER — Encounter (HOSPITAL_COMMUNITY): Payer: Self-pay | Admitting: Orthopedic Surgery

## 2021-07-09 DIAGNOSIS — F418 Other specified anxiety disorders: Secondary | ICD-10-CM | POA: Diagnosis not present

## 2021-07-09 DIAGNOSIS — I10 Essential (primary) hypertension: Secondary | ICD-10-CM | POA: Diagnosis not present

## 2021-07-09 DIAGNOSIS — Z96653 Presence of artificial knee joint, bilateral: Secondary | ICD-10-CM | POA: Insufficient documentation

## 2021-07-09 DIAGNOSIS — Z79899 Other long term (current) drug therapy: Secondary | ICD-10-CM | POA: Insufficient documentation

## 2021-07-09 DIAGNOSIS — Z96641 Presence of right artificial hip joint: Secondary | ICD-10-CM | POA: Insufficient documentation

## 2021-07-09 DIAGNOSIS — G4730 Sleep apnea, unspecified: Secondary | ICD-10-CM

## 2021-07-09 DIAGNOSIS — Z85828 Personal history of other malignant neoplasm of skin: Secondary | ICD-10-CM | POA: Diagnosis not present

## 2021-07-09 DIAGNOSIS — M1612 Unilateral primary osteoarthritis, left hip: Secondary | ICD-10-CM | POA: Diagnosis present

## 2021-07-09 DIAGNOSIS — M169 Osteoarthritis of hip, unspecified: Secondary | ICD-10-CM | POA: Diagnosis present

## 2021-07-09 DIAGNOSIS — I129 Hypertensive chronic kidney disease with stage 1 through stage 4 chronic kidney disease, or unspecified chronic kidney disease: Secondary | ICD-10-CM | POA: Diagnosis not present

## 2021-07-09 DIAGNOSIS — N189 Chronic kidney disease, unspecified: Secondary | ICD-10-CM | POA: Insufficient documentation

## 2021-07-09 HISTORY — PX: TOTAL HIP ARTHROPLASTY: SHX124

## 2021-07-09 SURGERY — ARTHROPLASTY, HIP, TOTAL, ANTERIOR APPROACH
Anesthesia: Spinal | Site: Hip | Laterality: Left

## 2021-07-09 MED ORDER — DEXAMETHASONE SODIUM PHOSPHATE 10 MG/ML IJ SOLN
10.0000 mg | Freq: Once | INTRAMUSCULAR | Status: AC
  Administered 2021-07-10: 10 mg via INTRAVENOUS
  Filled 2021-07-09: qty 1

## 2021-07-09 MED ORDER — MIDAZOLAM HCL 5 MG/5ML IJ SOLN
INTRAMUSCULAR | Status: DC | PRN
  Administered 2021-07-09 (×2): 1 mg via INTRAVENOUS

## 2021-07-09 MED ORDER — POTASSIUM CHLORIDE CRYS ER 20 MEQ PO TBCR
20.0000 meq | EXTENDED_RELEASE_TABLET | Freq: Two times a day (BID) | ORAL | Status: DC
  Administered 2021-07-09 – 2021-07-10 (×2): 20 meq via ORAL
  Filled 2021-07-09 (×2): qty 1

## 2021-07-09 MED ORDER — FENTANYL CITRATE (PF) 100 MCG/2ML IJ SOLN
INTRAMUSCULAR | Status: AC
  Filled 2021-07-09: qty 2

## 2021-07-09 MED ORDER — BISACODYL 10 MG RE SUPP: 10.0000 mg | Freq: Every day | RECTAL | Status: DC | PRN

## 2021-07-09 MED ORDER — AMLODIPINE BESYLATE 10 MG PO TABS
10.0000 mg | ORAL_TABLET | Freq: Every day | ORAL | Status: DC
  Administered 2021-07-10: 10 mg via ORAL
  Filled 2021-07-09: qty 1

## 2021-07-09 MED ORDER — DOCUSATE SODIUM 100 MG PO CAPS
100.0000 mg | ORAL_CAPSULE | Freq: Two times a day (BID) | ORAL | Status: DC
  Administered 2021-07-09 – 2021-07-10 (×2): 100 mg via ORAL
  Filled 2021-07-09 (×2): qty 1

## 2021-07-09 MED ORDER — RIVAROXABAN 10 MG PO TABS
10.0000 mg | ORAL_TABLET | Freq: Every day | ORAL | Status: DC
  Administered 2021-07-10: 10 mg via ORAL
  Filled 2021-07-09: qty 1

## 2021-07-09 MED ORDER — PANTOPRAZOLE SODIUM 40 MG PO TBEC
80.0000 mg | DELAYED_RELEASE_TABLET | Freq: Every day | ORAL | Status: DC
  Administered 2021-07-10: 80 mg via ORAL
  Filled 2021-07-09: qty 2

## 2021-07-09 MED ORDER — BUPIVACAINE-EPINEPHRINE (PF) 0.25% -1:200000 IJ SOLN
INTRAMUSCULAR | Status: DC | PRN
  Administered 2021-07-09: 30 mL

## 2021-07-09 MED ORDER — TRAMADOL HCL 50 MG PO TABS: 50.0000 mg | ORAL_TABLET | Freq: Four times a day (QID) | ORAL | Status: DC | PRN

## 2021-07-09 MED ORDER — FENTANYL CITRATE (PF) 100 MCG/2ML IJ SOLN
INTRAMUSCULAR | Status: DC | PRN
  Administered 2021-07-09: 50 ug via INTRAVENOUS

## 2021-07-09 MED ORDER — PHENOL 1.4 % MT LIQD: 1.0000 | OROMUCOSAL | Status: DC | PRN

## 2021-07-09 MED ORDER — PROPOFOL 500 MG/50ML IV EMUL
INTRAVENOUS | Status: DC | PRN
  Administered 2021-07-09: 75 ug/kg/min via INTRAVENOUS

## 2021-07-09 MED ORDER — MENTHOL 3 MG MT LOZG: 1.0000 | LOZENGE | OROMUCOSAL | Status: DC | PRN

## 2021-07-09 MED ORDER — DIPHENHYDRAMINE HCL 12.5 MG/5ML PO ELIX: 12.5000 mg | ORAL_SOLUTION | ORAL | Status: DC | PRN

## 2021-07-09 MED ORDER — ONDANSETRON HCL 4 MG/2ML IJ SOLN
INTRAMUSCULAR | Status: AC
  Filled 2021-07-09: qty 2

## 2021-07-09 MED ORDER — POVIDONE-IODINE 10 % EX SWAB
2.0000 "application " | Freq: Once | CUTANEOUS | Status: AC
  Administered 2021-07-09: 2 via TOPICAL

## 2021-07-09 MED ORDER — ONDANSETRON HCL 4 MG PO TABS: 4.0000 mg | ORAL_TABLET | Freq: Four times a day (QID) | ORAL | Status: DC | PRN

## 2021-07-09 MED ORDER — POLYETHYLENE GLYCOL 3350 17 G PO PACK: 17.0000 g | PACK | Freq: Every day | ORAL | Status: DC | PRN

## 2021-07-09 MED ORDER — METOCLOPRAMIDE HCL 5 MG/ML IJ SOLN: 5.0000 mg | Freq: Three times a day (TID) | INTRAMUSCULAR | Status: DC | PRN

## 2021-07-09 MED ORDER — EPHEDRINE SULFATE-NACL 50-0.9 MG/10ML-% IV SOSY
PREFILLED_SYRINGE | INTRAVENOUS | Status: DC | PRN
  Administered 2021-07-09: 5 mg via INTRAVENOUS
  Administered 2021-07-09 (×2): 10 mg via INTRAVENOUS

## 2021-07-09 MED ORDER — MORPHINE SULFATE (PF) 2 MG/ML IV SOLN
0.5000 mg | INTRAVENOUS | Status: DC | PRN
  Administered 2021-07-09 – 2021-07-10 (×2): 1 mg via INTRAVENOUS
  Filled 2021-07-09 (×2): qty 1

## 2021-07-09 MED ORDER — PHENYLEPHRINE HCL-NACL 20-0.9 MG/250ML-% IV SOLN
INTRAVENOUS | Status: DC | PRN
  Administered 2021-07-09: 25 ug/min via INTRAVENOUS

## 2021-07-09 MED ORDER — PROPOFOL 1000 MG/100ML IV EMUL
INTRAVENOUS | Status: AC
  Filled 2021-07-09: qty 100

## 2021-07-09 MED ORDER — ACETAMINOPHEN 500 MG PO TABS
1000.0000 mg | ORAL_TABLET | Freq: Once | ORAL | Status: AC
  Administered 2021-07-09: 1000 mg via ORAL
  Filled 2021-07-09: qty 2

## 2021-07-09 MED ORDER — OXYCODONE HCL 5 MG PO TABS: 5.0000 mg | ORAL_TABLET | Freq: Once | ORAL | Status: DC | PRN

## 2021-07-09 MED ORDER — BUPIVACAINE-EPINEPHRINE (PF) 0.25% -1:200000 IJ SOLN
INTRAMUSCULAR | Status: AC
  Filled 2021-07-09: qty 30

## 2021-07-09 MED ORDER — ACETAMINOPHEN 325 MG PO TABS: 325.0000 mg | ORAL_TABLET | Freq: Four times a day (QID) | ORAL | Status: DC | PRN

## 2021-07-09 MED ORDER — METHOCARBAMOL 500 MG PO TABS
500.0000 mg | ORAL_TABLET | Freq: Four times a day (QID) | ORAL | Status: DC | PRN
  Administered 2021-07-09 – 2021-07-10 (×2): 500 mg via ORAL
  Filled 2021-07-09 (×2): qty 1

## 2021-07-09 MED ORDER — LACTATED RINGERS IV SOLN: INTRAVENOUS | Status: DC

## 2021-07-09 MED ORDER — PHENYLEPHRINE HCL (PRESSORS) 10 MG/ML IV SOLN
INTRAVENOUS | Status: AC
  Filled 2021-07-09: qty 1

## 2021-07-09 MED ORDER — ONDANSETRON HCL 4 MG/2ML IJ SOLN: 4.0000 mg | Freq: Once | INTRAMUSCULAR | Status: DC | PRN

## 2021-07-09 MED ORDER — TRANEXAMIC ACID-NACL 1000-0.7 MG/100ML-% IV SOLN
1000.0000 mg | Freq: Once | INTRAVENOUS | Status: AC
  Administered 2021-07-09: 1000 mg via INTRAVENOUS
  Filled 2021-07-09: qty 100

## 2021-07-09 MED ORDER — DEXAMETHASONE SODIUM PHOSPHATE 10 MG/ML IJ SOLN
INTRAMUSCULAR | Status: AC
  Filled 2021-07-09: qty 1

## 2021-07-09 MED ORDER — VANCOMYCIN HCL IN DEXTROSE 1-5 GM/200ML-% IV SOLN
1000.0000 mg | Freq: Two times a day (BID) | INTRAVENOUS | Status: AC
  Administered 2021-07-09: 1000 mg via INTRAVENOUS
  Filled 2021-07-09: qty 200

## 2021-07-09 MED ORDER — GLYCOPYRROLATE 0.2 MG/ML IJ SOLN
INTRAMUSCULAR | Status: DC | PRN
  Administered 2021-07-09 (×2): .1 mg via INTRAVENOUS

## 2021-07-09 MED ORDER — 0.9 % SODIUM CHLORIDE (POUR BTL) OPTIME
TOPICAL | Status: DC | PRN
  Administered 2021-07-09: 1000 mL

## 2021-07-09 MED ORDER — HYDROCODONE-ACETAMINOPHEN 7.5-325 MG PO TABS
1.0000 | ORAL_TABLET | ORAL | Status: DC | PRN
  Administered 2021-07-10 (×3): 2 via ORAL
  Filled 2021-07-09 (×3): qty 2

## 2021-07-09 MED ORDER — VANCOMYCIN HCL 1500 MG/300ML IV SOLN
1500.0000 mg | INTRAVENOUS | Status: AC
  Administered 2021-07-09 (×2): 1500 mg via INTRAVENOUS
  Filled 2021-07-09: qty 300

## 2021-07-09 MED ORDER — MIDAZOLAM HCL 2 MG/2ML IJ SOLN
INTRAMUSCULAR | Status: AC
  Filled 2021-07-09: qty 2

## 2021-07-09 MED ORDER — SODIUM CHLORIDE 0.9 % IV SOLN: INTRAVENOUS | Status: DC

## 2021-07-09 MED ORDER — HYDROMORPHONE HCL 1 MG/ML IJ SOLN: 0.2500 mg | INTRAMUSCULAR | Status: DC | PRN

## 2021-07-09 MED ORDER — TRANEXAMIC ACID-NACL 1000-0.7 MG/100ML-% IV SOLN
1000.0000 mg | INTRAVENOUS | Status: AC
  Administered 2021-07-09: 1000 mg via INTRAVENOUS
  Filled 2021-07-09: qty 100

## 2021-07-09 MED ORDER — HYDROCODONE-ACETAMINOPHEN 5-325 MG PO TABS
1.0000 | ORAL_TABLET | ORAL | Status: DC | PRN
  Administered 2021-07-09 (×2): 2 via ORAL
  Filled 2021-07-09 (×2): qty 2

## 2021-07-09 MED ORDER — FLEET ENEMA 7-19 GM/118ML RE ENEM
1.0000 | ENEMA | Freq: Once | RECTAL | Status: DC | PRN
Start: 2021-07-09 — End: 2021-07-10

## 2021-07-09 MED ORDER — ALBUMIN HUMAN 5 % IV SOLN
INTRAVENOUS | Status: AC
  Filled 2021-07-09: qty 250

## 2021-07-09 MED ORDER — DEXAMETHASONE SODIUM PHOSPHATE 10 MG/ML IJ SOLN
8.0000 mg | Freq: Once | INTRAMUSCULAR | Status: AC
  Administered 2021-07-09: 10 mg via INTRAVENOUS

## 2021-07-09 MED ORDER — PROPOFOL 10 MG/ML IV BOLUS
INTRAVENOUS | Status: DC | PRN
  Administered 2021-07-09 (×3): 25 mg via INTRAVENOUS

## 2021-07-09 MED ORDER — PROPOFOL 10 MG/ML IV BOLUS
INTRAVENOUS | Status: AC
  Filled 2021-07-09: qty 20

## 2021-07-09 MED ORDER — OXYCODONE HCL 5 MG/5ML PO SOLN: 5.0000 mg | Freq: Once | ORAL | Status: DC | PRN

## 2021-07-09 MED ORDER — ONDANSETRON HCL 4 MG/2ML IJ SOLN
INTRAMUSCULAR | Status: DC | PRN
  Administered 2021-07-09: 4 mg via INTRAVENOUS

## 2021-07-09 MED ORDER — BUPIVACAINE IN DEXTROSE 0.75-8.25 % IT SOLN
INTRATHECAL | Status: DC | PRN
  Administered 2021-07-09: 2 mL via INTRATHECAL

## 2021-07-09 MED ORDER — VANCOMYCIN HCL IN DEXTROSE 1-5 GM/200ML-% IV SOLN: 1000.0000 mg | INTRAVENOUS | Status: DC

## 2021-07-09 MED ORDER — GLYCOPYRROLATE 0.2 MG/ML IJ SOLN
INTRAMUSCULAR | Status: AC
  Filled 2021-07-09: qty 1

## 2021-07-09 MED ORDER — CHLORTHALIDONE 25 MG PO TABS
12.5000 mg | ORAL_TABLET | Freq: Every day | ORAL | Status: DC
  Administered 2021-07-10: 12.5 mg via ORAL
  Filled 2021-07-09: qty 1

## 2021-07-09 MED ORDER — ONDANSETRON HCL 4 MG/2ML IJ SOLN: 4.0000 mg | Freq: Four times a day (QID) | INTRAMUSCULAR | Status: DC | PRN

## 2021-07-09 MED ORDER — ALBUMIN HUMAN 5 % IV SOLN: INTRAVENOUS | Status: DC | PRN

## 2021-07-09 MED ORDER — METOCLOPRAMIDE HCL 5 MG PO TABS: 5.0000 mg | ORAL_TABLET | Freq: Three times a day (TID) | ORAL | Status: DC | PRN

## 2021-07-09 MED ORDER — WATER FOR IRRIGATION, STERILE IR SOLN
Status: DC | PRN
  Administered 2021-07-09: 2000 mL

## 2021-07-09 SURGICAL SUPPLY — 46 items
BAG COUNTER SPONGE SURGICOUNT (BAG) ×1 IMPLANT
BAG DECANTER FOR FLEXI CONT (MISCELLANEOUS) IMPLANT
BAG SPEC THK2 15X12 ZIP CLS (MISCELLANEOUS) ×1
BAG SPNG CNTER NS LX DISP (BAG) ×1
BAG ZIPLOCK 12X15 (MISCELLANEOUS) ×1 IMPLANT
BLADE SAG 18X100X1.27 (BLADE) ×2 IMPLANT
CLSR STERI-STRIP ANTIMIC 1/2X4 (GAUZE/BANDAGES/DRESSINGS) ×2 IMPLANT
COVER PERINEAL POST (MISCELLANEOUS) ×2 IMPLANT
COVER SURGICAL LIGHT HANDLE (MISCELLANEOUS) ×2 IMPLANT
CUP ACETBLR 54 OD PINNACLE (Hips) ×1 IMPLANT
DRAPE FOOT SWITCH (DRAPES) ×2 IMPLANT
DRAPE IMP U-DRAPE 54X76 (DRAPES) ×2 IMPLANT
DRAPE STERI IOBAN 125X83 (DRAPES) ×2 IMPLANT
DRAPE U-SHAPE 47X51 STRL (DRAPES) ×4 IMPLANT
DRSG AQUACEL AG ADV 3.5X10 (GAUZE/BANDAGES/DRESSINGS) ×2 IMPLANT
DURAPREP 26ML APPLICATOR (WOUND CARE) ×2 IMPLANT
ELECT REM PT RETURN 15FT ADLT (MISCELLANEOUS) ×2 IMPLANT
GLOVE SRG 8 PF TXTR STRL LF DI (GLOVE) ×1 IMPLANT
GLOVE SURG ENC MOIS LTX SZ6.5 (GLOVE) ×2 IMPLANT
GLOVE SURG ENC MOIS LTX SZ7 (GLOVE) ×2 IMPLANT
GLOVE SURG ENC MOIS LTX SZ8 (GLOVE) ×4 IMPLANT
GLOVE SURG UNDER POLY LF SZ7 (GLOVE) ×2 IMPLANT
GLOVE SURG UNDER POLY LF SZ8 (GLOVE) ×2
GLOVE SURG UNDER POLY LF SZ8.5 (GLOVE) IMPLANT
GOWN STRL REUS W/ TWL LRG LVL3 (GOWN DISPOSABLE) ×2 IMPLANT
GOWN STRL REUS W/TWL LRG LVL3 (GOWN DISPOSABLE) ×4
HEAD M SROM 36MM PLUS 1.5 (Hips) IMPLANT
HOLDER FOLEY CATH W/STRAP (MISCELLANEOUS) ×2 IMPLANT
KIT TURNOVER KIT A (KITS) ×1 IMPLANT
LINER NEUTRAL 54X36MM PLUS 4 (Hips) ×1 IMPLANT
MANIFOLD NEPTUNE II (INSTRUMENTS) ×2 IMPLANT
PACK ANTERIOR HIP CUSTOM (KITS) ×2 IMPLANT
PENCIL SMOKE EVACUATOR COATED (MISCELLANEOUS) ×2 IMPLANT
SPIKE FLUID TRANSFER (MISCELLANEOUS) ×2 IMPLANT
SPONGE T-LAP 18X18 ~~LOC~~+RFID (SPONGE) ×4 IMPLANT
SROM M HEAD 36MM PLUS 1.5 (Hips) ×2 IMPLANT
STEM FEMORAL SZ6 HIGH ACTIS (Stem) ×1 IMPLANT
STRIP CLOSURE SKIN 1/2X4 (GAUZE/BANDAGES/DRESSINGS) ×2 IMPLANT
SUT ETHIBOND NAB CT1 #1 30IN (SUTURE) ×2 IMPLANT
SUT MNCRL AB 4-0 PS2 18 (SUTURE) ×2 IMPLANT
SUT STRATAFIX 0 PDS 27 VIOLET (SUTURE) ×2
SUT VIC AB 2-0 CT1 27 (SUTURE) ×4
SUT VIC AB 2-0 CT1 TAPERPNT 27 (SUTURE) ×2 IMPLANT
SUTURE STRATFX 0 PDS 27 VIOLET (SUTURE) ×1 IMPLANT
TRAY FOLEY MTR SLVR 16FR STAT (SET/KITS/TRAYS/PACK) ×2 IMPLANT
TUBE SUCTION HIGH CAP CLEAR NV (SUCTIONS) ×2 IMPLANT

## 2021-07-09 NOTE — Op Note (Signed)
?    OPERATIVE REPORT- TOTAL HIP ARTHROPLASTY ? ? ?PREOPERATIVE DIAGNOSIS: Osteoarthritis of the Left hip.  ? ?POSTOPERATIVE DIAGNOSIS: Osteoarthritis of the Left  hip.  ? ?PROCEDURE: Left total hip arthroplasty, anterior approach.  ? ?SURGEON: Gaynelle Arabian, MD  ? ?ASSISTANT: Kandee Keen, PA-C ? ?ANESTHESIA:  Spinal ? ?ESTIMATED BLOOD LOSS:-1050 mL   ? ?DRAINS: Hemovac x1.  ? ?COMPLICATIONS: None ?  ?CONDITION: PACU - hemodynamically stable.  ? ?BRIEF CLINICAL NOTE: AKSHAT MINEHART is a 75 y.o. male who has advanced end-  ?stage arthritis of their Left  hip with progressively worsening pain and  ?dysfunction.The patient has failed nonoperative management and presents for  ?total hip arthroplasty.  ? ?PROCEDURE IN DETAIL: After successful administration of spinal  ?anesthetic, the traction boots for the [facility] bed were placed on both  ?feet and the patient was placed onto the [facility] bed, boots placed into the leg  ?holders. The Left hip was then isolated from the perineum with plastic  ?drapes and prepped and draped in the usual sterile fashion. ASIS and  ?greater trochanter were marked and a oblique incision was made, starting  ?at about 1 cm lateral and 2 cm distal to the ASIS and coursing towards  ?the anterior cortex of the femur. The skin was cut with a 10 blade  ?through subcutaneous tissue to the level of the fascia overlying the  ?tensor fascia lata muscle. The fascia was then incised in line with the  ?incision at the junction of the anterior third and posterior 2/3rd. The  ?muscle was teased off the fascia and then the interval between the TFL  ?and the rectus was developed. The Hohmann retractor was then placed at  ?the top of the femoral neck over the capsule. The vessels overlying the  ?capsule were cauterized and the fat on top of the capsule was removed.  ?A Hohmann retractor was then placed anterior underneath the rectus  ?femoris to give exposure to the entire anterior capsule. A T-shaped   ?capsulotomy was performed. The edges were tagged and the femoral head  ?was identified. ?      Osteophytes are removed off the superior acetabulum.  ?The femoral neck was then cut in situ with an oscillating saw. Traction  ?was then applied to the left lower extremity utilizing the [facility]  ?traction. The femoral head was then removed. Retractors were placed  ?around the acetabulum and then circumferential removal of the labrum was  ?performed. Osteophytes were also removed. Reaming starts at 49 mm to  ?medialize and  Increased in 2 mm increments to 53 mm. We reamed in  ?approximately 40 degrees of abduction, 20 degrees anteversion. A 54 mm  ?pinnacle acetabular shell was then impacted in anatomic position under  ?fluoroscopic guidance with excellent purchase. We did not need to place  ?any additional dome screws. A 36 mm neutral + 4 marathon liner was then  ?placed into the acetabular shell.  ?     The femoral lift was then placed along the lateral aspect of the femur  ?just distal to the vastus ridge. The leg was  externally rotated and capsule  ?was stripped off the inferior aspect of the femoral neck down to the  ?level of the lesser trochanter, this was done with electrocautery. The femur was lifted after this was performed. The  leg was then placed in an extended and adducted position essentially delivering the femur. We also removed the capsule superiorly and the piriformis from the piriformis  fossa to gain excellent exposure of the  ?proximal femur. Rongeur was used to remove some cancellous bone to get  ?into the lateral portion of the proximal femur for placement of the  ?initial starter reamer. The starter broaches was placed  the starter broach  and was shown to go down the center of the canal. Broaching  ?with the Actis system was then performed starting at size 0  coursing  ?Up to size 6. A size 6 had excellent torsional and rotational  ?and axial stability. The trial standard offset neck was then  placed  ?with a 36 + 1.5 trial head. The hip was then reduced. We confirmed that  ?the stem was in the canal both on AP and lateral x-rays. It also has excellent sizing. The hip was reduced with outstanding stability through full extension and full external rotation.. AP pelvis was taken and the leg lengths were measured and found to be equal. Hip was then dislocated again and the femoral head and neck removed. The  ?femoral broach was removed. Size 6 Actis stem with a standard offset  ?neck was then impacted into the femur following native anteversion. Has  ?excellent purchase in the canal. Excellent torsional and rotational and  ?axial stability. It is confirmed to be in the canal on AP and lateral  ?fluoroscopic views. The 36 + 1.5 metal head was placed and the hip  ?reduced with outstanding stability. Again AP pelvis was taken and it  ?confirmed that the leg lengths were equal. The wound was then copiously  ?irrigated with saline solution and the capsule reattached and repaired  ?with Ethibond suture. 30 ml of .25% Bupivicaine was  injected into the capsule and into the edge of the tensor fascia lata as well as subcutaneous tissue. The fascia overlying the tensor fascia lata was then closed with a running #1 V-Loc. Subcu was closed with interrupted 2-0 Vicryl and subcuticular running 4-0 Monocryl. Incision was cleaned  ?and dried. Steri-Strips and a bulky sterile dressing applied. The patient was awakened and transported to  ?recovery in stable condition.  ?      Please note that a surgical assistant was a medical necessity for this procedure to perform it in a safe and expeditious manner. Assistant was necessary to provide appropriate retraction of vital neurovascular structures and to prevent femoral fracture and allow for anatomic placement of the prosthesis. ? ?Gaynelle Arabian, M.D.  ? ? ?

## 2021-07-09 NOTE — Plan of Care (Signed)
  Problem: Clinical Measurements: Goal: Ability to maintain clinical measurements within normal limits will improve Outcome: Progressing   Problem: Activity: Goal: Risk for activity intolerance will decrease Outcome: Progressing   Problem: Pain Managment: Goal: General experience of comfort will improve Outcome: Progressing   Problem: Safety: Goal: Ability to remain free from injury will improve Outcome: Progressing   

## 2021-07-09 NOTE — H&P (Signed)
TOTAL HIP ADMISSION H&P ? ?Patient is admitted for left total hip arthroplasty. ? ?Subjective: ? ?Chief Complaint: Left hip pain ? ?HPI: Jeffrey [name], 76 y.o. male, has a history of pain and functional disability in the left hip due to arthritis and patient has failed non-surgical conservative treatments for greater than 12 weeks to include NSAID's and/or analgesics and activity modification. Onset of symptoms was gradual, starting  several  years ago with gradually worsening course since that time. The patient noted no past surgery on the left hip. Patient currently rates pain in the left hip at 7 out of 10 with activity. Patient has worsening of pain with activity and weight bearing, pain that interfers with activities of daily living, and pain with passive range of motion. Patient has evidence of periarticular osteophytes and joint space narrowing by imaging studies. This condition presents safety issues increasing the risk of falls. There is no current active infection. ? ?Patient Active Problem List  ? Diagnosis Date Noted  ? Essential tremor 12/06/2013  ? 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 Anxiety state, unspecified 12/06/2013  ? ? ?Past Medical History:  ?Diagnosis Date  ? Anal fissure   ? Anemia   ? Anxiety   ? Arthritis   ? Barrett esophagus   ? BPH (benign prostatic hyperplasia)   ? Bruises easily   ? Cancer [facility])   ? skin cancer removed with MOHS  ? 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 followed by neurology. 2 year follow up from now.  ? Chronic kidney disease   ? DDD (degenerative disc disease), lumbar   ? Depression   ? ED (erectile dysfunction)   ? Esophageal candidiasis (HFingal 09/2019  ? treated with fluconazole for 2 weeks.   ? GERD (gastroesophageal reflux disease)   ? Headache 10/2019  ? silent migraine causes vision problems  ? History of hiatal hernia   ? Hyperlipidemia   ? Hypertension   ? Lumbar radiculitis   ? Multiple thyroid nodules   ? Sleep  apnea   ? does not use a cpap machine.   ? Tremors of nervous system   ? Vitiligo   ? ? ?Past Surgical History:  ?Procedure Laterality Date  ? APPENDECTOMY    ? cheek surgery Right   ? stainless steel in right cheek  ? COLONOSCOPY    ? COLONOSCOPY WITH PROPOFOL N/A 02/19/2017  ? Procedure: COLONOSCOPY WITH PROPOFOL;  Surgeon: Toledo, TBenay Pike MD;  Location: [facility] ENDOSCOPY;  Service: Gastroenterology;  Laterality: N/A;  ? ESOPHAGOGASTRODUODENOSCOPY    ? ESOPHAGOGASTRODUODENOSCOPY (EGD) WITH PROPOFOL N/A 02/19/2017  ? Procedure: ESOPHAGOGASTRODUODENOSCOPY (EGD) WITH PROPOFOL;  Surgeon: Toledo, TBenay Pike MD;  Location: [facility] ENDOSCOPY;  Service: Gastroenterology;  Laterality: N/A;  ? ESOPHAGOGASTRODUODENOSCOPY (EGD) WITH PROPOFOL N/A 09/27/2019  ? Procedure: ESOPHAGOGASTRODUODENOSCOPY (EGD) WITH PROPOFOL;  Surgeon: Toledo, TBenay Pike MD;  Location: [facility] ENDOSCOPY;  Service: Gastroenterology;  Laterality: N/A;  ? FUNCTIONAL ENDOSCOPIC SINUS SURGERY    ? uvuloplasty done for OSA  ? HERNIA REPAIR    ? hiatal  ? JOINT REPLACEMENT Right 2012  ? THR  ? JOINT REPLACEMENT Bilateral   ? TKR  ? ? ?Prior to Admission medications   ?Medication Sig Start Date End Date Taking? Authorizing Provider  ?amLODipine (NORVASC) 10 MG tablet Take 10 mg by mouth daily.  10/27/13  Yes [provider]  ?Ascorbic Acid (VITAMIN C PO) Take 1 tablet by mouth daily.   Yes [provider]  ?  b complex vitamins capsule Take 1 capsule by mouth daily.   Yes [provider]  ?chlorthalidone (HYGROTON) 25 MG tablet Take 12.5 mg daily by mouth.   Yes [provider]  ?cholecalciferol (VITAMIN D3) 25 MCG (1000 UNIT) tablet Take 1,000 Units by mouth daily.   Yes [provider]  ?clobetasol cream (TEMOVATE) 3.79 % Apply 1 application. topically daily as needed (Dry skin on fingers).   Yes [provider]  ?clotrimazole-betamethasone (LOTRISONE) cream Apply 1 application. topically daily as needed (Rash).    Yes [provider]  ?HYDROcodone-acetaminophen (NORCO/VICODIN) 5-325 MG tablet Take 0.5 tablets by mouth 2 (two) times daily as needed for pain. 04/22/21  Yes [provider]  ?lisinopril (PRINIVIL,ZESTRIL) 40 MG tablet Take 40 mg by mouth daily. 10/23/13  Yes [provider]  ?Multiple Vitamins-Minerals (PRESERVISION AREDS 2+MULTI VIT PO) Take 1 capsule by mouth in the morning and at bedtime.   Yes [provider]  ?naproxen (NAPROSYN) 500 MG tablet Take 500 mg by mouth daily as needed for mild pain or moderate pain.   Yes [provider]  ?Omega-3 Fatty Acids (FISH OIL PO) Take 1 tablet by mouth daily.   Yes [provider]  ?omeprazole (PRILOSEC) 40 MG capsule Take 40 mg by mouth in the morning. 11/03/13  Yes [provider]  ?potassium chloride SA (K-DUR,KLOR-CON) 20 MEQ tablet Take 20 mEq by mouth 2 (two) times daily.   Yes [provider]  ?tadalafil (CIALIS) 5 MG tablet Take 5 mg by mouth daily. 04/22/21  Yes [provider]  ?VITAMIN E PO Take by mouth.   Yes [provider]  ? ? ?Allergies  ?Allergen Reactions  ? Paroxetine Hcl Other (See Comments)  ?  Anger, not a nice person  ? Mirtazapine Other (See Comments)  ?  unkn  ? Atorvastatin Other (See Comments)  ?  Eye pain/ temple pain  ? Cefdinir Itching and Rash  ?  Rash-gets blistery looking rash  ? Ciprofloxacin Rash  ? Doxazosin Anxiety  ? Fluoxetine Other (See Comments)  ?  Sexual side effects  ? ? ?Social History  ? ?Socioeconomic History  ? Marital status: Married  ?  Spouse name: Wells Guiles  ? Number of children: 2  ? Years of education: college  ? Highest education level: Not on file  ?Occupational History  ? Occupation: Press photographer at a [employer]  ?  Employer: RETIRED  ?Tobacco Use  ? Smoking status: Never  ? Smokeless tobacco: Never  ?Vaping Use  ? Vaping Use: Never used  ?Substance and Sexual Activity  ? Alcohol use: Not Currently  ?  Comment: OCC.  ? Drug use:  No  ? Sexual activity: Not Currently  ?Other Topics Concern  ? Not on file  ?Social History Narrative  ? Patient lives with just wife.  ? ?Social Determinants of Health  ? ?Financial Resource Strain: Not on file  ?Food Insecurity: Not on file  ?Transportation Needs: Not on file  ?Physical Activity: Not on file  ?Stress: Not on file  ?Social Connections: Not on file  ?Intimate Partner Violence: Not on file  ? ? ?Tobacco Use: Low Risk   ? Smoking Tobacco Use: Never  ? Smokeless Tobacco Use: Never  ? Passive Exposure: Not on file  ? ?Social History  ? ?Substance and Sexual Activity  ?Alcohol Use Not Currently  ? Comment: OCC.  ? ? ?No family history on file. ? ?ROS: ?Constitutional: no fever, no chills, no  night sweats, no significant weight loss ?Cardiovascular: no chest pain, no palpitations ?Respiratory: no cough, no shortness of breath, No COPD ?Gastrointestinal: no vomiting, no nausea ?Musculoskeletal: no swelling in Joints, Joint Pain ?Neurologic: no numbness, no tingling, no difficulty with balance ? ? ? ?Objective: ? ?Physical Exam: ?Well nourished and well developed.  ?General: Alert and oriented x3, cooperative and pleasant, no acute distress.  ?Head: normocephalic, atraumatic, neck supple.  ?Eyes: EOMI.  ?Respiratory: breath sounds clear in all fields, no wheezing, rales, or rhonchi. ?Cardiovascular: Regular rate and rhythm, no murmurs, gallops or rubs.  ?Abdomen: non-tender to palpation and soft, normoactive bowel sounds. ?Musculoskeletal: ? ? Slightly antalgic gait pattern favoring the left side without the use of assisted devices.  ?  ? Left Hip Exam:  ? ROM: Flexion to 110, Internal Rotation 0, External Rotation 20 to 30, and abduction 20-30 without discomfort.  ? There is no tenderness over the greater trochanter.  ? There is no pain on provocative testing of the hip. ? ?Calves soft and nontender. Motor function intact in LE. Strength 5/5 LE bilaterally. ?Neuro: Distal pulses 2+. Sensation to light  touch intact in LE. ? ? ? ? ?Vital signs in last 24 hours: ?  ? ?Imaging Review ?Radiographs- AP pelvis, lateral of the left hip dated 06/20/2020 demonstrate bone-on-bone arthritis in the left hip with oste

## 2021-07-09 NOTE — Interval H&P Note (Signed)
History and Physical Interval Note: ? ?07/09/2021 ?10:19 AM ? ?Jeffrey [name]  has presented today for surgery, with the diagnosis of left hip osteoarthritis.  The various methods of treatment have been discussed with the patient and family. After consideration of risks, benefits and other options for treatment, the patient has consented to  Procedure(s): ?TOTAL HIP ARTHROPLASTY ANTERIOR APPROACH (Left) as a surgical intervention.  The patient's history has been reviewed, patient examined, no change in status, stable for surgery.  I have reviewed the patient's chart and labs.  Questions were answered to the patient's satisfaction.   ? ? ?Pilar Plate [name] ? ? ?

## 2021-07-09 NOTE — Transfer of Care (Signed)
Immediate Anesthesia Transfer of Care Note ? ?Patient: Jeffrey [name] ? ?Procedure(s) Performed: TOTAL HIP ARTHROPLASTY ANTERIOR APPROACH (Left: Hip) ? ?Patient Location: PACU ? ?Anesthesia Type:Spinal ? ?Level of Consciousness: sedated and patient cooperative ? ?Airway & Oxygen Therapy: Patient Spontanous Breathing and Patient connected to face mask oxygen ? ?Post-op Assessment: Report given to RN and Post -op Vital signs reviewed and stable ? ?Post vital signs: stable ? ?Last Vitals:  ?Vitals Value Taken Time  ?BP 99/71 07/09/21 1356  ?Temp    ?Pulse 67 07/09/21 1400  ?Resp 11 07/09/21 1400  ?SpO2 94 % 07/09/21 1400  ?Vitals shown include unvalidated device data. ? ?Last Pain:  ?Vitals:  ? 07/09/21 0943  ?TempSrc: Oral  ?PainSc: 0-No pain  ?   ? ?  ? ?Complications: No notable events documented. ?

## 2021-07-09 NOTE — Anesthesia Procedure Notes (Signed)
Date/Time: 07/09/2021 11:45 AM ?Performed by: Lissa Morales, CRNA ?Oxygen Delivery Method: Simple face mask ?Placement Confirmation: positive ETCO2 and breath sounds checked- equal and bilateral ?Dental Injury: Teeth and Oropharynx as per pre-operative assessment  ? ? ? ? ?

## 2021-07-09 NOTE — Anesthesia Postprocedure Evaluation (Signed)
Anesthesia Post Note ? ?Patient: Jeffrey [name] ? ?Procedure(s) Performed: TOTAL HIP ARTHROPLASTY ANTERIOR APPROACH (Left: Hip) ? ?  ? ?Patient location during evaluation: PACU ?Anesthesia Type: Spinal ?Level of consciousness: oriented and awake and alert ?Pain management: pain level controlled ?Vital Signs Assessment: post-procedure vital signs reviewed and stable ?Respiratory status: spontaneous breathing, respiratory function stable and nonlabored ventilation ?Cardiovascular status: blood pressure returned to baseline and stable ?Postop Assessment: no headache, no backache, no apparent nausea or vomiting, spinal receding and patient able to bend at knees ?Anesthetic complications: no ? ? ?No notable events documented. ? ?Last Vitals:  ?Vitals:  ? 07/09/21 1500 07/09/21 1515  ?BP: 104/84 116/85  ?Pulse: 63 75  ?Resp: 17 11  ?Temp: 36.6 ?C   ?SpO2: 100% 100%  ?  ?Last Pain:  ?Vitals:  ? 07/09/21 1515  ?TempSrc:   ?PainSc: 0-No pain  ? ? ?LLE Motor Response: Purposeful movement (07/09/21 1515) ?LLE Sensation: Increased (07/09/21 1515) ?RLE Motor Response: Purposeful movement (07/09/21 1515) ?RLE Sensation: Increased (07/09/21 1515) ?L Sensory Level: L5-Outer lower leg, top of foot, great toe (07/09/21 1515) ?R Sensory Level: L5-Outer lower leg, top of foot, great toe (07/09/21 1515) ? ?[name] A. ? ? ? ? ?

## 2021-07-09 NOTE — Anesthesia Preprocedure Evaluation (Addendum)
Anesthesia Evaluation  ?Patient identified by MRN, date of birth, ID band ?Patient awake ? ? ? ?Reviewed: ?Allergy & Precautions, NPO status , Patient's Chart, lab work & pertinent test results, reviewed documented beta blocker date and time  ? ?Airway ?Mallampati: II ? ?TM Distance: >3 FB ?Neck ROM: Full ? ? ? Dental ? ?(+) Poor Dentition, Dental Advisory Given ?  ?Pulmonary ?sleep apnea ,  ?  ?Pulmonary exam normal ?breath sounds clear to auscultation ? ? ? ? ? ? Cardiovascular ?hypertension, Pt. on medications ? ?Rhythm:Regular Rate:Bradycardia ? ?71m saccular aneurysm left ophthalmic artery ?  ?Neuro/Psych ? Headaches, PSYCHIATRIC DISORDERS Anxiety Depression Lumbar radiculitis ?Essential tremors ? Neuromuscular disease   ? GI/Hepatic ?Neg liver ROS, hiatal hernia, GERD  Medicated and Controlled,Hx/o Barrett's esophagus ?  ?Endo/Other  ?Multiple thyroid nodules ?Obesity ? Renal/GU ?Renal disease  ? ?BPH ? ?  ?Musculoskeletal ? ?(+) Arthritis , Osteoarthritis,  OA left hip  ? Abdominal ?(+) + obese,   ?Peds ? Hematology ? ?(+) Blood dyscrasia, anemia ,   ?Anesthesia Other Findings ? ? Reproductive/Obstetrics ?ED ? ?  ? ? ? ? ? ? ? ? ? ? ? ? ? ?  ?  ? ? ? ? ? ? ? ?Anesthesia Physical ?Anesthesia Plan ? ?ASA: 3 ? ?Anesthesia Plan: Spinal  ? ?Post-op Pain Management: Tylenol PO (pre-op)*  ? ?Induction: Intravenous ? ?PONV Risk Score and Plan: 2 and Treatment may vary due to age or medical condition and Propofol infusion ? ?Airway Management Planned: Natural Airway and Simple Face Mask ? ?Additional Equipment: None ? ?Intra-op Plan:  ? ?Post-operative Plan:  ? ?Informed Consent: I have reviewed the patients History and Physical, chart, labs and discussed the procedure including the risks, benefits and alternatives for the proposed anesthesia with the patient or authorized representative who has indicated his/her understanding and acceptance.  ? ? ? ?Dental advisory given ? ?Plan  Discussed with: CRNA and Anesthesiologist ? ?Anesthesia Plan Comments:   ? ? ? ? ? ? ?Anesthesia Quick Evaluation ? ?

## 2021-07-09 NOTE — Discharge Instructions (Addendum)
?Jeffrey Arabian, MD ?Total Joint Specialist ?[facility] ?[address] ?[place], [place] [address] ?[phone] ? ?ANTERIOR APPROACH TOTAL HIP REPLACEMENT POSTOPERATIVE DIRECTIONS ? ? ? ? ?Hip Rehabilitation, Guidelines Following Surgery  ?The results of a hip operation are greatly improved after range of motion and muscle strengthening exercises. Follow all safety measures which are given to protect your hip. If any of these exercises cause increased pain or swelling in your joint, decrease the amount until you are comfortable again. Then slowly increase the exercises. Call your caregiver if you have problems or questions.  ? ?BLOOD CLOT PREVENTION ?Take a 10 mg Xarelto once a day for three weeks following surgery. Then take an 81 mg Aspirin once a day for three weeks. Then discontinue Aspirin. ?You may resume your vitamins/supplements once you have discontinued the Xarelto. ?Do not take any NSAIDs (Advil, Aleve, Ibuprofen, Meloxicam, etc.) until you have discontinued the Xarelto.  ? ?HOME CARE INSTRUCTIONS  ?Remove items at home which could result in a fall. This includes throw rugs or furniture in walking pathways.  ?ICE to the affected hip as frequently as 20-30 minutes an hour and then as needed for pain and swelling. Continue to use ice on the hip for pain and swelling from surgery. You may notice swelling that will progress down to the foot and ankle. This is normal after surgery. Elevate the leg when you are not up walking on it.   ?Continue to use the breathing machine which will help keep your temperature down.  It is common for your temperature to cycle up and down following surgery, especially at night when you are not up moving around and exerting yourself.  The breathing machine keeps your lungs expanded and your temperature down. ? ?DIET ?You may resume your previous home diet once your are discharged from the hospital. ? ?DRESSING / WOUND CARE / SHOWERING ?You have an  adhesive waterproof bandage over the incision. Leave this in place until your first follow-up appointment. Once you remove this you will not need to place another bandage.  ?You may begin showering 3 days following surgery, but do not submerge the incision under water. ? ?ACTIVITY ?For the first 3-5 days, it is important to rest and keep the operative leg elevated. You should, as a general rule, rest for 50 minutes and walk/stretch for 10 minutes per hour. After 5 days, you may slowly increase activity as tolerated.  ?Perform the exercises you were provided twice a day for about 15-20 minutes each session. Begin these 2 days following surgery. ?Walk with your walker as instructed. Use the walker until you are comfortable transitioning to a cane. Walk with the cane in the opposite hand of the operative leg. You may discontinue the cane once you are comfortable and walking steadily. ?Avoid periods of inactivity such as sitting longer than an hour when not asleep. This helps prevent blood clots.  ?Do not drive a car for 6 weeks or until released by your surgeon.  ?Do not drive while taking narcotics. ? ?TED HOSE STOCKINGS ?Wear the elastic stockings on both legs for three weeks following surgery during the day. You may remove them at night while sleeping. ? ?WEIGHT BEARING ?Weight bearing as tolerated with assist device (walker, cane, etc) as directed, use it as long as suggested by your surgeon or therapist, typically at least 4-6 weeks. ? ?POSTOPERATIVE CONSTIPATION PROTOCOL ?Constipation - defined medically as fewer than three stools per week and severe constipation as less  than one stool per week. ? ?One of the most common issues patients have following surgery is constipation.  Even if you have a regular bowel pattern at home, your normal regimen is likely to be disrupted due to multiple reasons following surgery.  Combination of anesthesia, postoperative narcotics, change in appetite and fluid intake all can  affect your bowels.  In order to avoid complications following surgery, here are some recommendations in order to help you during your recovery period. ? ?Colace (docusate) - Pick up an over-the-counter form of Colace or another stool softener and take twice a day as long as you are requiring postoperative pain medications.  Take with a full glass of water daily.  If you experience loose stools or diarrhea, hold the colace until you stool forms back up.  If your symptoms do not get better within 1 week or if they get worse, check with your doctor. ?Dulcolax (bisacodyl) - Pick up over-the-counter and take as directed by the product packaging as needed to assist with the movement of your bowels.  Take with a full glass of water.  Use this product as needed if not relieved by Colace only.  ?MiraLax (polyethylene glycol) - Pick up over-the-counter to have on hand.  MiraLax is a solution that will increase the amount of water in your bowels to assist with bowel movements.  Take as directed and can mix with a glass of water, juice, soda, coffee, or tea.  Take if you go more than two days without a movement.Do not use MiraLax more than once per day. Call your doctor if you are still constipated or irregular after using this medication for 7 days in a row. ? ?If you continue to have problems with postoperative constipation, please contact the office for further assistance and recommendations.  If you experience "the worst abdominal pain ever" or develop nausea or vomiting, please contact the office immediatly for further recommendations for treatment. ? ?ITCHING ? If you experience itching with your medications, try taking only a single pain pill, or even half a pain pill at a time.  You can also use Benadryl over the counter for itching or also to help with sleep.  ? ?MEDICATIONS ?See your medication summary on the ?After Visit Summary? that the nursing staff will review with you prior to discharge.  You may have some home  medications which will be placed on hold until you complete the course of blood thinner medication.  It is important for you to complete the blood thinner medication as prescribed by your surgeon.  Continue your approved medications as instructed at time of discharge. ? ?PRECAUTIONS ?If you experience chest pain or shortness of breath - call 911 immediately for transfer to the hospital emergency department.  ?If you develop a fever greater that 101 F, purulent drainage from wound, increased redness or drainage from wound, foul odor from the wound/dressing, or calf pain - CONTACT YOUR SURGEON.   ?                                                ?FOLLOW-UP APPOINTMENTS ?Make sure you keep all of your appointments after your operation with your surgeon and caregivers. You should call the office at the above phone number and make an appointment for approximately two weeks after the date of your surgery or on the date  instructed by your surgeon outlined in the "After Visit Summary". ? ?RANGE OF MOTION AND STRENGTHENING EXERCISES  ?These exercises are designed to help you keep full movement of your hip joint. Follow your caregiver's or physical therapist's instructions. Perform all exercises about fifteen times, three times per day or as directed. Exercise both hips, even if you have had only one joint replacement. These exercises can be done on a training (exercise) mat, on the floor, on a table or on a bed. Use whatever works the best and is most comfortable for you. Use music or television while you are exercising so that the exercises are a pleasant break in your day. This will make your life better with the exercises acting as a break in routine you can look forward to.  ?Lying on your back, slowly slide your foot toward your buttocks, raising your knee up off the floor. Then slowly slide your foot back down until your leg is straight again.  ?Lying on your back spread your legs as far apart as you can without causing  discomfort.  ?Lying on your side, raise your upper leg and foot straight up from the floor as far as is comfortable. Slowly lower the leg and repeat.  ?Lying on your back, tighten up the muscle in the fron

## 2021-07-09 NOTE — Anesthesia Procedure Notes (Signed)
Spinal ? ?Patient location during procedure: OR ?Start time: 07/09/2021 11:34 AM ?End time: 07/09/2021 11:46 AM ?Reason for block: surgical anesthesia ?Staffing ?Performed: anesthesiologist and resident/CRNA  ?Anesthesiologist: Josephine Igo, MD ?Resident/CRNA: Lissa Morales, CRNA ?Preanesthetic Checklist ?Completed: patient identified, IV checked, site marked, risks and benefits discussed, surgical consent, monitors and equipment checked, pre-op evaluation and timeout performed ?Spinal Block ?Patient position: sitting ?Prep: DuraPrep and site prepped and draped ?Patient monitoring: heart rate, cardiac monitor, continuous pulse ox and blood pressure ?Approach: midline ?Location: L3-4 ?Injection technique: single-shot ?Needle ?Needle type: Pencan  ?Needle gauge: 24 G ?Needle length: 9 cm ?Needle insertion depth: 7 cm ?Assessment ?Sensory level: T4 ?Events: CSF return and second provider ?Additional Notes ?Patient tolerated procedure well. Adequate sensory level. ? ? ? ? ? ?

## 2021-07-09 NOTE — Evaluation (Signed)
Physical Therapy Evaluation ?Patient Details ?Name: Jeffrey [name] ?MRN: [id number] ?DOB: Sep 15, 1945 ?Today's Date: 07/09/2021 ? ?History of Present Illness ? Pt is a 76yo male presenting s/p L-THA, anterior approach on 07/09/21. PMH: anxiety & depression, OA, BPH, CKD, HTN, GERD, cerebral aneurysm, R-THA 2012, bilateral TKR  ?Clinical Impression ? Jeffrey GUADAMUZ is a 76 y.o. male POD 0 s/p left THA anterior approach. Patient reports independence with mobility at baseline. Patient is now limited by functional impairments (see PT problem list below) and requires min guard for transfers and gait with RW. Patient was able to ambulate 8 feet with RW and min guard assist. Patient instructed in exercise to facilitate ROM and circulation to manage edema. Patient will benefit from continued skilled PT interventions to address impairments and progress towards PLOF. Acute PT will follow to progress mobility and stair training in preparation for safe discharge home. ?   ?   ? ?Recommendations for follow up therapy are one component of a multi-disciplinary discharge planning process, led by the attending physician.  Recommendations may be updated based on patient status, additional functional criteria and insurance authorization. ? ?Follow Up Recommendations PT at Long-term acute care hospital ? ?  ?Assistance Recommended at Discharge Set up Supervision/Assistance  ?Patient can return home with the following ? A little help with walking and/or transfers;A little help with bathing/dressing/bathroom;Assistance with cooking/housework;Assist for transportation;Help with stairs or ramp for entrance ? ?  ?Equipment Recommendations None recommended by PT (pt has recommended dme)  ?Recommendations for Other Services ?    ?  ?Functional Status Assessment Patient has had a recent decline in their functional status and demonstrates the ability to make significant improvements in function in a reasonable and predictable amount of time.   ? ?  ?Precautions / Restrictions Precautions ?Precautions: Fall ?Restrictions ?Weight Bearing Restrictions: Yes ?RLE Weight Bearing: Weight bearing as tolerated ?LLE Weight Bearing: Weight bearing as tolerated  ? ?  ? ?Mobility ? Bed Mobility ?Overal bed mobility: Needs Assistance ?Bed Mobility: Supine to Sit, Sit to Supine ?  ?  ?Supine to sit: Min assist, HOB elevated ?Sit to supine: Min assist ?  ?General bed mobility comments: Pt min assist for LE advancement off and on bed, VCs for sequencing. Pt reported lightheadedness at start of treatment, monitored throughout and no increase in symptoms. ?  ? ?Transfers ?Overall transfer level: Needs assistance ?Equipment used: Rolling walker (2 wheels) ?Transfers: Sit to/from Stand ?Sit to Stand: Min guard ?  ?  ?  ?  ?  ?General transfer comment: Pt min guard for safety and line management, no physical assist required. VCs for hand placement and sequencing. ?  ? ?Ambulation/Gait ?Ambulation/Gait assistance: Min guard ?Gait Distance (Feet): 8 Feet ?Assistive device: Rolling walker (2 wheels) ?Gait Pattern/deviations: Step-to pattern, Decreased step length - left, Decreased stance time - left ?Gait velocity: decreased ?  ?  ?General Gait Details: Pt ambulated ~33f in room with min guard assist, no physical assist required. Demonstrated step-to pattern with reduced stance time on the L. VC for RW management and proximityt o device. No overt LOB noted. ? ?Stairs ?  ?  ?  ?  ?  ? ?Wheelchair Mobility ?  ? ?Modified Rankin (Stroke Patients Only) ?  ? ?  ? ?Balance Overall balance assessment: Needs assistance ?Sitting-balance support: Feet supported, No upper extremity supported ?Sitting balance-Leahy Scale: Fair ?  ?  ?Standing balance support: Bilateral upper extremity supported, During functional activity, Reliant on assistive device for balance ?  Standing balance-Leahy Scale: Poor ?  ?  ?  ?  ?  ?  ?  ?  ?  ?  ?  ?  ?   ? ? ? ?Pertinent Vitals/Pain Pain Assessment ?Pain  Assessment: 0-10 ?Pain Score: 2  ?Pain Location: L THA ?Pain Descriptors / Indicators: Operative site guarding, Discomfort  ? ? ?Home Living Family/patient expects to be discharged to:: Private residence ?Living Arrangements: Spouse/significant other ([name]) ?Available Help at Discharge: Family;Available 24 hours/day ?Type of Home: House ?Home Access: Ramped entrance;Stairs to enter ?Entrance Stairs-Rails: None ?Entrance Stairs-Number of Steps: 3 ?Alternate Level Stairs-Number of Steps: 14 ?Home Layout: Full bath on main level;Able to live on main level with bedroom/bathroom;Two level ?Home Equipment: Conservation officer, nature (2 wheels) ?   ?  ?Prior Function Prior Level of Function : Independent/Modified Independent ?  ?  ?  ?  ?  ?  ?Mobility Comments: IND ?ADLs Comments: IND ?  ? ? ?Hand Dominance  ? Dominant Hand: Right ? ?  ?Extremity/Trunk Assessment  ? Upper Extremity Assessment ?Upper Extremity Assessment: Overall WFL for tasks assessed ?  ? ?Lower Extremity Assessment ?Lower Extremity Assessment: RLE deficits/detail;LLE deficits/detail ?RLE Deficits / Details: MMT: ank df/pf 5/5 ?RLE Sensation: WNL ?LLE Deficits / Details: MMT: ank df/pf 5/5, able to perform quad set ?LLE Sensation: WNL ?  ? ?Cervical / Trunk Assessment ?Cervical / Trunk Assessment: Normal  ?Communication  ? Communication: No difficulties  ?Cognition Arousal/Alertness: Awake/alert ?Behavior During Therapy: [facility] for tasks assessed/performed ?Overall Cognitive Status: Within Functional Limits for tasks assessed ?  ?  ?  ?  ?  ?  ?  ?  ?  ?  ?  ?  ?  ?  ?  ?  ?  ?  ?  ? ?  ?General Comments General comments (skin integrity, edema, etc.): VSS, taken at start of session (see vital signs flowsheet) ? ?  ?Exercises Total Joint Exercises ?Ankle Circles/Pumps: AROM, Both, 20 reps ?Quad Sets: AROM, Left, 5 reps  ? ?Assessment/Plan  ?  ?PT Assessment Patient needs continued PT services  ?PT Problem List Decreased strength;Decreased range of motion;Decreased  activity tolerance;Decreased balance;Decreased mobility;Decreased coordination;Decreased knowledge of use of DME;Pain ? ?   ?  ?PT Treatment Interventions DME instruction;Gait training;Stair training;Functional mobility training;Therapeutic activities;Therapeutic exercise;Balance training;Neuromuscular re-education;Patient/family education   ? ?PT Goals (Current goals can be found in the Care Plan section)  ?Acute Rehab PT Goals ?Patient Stated Goal: I want to get back to yardwork eventually ?PT Goal Formulation: With patient ?Time For Goal Achievement: 07/16/21 ?Potential to Achieve Goals: Good ? ?  ?Frequency 7X/week ?  ? ? ?Co-evaluation   ?  ?  ?  ?  ? ? ?  ?AM-PAC PT "6 Clicks" Mobility  ?Outcome Measure Help needed turning from your back to your side while in a flat bed without using bedrails?: A Little ?Help needed moving from lying on your back to sitting on the side of a flat bed without using bedrails?: A Little ?Help needed moving to and from a bed to a chair (including a wheelchair)?: A Little ?Help needed standing up from a chair using your arms (e.g., wheelchair or bedside chair)?: A Little ?Help needed to walk in hospital room?: A Little ?Help needed climbing 3-5 steps with a railing? : A Little ?6 Click Score: 18 ? ?  ?End of Session Equipment Utilized During Treatment: Gait belt ?Activity Tolerance: Patient tolerated treatment well ?Patient left: in bed;with bed alarm set;with  call bell/phone within reach;with family/visitor present ?Nurse Communication: Mobility status ?PT Visit Diagnosis: Difficulty in walking, not elsewhere classified (R26.2) ?  ? ?Time: 1740-1816 ?PT Time Calculation (min) (ACUTE ONLY): 36 min ? ? ?Charges:   PT Evaluation ?$PT Eval Low Complexity: 1 Low ?PT Treatments ?$Gait Training: 8-22 mins ?  ?   ?Coolidge Breeze, PT, DPT ?[facility] ?Office: [phone] ?Pager: [phone] ? ? ?Coolidge Breeze ?07/09/2021, 7:10 PM ? ?

## 2021-07-09 NOTE — Plan of Care (Signed)
  Problem: Education: Goal: Knowledge of General Education information will improve Description: Including pain rating scale, medication(s)/side effects and non-pharmacologic comfort measures Outcome: Progressing   Problem: Activity: Goal: Risk for activity intolerance will decrease Outcome: Progressing   Problem: Nutrition: Goal: Adequate nutrition will be maintained Outcome: Progressing   Problem: Coping: Goal: Level of anxiety will decrease Outcome: Progressing   Problem: Elimination: Goal: Will not experience complications related to urinary retention Outcome: Progressing   

## 2021-07-10 ENCOUNTER — Encounter (HOSPITAL_COMMUNITY): Payer: Self-pay | Admitting: Orthopedic Surgery

## 2021-07-10 DIAGNOSIS — M1612 Unilateral primary osteoarthritis, left hip: Secondary | ICD-10-CM | POA: Diagnosis not present

## 2021-07-10 LAB — BASIC METABOLIC PANEL
Anion gap: 6 (ref 5–15)
BUN: 15 mg/dL (ref 8–23)
CO2: 26 mmol/L (ref 22–32)
Calcium: 8.6 mg/dL — ABNORMAL LOW (ref 8.9–10.3)
Chloride: 103 mmol/L (ref 98–111)
Creatinine, Ser: 0.82 mg/dL (ref 0.61–1.24)
GFR, Estimated: >60 mL/min (ref >60)
Glucose, Bld: 163 mg/dL — ABNORMAL HIGH (ref 70–99)
Potassium: 3.1 mmol/L — ABNORMAL LOW (ref 3.5–5.1)
Sodium: 135 mmol/L (ref 135–145)

## 2021-07-10 LAB — CBC
HCT: 34.5 % — ABNORMAL LOW (ref 39.0–52.0)
Hemoglobin: 12.2 g/dL — ABNORMAL LOW (ref 13.0–17.0)
MCH: 31.4 pg (ref 26.0–34.0)
MCHC: 35.4 g/dL (ref 30.0–36.0)
MCV: 88.7 fL (ref 80.0–100.0)
Platelets: 140 10*3/uL — ABNORMAL LOW (ref 150–400)
RBC: 3.89 MIL/uL — ABNORMAL LOW (ref 4.22–5.81)
RDW: 13 % (ref 11.5–15.5)
WBC: 11.3 10*3/uL — ABNORMAL HIGH (ref 4.0–10.5)
nRBC: 0 % (ref 0.0–0.2)

## 2021-07-10 MED ORDER — METHOCARBAMOL 500 MG PO TABS: 500.0000 mg | ORAL_TABLET | Freq: Four times a day (QID) | ORAL | 0 refills | Status: DC | PRN

## 2021-07-10 MED ORDER — RIVAROXABAN 10 MG PO TABS: 10.0000 mg | ORAL_TABLET | Freq: Every day | ORAL | 0 refills | Status: AC

## 2021-07-10 MED ORDER — HYDROCODONE-ACETAMINOPHEN 5-325 MG PO TABS: 1.0000 | ORAL_TABLET | Freq: Four times a day (QID) | ORAL | 0 refills | Status: DC | PRN

## 2021-07-10 MED ORDER — TRAMADOL HCL 50 MG PO TABS: 50.0000 mg | ORAL_TABLET | Freq: Four times a day (QID) | ORAL | 0 refills | Status: DC | PRN

## 2021-07-10 NOTE — Progress Notes (Signed)
Physical Therapy Treatment ?Patient Details ?Name: Jeffrey [name] ?MRN: [id number] ?DOB: 07-May-1945 ?Today's Date: 07/10/2021 ? ? ?History of Present Illness Pt is a 76yo male presenting s/p L-THA, anterior approach on 07/09/21. PMH: anxiety & depression, OA, BPH, CKD, HTN, GERD, cerebral aneurysm, R-THA 2012, bilateral TKR ? ?  ?PT Comments  ? ? Patient eager to DC, has met goals.   ?Recommendations for follow up therapy are one component of a multi-disciplinary discharge planning process, led by the attending physician.  Recommendations may be updated based on patient status, additional functional criteria and insurance authorization. ? ?Follow Up Recommendations ? Follow physician's recommendations for discharge plan and follow up therapies ?  ?  ?Assistance Recommended at Discharge Set up Supervision/Assistance  ?Patient can return home with the following A little help with walking and/or transfers;A little help with bathing/dressing/bathroom;Assistance with cooking/housework;Assist for transportation;Help with stairs or ramp for entrance ?  ?Equipment Recommendations ? None recommended by PT  ?  ?Recommendations for Other Services   ? ? ?  ?Precautions / Restrictions Precautions ?Precautions: Fall ?Restrictions ?LLE Weight Bearing: Weight bearing as tolerated  ?  ? ?Mobility ? Bed Mobility ?  ?Bed Mobility: Supine to Sit ?  ?  ?Supine to sit: Min guard, HOB elevated ?  ?  ?General bed mobility comments: Pt min assist for LE advancement off and on bed, VCs for sequencing. Pt reported lightheadedness at start of treatment, monitored throughout and no increase in symptoms. ?  ? ?Transfers ?Overall transfer level: Needs assistance ?Equipment used: Rolling walker (2 wheels) ?Transfers: Sit to/from Stand ?Sit to Stand: Min guard, From elevated surface ?  ?  ?  ?  ?  ?General transfer comment: Pt min guard for safety , no physical assist required. VCs for hand placement and sequencing. ?   ? ?Ambulation/Gait ?Ambulation/Gait assistance: Min guard ?Gait Distance (Feet): 100 Feet ?Assistive device: Rolling walker (2 wheels) ?Gait Pattern/deviations: Step-to pattern, Decreased step length - left, Decreased stance time - left ?  ?  ?  ?General Gait Details: ambulated Step to then reciprocal ? ? ?Stairs ?Stairs: Yes ?Stairs assistance: Min assist ?Stair Management: Two rails, Forwards ?Number of Stairs: 3 ?  ? ? ?Wheelchair Mobility ?  ? ?Modified Rankin (Stroke Patients Only) ?  ? ? ?  ?Balance Overall balance assessment: Modified Independent ?  ?  ?  ?  ?  ?  ?  ?  ?  ?  ?  ?  ?  ?  ?  ?  ?  ?  ?  ? ?  ?Cognition Arousal/Alertness: Awake/alert ?  ?  ?  ?  ?  ?  ?  ?  ?  ?  ?  ?  ?  ?  ?  ?  ?  ?  ?  ?  ?  ? ?  ?Exercises   ? ?  ?General Comments   ?  ?  ? ?Pertinent Vitals/Pain Pain Assessment ?Pain Assessment: 0-10 ?Pain Score: 5  ?Pain Location: L THA ?Pain Descriptors / Indicators: Operative site guarding, Discomfort ?Pain Intervention(s): Premedicated before session, Monitored during session  ? ? ?Home Living   ?  ?  ?  ?  ?  ?  ?  ?  ?  ?   ?  ?Prior Function    ?  ?  ?   ? ?PT Goals (current goals can now be found in the care plan section) Progress towards PT goals: Progressing toward goals ? ?  ?  Frequency ? ? ? 7X/week ? ? ? ?  ?PT Plan Current plan remains appropriate  ? ? ?Co-evaluation   ?  ?  ?  ?  ? ?  ?AM-PAC PT "6 Clicks" Mobility   ?Outcome Measure ? Help needed turning from your back to your side while in a flat bed without using bedrails?: A Little ?Help needed moving from lying on your back to sitting on the side of a flat bed without using bedrails?: A Little ?Help needed moving to and from a bed to a chair (including a wheelchair)?: A Little ?Help needed standing up from a chair using your arms (e.g., wheelchair or bedside chair)?: A Little ?Help needed to walk in hospital room?: A Little ?Help needed climbing 3-5 steps with a railing? : A Little ?6 Click Score: 18 ? ?  ?End of  Session Equipment Utilized During Treatment: Gait belt ?Activity Tolerance: Patient tolerated treatment well ?Patient left: in chair;with call bell/phone within reach;with family/visitor present ?Nurse Communication: Mobility status ?PT Visit Diagnosis: Difficulty in walking, not elsewhere classified (R26.2) ?  ? ? ?Time: 7142-3200 ?PT Time Calculation (min) (ACUTE ONLY): 33 min ? ?Charges:  $Gait Training: 23-37 mins          ?          ? ?Tresa Endo PT ?Acute Rehabilitation Services ?Pager [phone] ?Office [phone] ? ? ? ?Quinterious Walraven, Shella Maxim ?07/10/2021, 12:59 PM ? ?

## 2021-07-10 NOTE — Plan of Care (Signed)
?  Problem: Education: ?Goal: Knowledge of General Education information will improve ?Description: Including pain rating scale, medication(s)/side effects and non-pharmacologic comfort measures ?07/10/2021 0305 by Jerrol Banana, RN ?Outcome: Progressing ?07/09/2021 1944 by Jerrol Banana, RN ?Outcome: Progressing ?  ?Problem: Clinical Measurements: ?Goal: Diagnostic test results will improve ?Outcome: Progressing ?Goal: Respiratory complications will improve ?Outcome: Progressing ?  ?Problem: Activity: ?Goal: Risk for activity intolerance will decrease ?07/10/2021 0305 by Jerrol Banana, RN ?Outcome: Progressing ?07/09/2021 1944 by Jerrol Banana, RN ?Outcome: Progressing ?  ?Problem: Nutrition: ?Goal: Adequate nutrition will be maintained ?Outcome: Progressing ?  ?Problem: Coping: ?Goal: Level of anxiety will decrease ?07/10/2021 0305 by Jerrol Banana, RN ?Outcome: Progressing ?07/09/2021 1944 by Jerrol Banana, RN ?Outcome: Progressing ?  ?Problem: Elimination: ?Goal: Will not experience complications related to urinary retention ?07/10/2021 0305 by Jerrol Banana, RN ?Outcome: Progressing ?07/09/2021 1944 by Jerrol Banana, RN ?Outcome: Progressing ?  ?Problem: Pain Managment: ?Goal: General experience of comfort will improve ?Outcome: Progressing ?  ?

## 2021-07-10 NOTE — Discharge Summary (Signed)
Patient ID: ?Jeffrey [name] ?MRN: [id number] ?DOB/AGE: [birth year] 76 y.o. ? ?Admit date: 07/09/2021 ?Discharge date: 07/10/2021 ? ?Admission Diagnoses:  ?Principal Problem: ?  OA (osteoarthritis) of hip ?Active Problems: ?  Osteoarthritis of left hip ? ? ?Discharge Diagnoses:  ?Same ? ?Past Medical History:  ?Diagnosis Date  ? Anal fissure   ? Anemia   ? Anxiety   ? Arthritis   ? Barrett esophagus   ? BPH (benign prostatic hyperplasia)   ? Bruises easily   ? Cancer [facility])   ? skin cancer removed with MOHS  ? 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 84m saccular left ophthalmic artery. followed by neurology. 2 year follow up from now.  ? Chronic kidney disease   ? DDD (degenerative disc disease), lumbar   ? Depression   ? ED (erectile dysfunction)   ? Esophageal candidiasis ([place] 09/2019  ? treated with fluconazole for 2 weeks.   ? GERD (gastroesophageal reflux disease)   ? Headache 10/2019  ? silent migraine causes vision problems  ? History of hiatal hernia   ? Hyperlipidemia   ? Hypertension   ? Lumbar radiculitis   ? Multiple thyroid nodules   ? Sleep apnea   ? does not use a cpap machine.   ? Tremors of nervous system   ? Vitiligo   ? ? ?Surgeries: Procedure(s): ?TOTAL HIP ARTHROPLASTY ANTERIOR APPROACH on 07/09/2021 ?  ?Consultants:  ? ?Discharged Condition: Improved ? ?Hospital Course: [name] an 76y.o. male who was admitted 07/09/2021 for operative treatment ofOA (osteoarthritis) of hip. Patient has severe unremitting pain that affects sleep, daily activities, and work/hobbies. After pre-op clearance the patient was taken to the operating room on 07/09/2021 and underwent  Procedure(s): ?TOTAL HIP ARTHROPLASTY ANTERIOR APPROACH.   ? ?Patient was given perioperative antibiotics:  ?Anti-infectives (From admission, onward)  ? ? Start     Dose/Rate Route Frequency Ordered Stop  ? 07/09/21 2200  vancomycin (VANCOCIN) IVPB 1000 mg/200 mL premix       ? 1,000 mg ?200 mL/hr over 60 Minutes Intravenous Every 12 hours  07/09/21 1551 07/10/21 0700  ? 07/09/21 0945  vancomycin (VANCOCIN) IVPB 1000 mg/200 mL premix  Status:  Discontinued       ? 1,000 mg ?200 mL/hr over 60 Minutes Intravenous On call to O.R. 07/09/21 [id number]/29/23 0940  ? 07/09/21 0945  vancomycin (VANCOREADY) IVPB 1500 mg/300 mL       ? 1,500 mg ?150 mL/hr over 120 Minutes Intravenous On call to O.R. 07/09/21 0937 07/10/21 0700  ? ?  ?  ? ?Patient was given sequential compression devices, early ambulation, and chemoprophylaxis to prevent DVT. ? ?Patient benefited maximally from hospital stay and there were no complications.   ? ?Recent vital signs: Patient Vitals for the past 24 hrs: ? BP Temp Temp src Pulse Resp SpO2 Height Weight  ?07/10/21 0916 121/81 97.6 ?F (36.4 ?C) Oral 80 16 96 % -- --  ?07/10/21 0601 123/81 (!) 97.5 ?F (36.4 ?C) -- 60 17 98 % -- --  ?07/10/21 0141 (!) 142/85 97.6 ?F (36.4 ?C) -- 84 17 97 % -- --  ?07/09/21 2051 125/86 97.7 ?F (36.5 ?C) -- 81 17 97 % -- --  ?07/09/21 1854 119/80 97.8 ?F (36.6 ?C) Oral 89 15 96 % -- --  ?07/09/21 1556 122/81 97.6 ?F (36.4 ?C) Oral 63 14 100 % '6\' 1"'$  (1.854 m) 118 kg  ?07/09/21 1530 -- -- -- 62 -- -- -- --  ?07/09/21  1515 116/85 -- -- 75 11 100 % -- --  ?07/09/21 1500 104/84 97.9 ?F (36.6 ?C) -- 63 17 100 % -- --  ?07/09/21 1445 114/82 -- -- 63 16 100 % -- --  ?07/09/21 1430 103/77 -- -- 63 14 99 % -- --  ?07/09/21 1419 97/75 -- -- 63 10 97 % -- --  ?07/09/21 1415 95/70 -- -- 68 (!) 21 98 % -- --  ?07/09/21 1405 -- -- -- 70 16 91 % -- --  ?07/09/21 1400 100/73 -- -- 67 11 94 % -- --  ?07/09/21 1356 99/71 97.7 ?F (36.5 ?C) -- 69 11 93 % -- --  ?  ? ?Recent laboratory studies:  ?Recent Labs  ?  07/10/21 ?0315  ?WBC 11.3*  ?HGB 12.2*  ?HCT 34.5*  ?PLT 140*  ?NA 135  ?K 3.1*  ?CL 103  ?CO2 26  ?BUN 15  ?CREATININE 0.82  ?GLUCOSE 163*  ?CALCIUM 8.6*  ? ? ? ?Discharge Medications:   ?Allergies as of 07/10/2021   ? ?   Reactions  ? Paroxetine Hcl Other (See Comments)  ? Anger, not a nice person  ? Mirtazapine Other  (See Comments)  ? unkn  ? Atorvastatin Other (See Comments)  ? Eye pain/ temple pain  ? Cefdinir Itching, Rash  ? Rash-gets blistery looking rash  ? Ciprofloxacin Rash  ? Doxazosin Anxiety  ? Fluoxetine Other (See Comments)  ? Sexual side effects  ? ?  ? ?  ?Medication List  ?  ? ?STOP taking these medications   ? ?b complex vitamins capsule ?  ?cholecalciferol 25 MCG (1000 UNIT) tablet ?Commonly known as: VITAMIN D3 ?  ?FISH OIL PO ?  ?naproxen 500 MG tablet ?Commonly known as: NAPROSYN ?  ?potassium chloride SA 20 MEQ tablet ?Commonly known as: KLOR-CON M ?  ?PRESERVISION AREDS 2+MULTI VIT PO ?  ?VITAMIN C PO ?  ?VITAMIN E PO ?  ? ?  ? ?TAKE these medications   ? ?amLODipine 10 MG tablet ?Commonly known as: NORVASC ?Take 10 mg by mouth daily. ?  ?chlorthalidone 25 MG tablet ?Commonly known as: HYGROTON ?Take 12.5 mg daily by mouth. ?  ?clobetasol cream 0.05 % ?Commonly known as: TEMOVATE ?Apply 1 application. topically daily as needed (Dry skin on fingers). ?  ?clotrimazole-betamethasone cream ?Commonly known as: LOTRISONE ?Apply 1 application. topically daily as needed (Rash). ?  ?HYDROcodone-acetaminophen 5-325 MG tablet ?Commonly known as: NORCO/VICODIN ?Take 1-2 tablets by mouth every 6 (six) hours as needed for moderate pain or severe pain. ?What changed:  ?how much to take ?when to take this ?reasons to take this ?  ?lisinopril 40 MG tablet ?Commonly known as: ZESTRIL ?Take 40 mg by mouth daily. ?  ?methocarbamol 500 MG tablet ?Commonly known as: ROBAXIN ?Take 1 tablet (500 mg total) by mouth every 6 (six) hours as needed for muscle spasms. ?  ?omeprazole 40 MG capsule ?Commonly known as: PRILOSEC ?Take 40 mg by mouth in the morning. ?  ?rivaroxaban 10 MG Tabs tablet ?Commonly known as: XARELTO ?Take 1 tablet (10 mg total) by mouth daily with breakfast for 20 days. Then take one 81 mg aspirin once a day for three weeks. Then discontinue aspirin. ?  ?tadalafil 5 MG tablet ?Commonly known as: CIALIS ?Take 5 mg  by mouth daily. ?  ?traMADol 50 MG tablet ?Commonly known as: ULTRAM ?Take 1-2 tablets (50-100 mg total) by mouth every 6 (six) hours as needed for moderate pain. ?  ? ?  ? ?  ?  ? ? ?  ?  Discharge Care Instructions  ?(From admission, onward)  ?  ? ? ?  ? ?  Start     Ordered  ? 07/10/21 0000  Weight bearing as tolerated       ? 07/10/21 0733  ? 07/10/21 0000  Change dressing       ?Comments: You have an adhesive waterproof bandage over the incision. Leave this in place until your first follow-up appointment. Once you remove this you will not need to place another bandage.  ? 07/10/21 0733  ? ?  ?  ? ?  ? ? ?Diagnostic Studies: DG Pelvis Portable ? ?Result Date: 07/09/2021 ?CLINICAL DATA:  Status post left hip arthroplasty EXAM: PORTABLE PELVIS 1-2 VIEWS COMPARISON:  None. FINDINGS: Status post left hip total arthroplasty with expected overlying postoperative change. No perihardware fracture or component malpositioning. Pre-existing right hip arthroplasty. Urolift implants project over the prostate. IMPRESSION: Status post left hip total arthroplasty with expected overlying postoperative change. No perihardware fracture or component malpositioning. Electronically Signed   By: Delanna Ahmadi M.D.   On: 07/09/2021 14:38  ? ?DG C-Arm 1-60 Min-No Report ? ?Result Date: 07/09/2021 ?CLINICAL DATA:  Left hip replacement EXAM: DG HIP (WITH OR WITHOUT PELVIS) 1V*L*; DG C-ARM 1-60 MIN-NO REPORT COMPARISON:  None. FINDINGS: Left hip replacement in satisfactory position alignment. No fracture complication Pre-existing right hip replacement IMPRESSION: Satisfactory left hip replacement. Electronically Signed   By: Franchot Gallo M.D.   On: 07/09/2021 13:18  ? ?DG C-Arm 1-60 Min-No Report ? ?Result Date: 07/09/2021 ?CLINICAL DATA:  Left hip replacement EXAM: DG HIP (WITH OR WITHOUT PELVIS) 1V*L*; DG C-ARM 1-60 MIN-NO REPORT COMPARISON:  None. FINDINGS: Left hip replacement in satisfactory position alignment. No fracture complication  Pre-existing right hip replacement IMPRESSION: Satisfactory left hip replacement. Electronically Signed   By: Franchot Gallo M.D.   On: 07/09/2021 13:18  ? ?DG HIP UNILAT WITH PELVIS 1V LEFT ? ?Result Date: 3

## 2021-07-10 NOTE — Progress Notes (Addendum)
? ?Subjective: ?1 Day Post-Op Procedure(s) (LRB): ?TOTAL HIP ARTHROPLASTY ANTERIOR APPROACH (Left) ?Patient reports pain as mild.   ?Patient seen in rounds by Dr. Wynelle Link. ?Patient is well, and has had no acute complaints or problems. Denies chest pain or Sob. No issues overnight. Foley catheter removed this AM. ?We will continue therapy today.  ? ?Objective: ?Vital signs in last 24 hours: ?Temp:  [97.5 ?F (36.4 ?C)-97.9 ?F (36.6 ?C)] 97.5 ?F (36.4 ?C) (03/30 0601) ?Pulse Rate:  [60-89] 60 (03/30 0601) ?Resp:  [10-21] 17 (03/30 0601) ?BP: (95-153)/(70-117) 123/81 (03/30 0601) ?SpO2:  [91 %-100 %] 98 % (03/30 0601) ?Weight:  [881 kg] 118 kg (03/29 1556) ? ?Intake/Output from previous day: ? ?Intake/Output Summary (Last 24 hours) at 07/10/2021 0723 ?Last data filed at 07/10/2021 0601 ?Gross per 24 hour  ?Intake 5176.22 ml  ?Output 4400 ml  ?Net 776.22 ml  ?  ? ?Intake/Output this shift: ?No intake/output data recorded. ? ?Labs: ?Recent Labs  ?  07/10/21 ?0315  ?HGB 12.2*  ? ?Recent Labs  ?  07/10/21 ?0315  ?WBC 11.3*  ?RBC 3.89*  ?HCT 34.5*  ?PLT 140*  ? ?Recent Labs  ?  07/10/21 ?0315  ?NA 135  ?K 3.1*  ?CL 103  ?CO2 26  ?BUN 15  ?CREATININE 0.82  ?GLUCOSE 163*  ?CALCIUM 8.6*  ? ?No results for input(s): LABPT, INR in the last 72 hours. ? ?Exam: ?General - Patient is Alert and Oriented ?Extremity - Neurologically intact ?Neurovascular intact ?Sensation intact distally ?Dorsiflexion/Plantar flexion intact ?Dressing - dressing C/D/I ?Motor Function - intact, moving foot and toes well on exam.  ? ?Past Medical History:  ?Diagnosis Date  ? Anal fissure   ? Anemia   ? Anxiety   ? Arthritis   ? Barrett esophagus   ? BPH (benign prostatic hyperplasia)   ? Bruises easily   ? Cancer [facility])   ? skin cancer removed with MOHS  ? 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 23m saccular left ophthalmic artery. followed by neurology. 2 year follow up from now.  ? Chronic kidney disease   ? DDD (degenerative disc disease), lumbar   ? Depression   ?  ED (erectile dysfunction)   ? [place]  ? treated with fluconazole for 2 weeks.   ? GERD (gastroesophageal reflux disease)   ? Headache 10/2019  ? silent migraine causes vision problems  ? History of hiatal hernia   ? Hyperlipidemia   ? Hypertension   ? Lumbar radiculitis   ? Multiple thyroid nodules   ? Sleep apnea   ? does not use a cpap machine.   ? Tremors of nervous system   ? Vitiligo   ? ? ?Assessment/Plan: ?1 Day Post-Op Procedure(s) (LRB): ?TOTAL HIP ARTHROPLASTY ANTERIOR APPROACH (Left) ?Principal Problem: ?  OA (osteoarthritis) of hip ?Active Problems: ?  Osteoarthritis of left hip ? ?Estimated body mass index is 34.32 kg/m? as calculated from the following: ?  Height as of this encounter: '6\' 1"'$  (1.854 m). ?  Weight as of this encounter: 118 kg. ?Advance diet ?Up with therapy ?D/C IV fluids ? ?DVT Prophylaxis - Xarelto ?Weight bearing as tolerated. ?Continue therapy. ? ?Potassium 3.1 this AM, on chronic 20 mEq Kcl BID. ? ?Plan is to go Home after hospital stay. ?Plan for discharge later today if progresses with therapy and meeting goals. ?HEP ?Follow-up in the office in 2 weeks ? ?The PDMP database was reviewed today prior to any opioid medications being prescribed to this  patient. ? ?Theresa Duty, PA-C ?Orthopedic Surgery ?[phone] ?07/10/2021, 7:23 AM ? ?

## 2021-07-10 NOTE — TOC Transition Note (Signed)
Transition of Care (TOC) - CM/SW Discharge Note ? ? ?Patient Details  ?Name: Jeffrey [name] ?MRN: [id number] ?Date of Birth: 1945-10-04 ? ?Transition of Care (TOC) CM/SW Contact:  ?Junie Avilla, LCSW ?Phone Number: ?07/10/2021, 10:13 AM ? ? ?Clinical Narrative:    ? ?Met with pt and spouse and confirming he has all needed DME at home and plan for HEP.  No TOC needs. ? ?Final next level of care: Home/Self Care ?Barriers to Discharge: No Barriers Identified ? ? ?Patient Goals and CMS Choice ?Patient states their goals for this hospitalization and ongoing recovery are:: return home ?  ?  ? ?Discharge Placement ?  ?           ?  ?  ?  ?  ? ?Discharge Plan and Services ?  ?  ?           ?DME Arranged: N/A ?DME Agency: NA ?  ?  ?  ?  ?  ?  ?  ?  ? ?Social Determinants of Health (SDOH) Interventions ?  ? ? ?Readmission Risk Interventions ?   ? View : No data to display.  ?  ?  ?  ? ? ? ? ? ?

## 2021-07-10 NOTE — Progress Notes (Deleted)
Report received from [place] RN/ED and pt was received to rm [phone] via stretcher and ambulatetd to bed without difficulty. Education initiated on use of call bell for needs/safety. ?

## 2021-07-10 NOTE — Plan of Care (Signed)
Pt ready to DC home with wife. 

## 2021-10-01 DIAGNOSIS — M7062 Trochanteric bursitis, left hip: Secondary | ICD-10-CM | POA: Insufficient documentation

## 2021-10-01 DIAGNOSIS — M48061 Spinal stenosis, lumbar region without neurogenic claudication: Secondary | ICD-10-CM | POA: Insufficient documentation

## 2021-10-01 DIAGNOSIS — M7061 Trochanteric bursitis, right hip: Secondary | ICD-10-CM | POA: Insufficient documentation

## 2021-10-21 ENCOUNTER — Encounter: Payer: Self-pay | Admitting: Ophthalmology

## 2021-10-22 ENCOUNTER — Ambulatory Visit: Payer: Medicare HMO | Admitting: Anesthesiology

## 2021-10-27 NOTE — Discharge Instructions (Signed)

## 2021-10-29 ENCOUNTER — Ambulatory Visit
Admission: RE | Admit: 2021-10-29 | Discharge: 2021-10-29 | Disposition: A | Discharge status: Home / Self Care | Payer: Medicare HMO | Attending: Ophthalmology | Admitting: Ophthalmology

## 2021-10-29 ENCOUNTER — Other Ambulatory Visit: Payer: Self-pay

## 2021-10-29 ENCOUNTER — Encounter
Admission: RE | Disposition: A | Discharge status: Home / Self Care | Payer: Self-pay | Source: Home / Self Care | Attending: Ophthalmology

## 2021-10-29 ENCOUNTER — Encounter: Payer: Self-pay | Admitting: Ophthalmology

## 2021-10-29 DIAGNOSIS — Z538 Procedure and treatment not carried out for other reasons: Secondary | ICD-10-CM | POA: Diagnosis not present

## 2021-10-29 DIAGNOSIS — I671 Cerebral aneurysm, nonruptured: Secondary | ICD-10-CM | POA: Insufficient documentation

## 2021-10-29 SURGERY — PHACOEMULSIFICATION, CATARACT, WITH IOL INSERTION
Anesthesia: Monitor Anesthesia Care

## 2021-10-29 MED ORDER — ARMC OPHTHALMIC DILATING DROPS
1.0000 | OPHTHALMIC | Status: DC | PRN
  Administered 2021-10-29 (×3): 1 via OPHTHALMIC

## 2021-10-29 MED ORDER — TETRACAINE HCL 0.5 % OP SOLN
1.0000 [drp] | OPHTHALMIC | Status: DC | PRN
  Administered 2021-10-29 (×2): 1 [drp] via OPHTHALMIC

## 2021-10-29 SURGICAL SUPPLY — 20 items
CANNULA ANT/CHMB 27G (MISCELLANEOUS) IMPLANT
CANNULA ANT/CHMB 27GA (MISCELLANEOUS) IMPLANT
CATARACT SUITE SIGHTPATH (MISCELLANEOUS) ×2 IMPLANT
FEE CATARACT SUITE SIGHTPATH (MISCELLANEOUS) ×1 IMPLANT
GLOVE SRG 8 PF TXTR STRL LF DI (GLOVE) ×2 IMPLANT
GLOVE SURG ENC TEXT LTX SZ7.5 (GLOVE) ×3 IMPLANT
GLOVE SURG GAMMEX PI TX LF 7.5 (GLOVE) IMPLANT
GLOVE SURG UNDER POLY LF SZ8 (GLOVE) ×2
NDL FILTER BLUNT 18X1 1/2 (NEEDLE) ×1 IMPLANT
NDL RETROBULBAR .5 NSTRL (NEEDLE) IMPLANT
NEEDLE FILTER BLUNT 18X 1/2SAF (NEEDLE) ×1
NEEDLE FILTER BLUNT 18X1 1/2 (NEEDLE) ×1 IMPLANT
PACK VIT ANT 23G (MISCELLANEOUS) IMPLANT
RING MALYGIN 7.0 (MISCELLANEOUS) IMPLANT
SUT ETHILON 10-0 CS-B-6CS-B-6 (SUTURE)
SUT VICRYL  9 0 (SUTURE)
SUT VICRYL 9 0 (SUTURE) IMPLANT
SUTURE EHLN 10-0 CS-B-6CS-B-6 (SUTURE) IMPLANT
SYR 3ML LL SCALE MARK (SYRINGE) ×3 IMPLANT
WATER STERILE IRR 250ML POUR (IV SOLUTION) ×3 IMPLANT

## 2021-10-29 NOTE — H&P (Signed)
  Cataract surgery canceled due to sudden vision change likely due to retinal disease.  Follow up 1 day in office.

## 2021-10-29 NOTE — Progress Notes (Signed)
Pt. Cancelled per Dr. Wallace Going. Pt. Scheduled for eye appointment.

## 2021-10-29 NOTE — Anesthesia Preprocedure Evaluation (Deleted)
Anesthesia Evaluation  Patient identified by MRN, date of birth, ID band Patient awake    Reviewed: Allergy & Precautions, NPO status , Patient's Chart, lab work & pertinent test results  Airway Mallampati: II  TM Distance: >3 FB Neck ROM: Full    Dental no notable dental hx.    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)  Normal cardiovascular exam     Neuro/Psych  Headaches, Anxiety Depression Essential tremor    GI/Hepatic GERD  Controlled,  Endo/Other  negative endocrine ROS  Renal/GU CRFRenal disease     Musculoskeletal  (+) Arthritis ,   Abdominal Normal abdominal exam  (+)   Peds  Hematology   Anesthesia Other Findings   Reproductive/Obstetrics                             Anesthesia Physical Anesthesia Plan  ASA: 3  Anesthesia Plan: MAC   Post-op Pain Management: Minimal or no pain anticipated   Induction: Intravenous  PONV Risk Score and Plan: 1 and Midazolam, TIVA and Treatment may vary due to age or medical condition  Airway Management Planned: Natural Airway and Nasal Cannula  Additional Equipment:   Intra-op Plan:   Post-operative Plan:   Informed Consent: I have reviewed the patients History and Physical, chart, labs and discussed the procedure including the risks, benefits and alternatives for the proposed anesthesia with the patient or authorized representative who has indicated his/her understanding and acceptance.     Dental advisory given  Plan Discussed with: CRNA  Anesthesia Plan Comments:         Anesthesia Quick Evaluation

## 2021-10-30 ENCOUNTER — Other Ambulatory Visit: Payer: Self-pay

## 2021-10-30 ENCOUNTER — Emergency Department
Admission: EM | Admit: 2021-10-30 | Discharge: 2021-10-30 | Disposition: A | Discharge status: Home / Self Care | Payer: Medicare HMO | Attending: Emergency Medicine | Admitting: Emergency Medicine

## 2021-10-30 ENCOUNTER — Emergency Department: Payer: Medicare HMO

## 2021-10-30 DIAGNOSIS — F419 Anxiety disorder, unspecified: Secondary | ICD-10-CM | POA: Insufficient documentation

## 2021-10-30 DIAGNOSIS — R0602 Shortness of breath: Secondary | ICD-10-CM | POA: Diagnosis not present

## 2021-10-30 DIAGNOSIS — F410 Panic disorder [episodic paroxysmal anxiety] without agoraphobia: Secondary | ICD-10-CM

## 2021-10-30 DIAGNOSIS — R42 Dizziness and giddiness: Secondary | ICD-10-CM | POA: Diagnosis not present

## 2021-10-30 DIAGNOSIS — I10 Essential (primary) hypertension: Secondary | ICD-10-CM | POA: Insufficient documentation

## 2021-10-30 DIAGNOSIS — R0789 Other chest pain: Secondary | ICD-10-CM | POA: Insufficient documentation

## 2021-10-30 LAB — COMPREHENSIVE METABOLIC PANEL
ALT: 22 U/L (ref 0–44)
AST: 24 U/L (ref 15–41)
Albumin: 3.9 g/dL (ref 3.5–5.0)
Alkaline Phosphatase: 63 U/L (ref 38–126)
Anion gap: 11 (ref 5–15)
BUN: 17 mg/dL (ref 8–23)
CO2: 28 mmol/L (ref 22–32)
Calcium: 9.5 mg/dL (ref 8.9–10.3)
Chloride: 102 mmol/L (ref 98–111)
Creatinine, Ser: 1.15 mg/dL (ref 0.61–1.24)
GFR, Estimated: >60 mL/min (ref >60)
Glucose, Bld: 152 mg/dL — ABNORMAL HIGH (ref 70–99)
Potassium: 3.5 mmol/L (ref 3.5–5.1)
Sodium: 141 mmol/L (ref 135–145)
Total Bilirubin: 1.1 mg/dL (ref 0.3–1.2)
Total Protein: 6.7 g/dL (ref 6.5–8.1)

## 2021-10-30 LAB — CBC WITH DIFFERENTIAL/PLATELET
Abs Immature Granulocytes: 0.02 10*3/uL (ref 0.00–0.07)
Basophils Absolute: 0 10*3/uL (ref 0.0–0.1)
Basophils Relative: 1 %
Eosinophils Absolute: 0.1 10*3/uL (ref 0.0–0.5)
Eosinophils Relative: 2 %
HCT: 44.1 % (ref 39.0–52.0)
Hemoglobin: 14.2 g/dL (ref 13.0–17.0)
Immature Granulocytes: 0 %
Lymphocytes Relative: 19 %
Lymphs Abs: 1.1 10*3/uL (ref 0.7–4.0)
MCH: 27.3 pg (ref 26.0–34.0)
MCHC: 32.2 g/dL (ref 30.0–36.0)
MCV: 84.8 fL (ref 80.0–100.0)
Monocytes Absolute: 1 10*3/uL (ref 0.1–1.0)
Monocytes Relative: 16 %
Neutro Abs: 3.7 10*3/uL (ref 1.7–7.7)
Neutrophils Relative %: 62 %
Platelets: 163 10*3/uL (ref 150–400)
RBC: 5.2 MIL/uL (ref 4.22–5.81)
RDW: 13.7 % (ref 11.5–15.5)
WBC: 5.9 10*3/uL (ref 4.0–10.5)
nRBC: 0 % (ref 0.0–0.2)

## 2021-10-30 LAB — TROPONIN I (HIGH SENSITIVITY)
Troponin I (High Sensitivity): 9 ng/L (ref <18)
Troponin I (High Sensitivity): 8 ng/L (ref <18)

## 2021-10-30 NOTE — ED Provider Triage Note (Signed)
Emergency Medicine Provider Triage Evaluation Note  Jeffrey [name] , a 76 y.o. male with a history of macular degeneration, cerebral aneurysm, hypertension and hyperlipidemia was evaluated in triage.  Pt complains of chest tightness and chest pain.  Patient reports that he developed some changes in vision primarily of his right eye.  Patient states that he has had an established diagnosis of macular degeneration but has noticed some increased "waviness" of his vision and became anxious.  Patient states that he does struggle with anxiety in social situations.  He was being evaluated at his ophthalmologist office for possible cataract removal when he developed chest pain and they referred him to the emergency department for further care and management.  Review of Systems  Positive: Patient has metamorphopsia, chest tightness and chest pain Negative: No nausea, vomiting or abdominal pain.  Physical Exam  There were no vitals taken for this visit. Gen:   Awake, no distress   Resp:  Normal effort  MSK:   Moves extremities without difficulty  Other:    Medical Decision Making  Medically screening exam initiated at 11:19 AM.  Appropriate orders placed.  Jeffrey [name] was informed that the remainder of the evaluation will be completed by another provider, this initial triage assessment does not replace that evaluation, and the importance of remaining in the ED until their evaluation is complete.     Vallarie Mare [place], [place] 10/30/21 1121

## 2021-10-30 NOTE — ED Triage Notes (Signed)
Pt states he was at the eye dr for changes in his vision- pt states he started to feel chest tightness, SHOB, and dizziness while there and he was told to come here- they checked his BP and it was 150/100- pt denies cp

## 2021-10-30 NOTE — ED Provider Notes (Signed)
[facility] Provider Note    Event Date/Time   First MD Initiated Contact with Patient 10/30/21 1256     (approximate)   History   Chief Complaint Chest Pain   HPI  Jeffrey [name] is a 76 y.o. male with past medical history of hypertension, hyperlipidemia, GERD, anxiety, and anemia who presents to the ED complaining of chest pain.  Patient reports that he was at his ophthalmologist office today when he began to have tightness in his chest with dizziness and lightheadedness.  He states he felt slightly short of breath with the symptoms and almost like he was going to pass out.  Staff at the ophthalmologist recommended he come to the ED for further evaluation.  Patient now states he is feeling much better with resolution of his symptoms, describes symptoms as similar to prior panic attacks.  He has been feeling well recently with no fevers or cough, denies any pain or swelling in his legs.     Physical Exam   Triage Vital Signs: ED Triage Vitals  Enc Vitals Group     BP 10/30/21 1118 (!) 126/104     Pulse Rate 10/30/21 1118 78     Resp 10/30/21 1118 18     Temp 10/30/21 1118 98.6 F (37 C)     Temp Source 10/30/21 1118 Oral     SpO2 10/30/21 1118 96 %     Weight 10/30/21 1119 262 lb (118.8 kg)     Height 10/30/21 1119 '6\' 1"'$  (1.854 m)     Head Circumference --      Peak Flow --      Pain Score 10/30/21 1119 0     Pain Loc --      Pain Edu? --      Excl. in [place]? --     Most recent vital signs: Vitals:   10/30/21 1118  BP: (!) 126/104  Pulse: 78  Resp: 18  Temp: 98.6 F (37 C)  SpO2: 96%    Constitutional: Alert and oriented. Eyes: Conjunctivae are normal. Head: Atraumatic. Nose: No congestion/rhinnorhea. Mouth/Throat: Mucous membranes are moist.  Cardiovascular: Normal rate, regular rhythm. Grossly normal heart sounds.  2+ radial pulses bilaterally. Respiratory: Normal respiratory effort.  No retractions. Lungs CTAB. Gastrointestinal:  Soft and nontender. No distention. Musculoskeletal: No lower extremity tenderness nor edema.  Neurologic:  Normal speech and language. No gross focal neurologic deficits are appreciated.    ED Results / Procedures / Treatments   Labs (all labs ordered are listed, but only abnormal results are displayed) Labs Reviewed  COMPREHENSIVE METABOLIC PANEL - Abnormal; Notable for the following components:      Result Value   Glucose, Bld 152 (*)    All other components within normal limits  CBC WITH DIFFERENTIAL/PLATELET  TROPONIN I (HIGH SENSITIVITY)  TROPONIN I (HIGH SENSITIVITY)     EKG  ED ECG REPORT I, Blake Divine, the attending physician, personally viewed and interpreted this ECG.   Date: 10/30/2021  EKG Time: 11:26  Rate: 74  Rhythm: normal sinus rhythm  Axis: Normal  Intervals:none  ST&T Change: None  RADIOLOGY Chest x-ray reviewed and interpreted by me with no infiltrate, edema, or effusion.  PROCEDURES:  Critical Care performed: No  Procedures   MEDICATIONS ORDERED IN ED: Medications - No data to display   IMPRESSION / MDM / [place] / ED COURSE  I reviewed the triage vital signs and the nursing notes.  76 y.o. male with past medical history of hypertension, hyperlipidemia, GERD, anemia, and anxiety who presents to the ED complaining of episode of chest tightness, lightheadedness, and difficulty catching his breath earlier today at his eye doctor's office.  Patient's presentation is most consistent with acute presentation with potential threat to life or bodily function.  Differential diagnosis includes, but is not limited to, ACS, PE, pneumonia, pneumothorax, dissection, GERD, musculoskeletal pain, anxiety.  Patient well-appearing and in no acute distress, vital signs are unremarkable and he now states his symptoms have resolved.  EKG shows no evidence of arrhythmia or ischemia and initial troponin is negative.   Low suspicion for ACS as symptoms are more consistent with an anxiety attack.  Chest x-ray is unremarkable and he is breathing comfortably on room air.  Remainder of labs are reassuring with no significant anemia, leukocytosis, electrolyte abnormality, or AKI.  We will check second set troponin, but if this is within normal limits patient would be appropriate for discharge home with PCP follow-up.  Repeat troponin within normal limits and patient remains asymptomatic here in the ED.  He is appropriate for discharge home with PCP follow-up, was counseled to return to the ED for new or worsening symptoms.  Patient agrees with plan.      FINAL CLINICAL IMPRESSION(S) / ED DIAGNOSES   Final diagnoses:  Atypical chest pain  Dizziness  Anxiety attack     Rx / DC Orders   ED Discharge Orders     None        Note:  This document was prepared using Dragon voice recognition software and may include unintentional dictation errors.   Blake Divine, MD 10/30/21 1435

## 2021-11-13 ENCOUNTER — Encounter: Payer: Self-pay | Admitting: Ophthalmology

## 2021-11-18 DIAGNOSIS — M545 Low back pain, unspecified: Secondary | ICD-10-CM | POA: Insufficient documentation

## 2021-11-19 NOTE — Discharge Instructions (Signed)

## 2021-11-19 NOTE — Anesthesia Preprocedure Evaluation (Addendum)
Anesthesia Evaluation  Patient identified by MRN, date of birth, ID band Patient awake    Reviewed: Allergy & Precautions, NPO status , Patient's Chart, lab work & pertinent test results, reviewed documented beta blocker date and time   Airway Mallampati: II  TM Distance: >3 FB Neck ROM: Full    Dental  (+) Poor Dentition, Dental Advisory Given   Pulmonary sleep apnea ,    Pulmonary exam normal breath sounds clear to auscultation       Cardiovascular hypertension, Pt. on medications  Rhythm:Regular Rate:Bradycardia  33m saccular aneurysm left ophthalmic artery   Neuro/Psych  Headaches, PSYCHIATRIC DISORDERS Anxiety Depression Lumbar radiculitis Essential tremors  Neuromuscular disease    GI/Hepatic Neg liver ROS, hiatal hernia, GERD  Medicated and Controlled,Hx/o Barrett's esophagus   Endo/Other  Multiple thyroid nodules Obesity  Renal/GU negative Renal ROS   BPH    Musculoskeletal  (+) Arthritis , Osteoarthritis,  OA left hip   Abdominal (+) + obese,   Peds  Hematology  (+) Blood dyscrasia, anemia ,   Anesthesia Other Findings   Reproductive/Obstetrics ED                            Anesthesia Physical  Anesthesia Plan  ASA: 3  Anesthesia Plan: MAC   Post-op Pain Management: Minimal or no pain anticipated   Induction:   PONV Risk Score and Plan: Treatment may vary due to age or medical condition  Airway Management Planned: Nasal Cannula and Natural Airway  Additional Equipment: None  Intra-op Plan:   Post-operative Plan:   Informed Consent: I have reviewed the patients History and Physical, chart, labs and discussed the procedure including the risks, benefits and alternatives for the proposed anesthesia with the patient or authorized representative who has indicated his/her understanding and acceptance.     Dental advisory given  Plan Discussed with: CRNA and  Anesthesiologist  Anesthesia Plan Comments:        Anesthesia Quick Evaluation

## 2021-11-24 ENCOUNTER — Ambulatory Visit
Admission: RE | Admit: 2021-11-24 | Discharge: 2021-11-24 | Disposition: A | Discharge status: Home / Self Care | Payer: Medicare HMO | Attending: Ophthalmology | Admitting: Ophthalmology

## 2021-11-24 ENCOUNTER — Encounter: Payer: Self-pay | Admitting: Ophthalmology

## 2021-11-24 ENCOUNTER — Ambulatory Visit: Payer: Medicare HMO | Admitting: Anesthesiology

## 2021-11-24 ENCOUNTER — Other Ambulatory Visit: Payer: Self-pay

## 2021-11-24 ENCOUNTER — Ambulatory Visit (AMBULATORY_SURGERY_CENTER): Payer: Medicare HMO | Admitting: Anesthesiology

## 2021-11-24 ENCOUNTER — Encounter
Admission: RE | Disposition: A | Discharge status: Home / Self Care | Payer: Self-pay | Source: Home / Self Care | Attending: Ophthalmology

## 2021-11-24 DIAGNOSIS — H2512 Age-related nuclear cataract, left eye: Secondary | ICD-10-CM | POA: Diagnosis present

## 2021-11-24 DIAGNOSIS — F418 Other specified anxiety disorders: Secondary | ICD-10-CM | POA: Diagnosis not present

## 2021-11-24 DIAGNOSIS — M1990 Unspecified osteoarthritis, unspecified site: Secondary | ICD-10-CM | POA: Insufficient documentation

## 2021-11-24 DIAGNOSIS — I10 Essential (primary) hypertension: Secondary | ICD-10-CM | POA: Diagnosis not present

## 2021-11-24 DIAGNOSIS — N189 Chronic kidney disease, unspecified: Secondary | ICD-10-CM | POA: Diagnosis not present

## 2021-11-24 DIAGNOSIS — Z6832 Body mass index (BMI) 32.0-32.9, adult: Secondary | ICD-10-CM | POA: Diagnosis not present

## 2021-11-24 DIAGNOSIS — I129 Hypertensive chronic kidney disease with stage 1 through stage 4 chronic kidney disease, or unspecified chronic kidney disease: Secondary | ICD-10-CM | POA: Diagnosis not present

## 2021-11-24 DIAGNOSIS — K219 Gastro-esophageal reflux disease without esophagitis: Secondary | ICD-10-CM | POA: Insufficient documentation

## 2021-11-24 DIAGNOSIS — K449 Diaphragmatic hernia without obstruction or gangrene: Secondary | ICD-10-CM

## 2021-11-24 DIAGNOSIS — E669 Obesity, unspecified: Secondary | ICD-10-CM | POA: Diagnosis not present

## 2021-11-24 HISTORY — PX: CATARACT EXTRACTION W/PHACO: SHX586

## 2021-11-24 SURGERY — PHACOEMULSIFICATION, CATARACT, WITH IOL INSERTION
Anesthesia: Monitor Anesthesia Care | Site: Eye | Laterality: Left

## 2021-11-24 MED ORDER — BRIMONIDINE TARTRATE-TIMOLOL 0.2-0.5 % OP SOLN
OPHTHALMIC | Status: DC | PRN
  Administered 2021-11-24: 1 [drp] via OPHTHALMIC

## 2021-11-24 MED ORDER — FENTANYL CITRATE (PF) 100 MCG/2ML IJ SOLN
INTRAMUSCULAR | Status: DC | PRN
Start: 2021-11-24 — End: 2021-11-24
  Administered 2021-11-24: 50 ug via INTRAVENOUS

## 2021-11-24 MED ORDER — MIDAZOLAM HCL 2 MG/2ML IJ SOLN
INTRAMUSCULAR | Status: DC | PRN
  Administered 2021-11-24: 2 mg via INTRAVENOUS

## 2021-11-24 MED ORDER — TETRACAINE HCL 0.5 % OP SOLN
1.0000 [drp] | OPHTHALMIC | Status: AC | PRN
  Administered 2021-11-24 (×3): 1 [drp] via OPHTHALMIC

## 2021-11-24 MED ORDER — MOXIFLOXACIN HCL 0.5 % OP SOLN
OPHTHALMIC | Status: DC | PRN
  Administered 2021-11-24: 0.2 mL via OPHTHALMIC

## 2021-11-24 MED ORDER — SIGHTPATH DOSE#1 BSS IO SOLN
INTRAOCULAR | Status: DC | PRN
  Administered 2021-11-24: 67 mL via OPHTHALMIC

## 2021-11-24 MED ORDER — ARMC OPHTHALMIC DILATING DROPS
1.0000 | OPHTHALMIC | Status: AC | PRN
  Administered 2021-11-24 (×3): 1 via OPHTHALMIC

## 2021-11-24 MED ORDER — SIGHTPATH DOSE#1 NA HYALUR & NA CHOND-NA HYALUR IO KIT
PACK | INTRAOCULAR | Status: DC | PRN
  Administered 2021-11-24: 1 via OPHTHALMIC

## 2021-11-24 MED ORDER — SIGHTPATH DOSE#1 BSS IO SOLN
INTRAOCULAR | Status: DC | PRN
  Administered 2021-11-24: 1 mL via INTRAMUSCULAR

## 2021-11-24 MED ORDER — SIGHTPATH DOSE#1 BSS IO SOLN
INTRAOCULAR | Status: DC | PRN
  Administered 2021-11-24: 15 mL

## 2021-11-24 SURGICAL SUPPLY — 12 items
CATARACT SUITE SIGHTPATH (MISCELLANEOUS) ×2 IMPLANT
FEE CATARACT SUITE SIGHTPATH (MISCELLANEOUS) ×1 IMPLANT
GLOVE SRG 8 PF TXTR STRL LF DI (GLOVE) ×1 IMPLANT
GLOVE SURG ENC TEXT LTX SZ7.5 (GLOVE) ×2 IMPLANT
GLOVE SURG UNDER POLY LF SZ8 (GLOVE) ×2
LENS IOL EYEHANCE 18.5 ×2 IMPLANT
LENS IOL EYHANCE TRC 375 18.5 IMPLANT
NDL FILTER BLUNT 18X1 1/2 (NEEDLE) ×1 IMPLANT
NEEDLE FILTER BLUNT 18X 1/2SAF (NEEDLE) ×1
NEEDLE FILTER BLUNT 18X1 1/2 (NEEDLE) ×1 IMPLANT
SYR 3ML LL SCALE MARK (SYRINGE) ×2 IMPLANT
WATER STERILE IRR 250ML POUR (IV SOLUTION) ×2 IMPLANT

## 2021-11-24 NOTE — Transfer of Care (Signed)
Immediate Anesthesia Transfer of Care Note  Patient: Jeffrey [name]  Procedure(s) Performed: CATARACT EXTRACTION PHACO AND INTRAOCULAR LENS PLACEMENT (IOC) LEFT TORIC LENS 7.32 01:01.2 (Left: Eye)  Patient Location: PACU  Anesthesia Type: MAC  Level of Consciousness: awake, alert  and patient cooperative  Airway and Oxygen Therapy: Patient Spontanous Breathing and Patient connected to supplemental oxygen  Post-op Assessment: Post-op Vital signs reviewed, Patient's Cardiovascular Status Stable, Respiratory Function Stable, Patent Airway and No signs of Nausea or vomiting  Post-op Vital Signs: Reviewed and stable  Complications: No notable events documented.

## 2021-11-24 NOTE — H&P (Signed)
[facility]   Primary Care Physician:  [name], MD Ophthalmologist: Dr. Leandrew Koyanagi  Pre-Procedure History & Physical: HPI:  Jeffrey [name] is a 76 y.o. male here for ophthalmic surgery.   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 followed by neurology. 2 year follow up from now.   Chronic kidney disease    DDD (degenerative disc disease), lumbar    Depression    ED (erectile dysfunction)    [place]ophageal [place]iasis ([place] 09[place]   treated with fluconazole for 2 weeks.    GERD (gastroesophageal reflux disease)    Headache 10/2019   silent migraine causes vision problems   History of hiatal hernia    Hyperlipidemia    Hypertension    Lumbar radiculitis    Multiple thyroid nodules    Sleep apnea    does not use a cpap machine.    Tremors of nervous system    Vitiligo     Past Surgical History:  Procedure Laterality Date   APPENDECTOMY     cheek surgery Right    stainless steel in right cheek   COLONOSCOPY     COLONOSCOPY WITH PROPOFOL N/A 02/19/2017   Procedure: COLONOSCOPY WITH PROPOFOL;  Surgeon: Toledo, TBenay Pike MD;  Location: [facility];  Service: Gastroenterology;  Laterality: N/A;   ESOPHAGOGASTRODUODENOSCOPY     ESOPHAGOGASTRODUODENOSCOPY (EGD) WITH PROPOFOL N/A 02/19/2017   Procedure: ESOPHAGOGASTRODUODENOSCOPY (EGD) WITH PROPOFOL;  Surgeon: Toledo, TBenay Pike MD;  Location: [facility];  Service: Gastroenterology;  Laterality: N/A;   ESOPHAGOGASTRODUODENOSCOPY (EGD) WITH PROPOFOL N/A 09/27/2019   Procedure: ESOPHAGOGASTRODUODENOSCOPY (EGD) WITH PROPOFOL;  Surgeon: Toledo, TBenay Pike MD;  Location: [facility];  Service: Gastroenterology;  Laterality: N/A;   FUNCTIONAL ENDOSCOPIC SINUS  SURGERY     uvuloplasty done for OSA   HERNIA REPAIR     hiatal   JOINT REPLACEMENT Right 2012   THR   JOINT REPLACEMENT Bilateral    TKR   TOTAL HIP ARTHROPLASTY Left 07/09/2021   Procedure: TOTAL HIP ARTHROPLASTY ANTERIOR APPROACH;  Surgeon: AGaynelle Arabian MD;  Location: [facility];  Service: Orthopedics;  Laterality: Left;    Prior to Admission medications   Medication Sig Start Date End Date Taking? Authorizing Provider  amLODipine (NORVASC) 10 MG tablet Take 10 mg by mouth daily.  10/27/13  Yes [provider]  Ascorbic Acid (VITAMIN C PO) Take by mouth daily.   Yes [provider]  celecoxib (CELEBREX) 200 MG capsule Take 200 mg by mouth 2 (two) times daily.   Yes [provider]  chlorthalidone (HYGROTON) 25 MG tablet Take 12.5 mg daily by mouth.   Yes [provider]  clobetasol cream (TEMOVATE) 08.10% Apply 1 application. topically daily as needed (Dry skin on fingers).   Yes [provider]  HYDROcodone-acetaminophen (NORCO/VICODIN) 5-325 MG tablet Take 1-2 tablets by mouth every 6 (six) hours as needed for moderate pain or severe pain. 07/10/21  Yes Edmisten, Kristie L, PA  lisinopril (PRINIVIL,ZESTRIL) 40 MG tablet Take 40 mg by mouth daily. 10/23/13  Yes [provider]  Omega-3 Fatty Acids (FISH OIL PO) Take by mouth daily.   Yes [provider]  potassium chloride (KLOR-CON) 20 MEQ packet Take 20 mEq by mouth 2 (two) times daily.   Yes [provider]  b complex vitamins capsule Take 1 capsule by mouth daily. Patient not taking: Reported on 11/13/2021    [provider]  clotrimazole-betamethasone (LOTRISONE) cream Apply 1 application. topically daily as needed (Rash). Patient not taking: Reported on 11/13/2021    [provider]  methocarbamol (ROBAXIN) 500 MG tablet Take 1 tablet (500 mg total) by mouth every 6 (six) hours as needed for muscle spasms. Patient not taking: Reported on 10/21/2021  07/10/21   Edmisten, Drue Dun L, PA  tadalafil (CIALIS) 5 MG tablet Take 5 mg by mouth daily. 04/22/21   [provider]  traMADol (ULTRAM) 50 MG tablet Take 1-2 tablets (50-100 mg total) by mouth every 6 (six) hours as needed for moderate pain. 07/10/21   Edmisten, Kristie L, PA    Allergies as of 11/11/2021 - Review Complete 10/30/2021  Allergen Reaction Noted   Paroxetine hcl Other (See Comments) 08/19/2015   Mirtazapine Other (See Comments) 08/19/2015   Atorvastatin Other (See Comments) 08/19/2015   Cefdinir Itching and Rash 12/06/2013   Ciprofloxacin Rash 10/18/2017   Doxazosin Anxiety 08/19/2015   Fluoxetine Other (See Comments) 08/19/2015    History reviewed. No pertinent family history.  Social History   Socioeconomic History   Marital status: Married    Spouse name: Wells Guiles   Number of children: 2   Years of education: college   Highest education level: Not on file  Occupational History   Occupation: Press photographer at a [employer]    Employer: RETIRED  Tobacco Use   Smoking status: Never   Smokeless tobacco: Never  Vaping Use   Vaping Use: Never used  Substance and Sexual Activity   Alcohol use: Not Currently    Comment: OCC.   Drug use: No   Sexual activity: Not Currently  Other Topics Concern   Not on file  Social History Narrative   Patient lives with just wife.   Social Determinants of Health   Financial Resource Strain: Not on file  Food Insecurity: Not on file  Transportation Needs: Not on file  Physical Activity: Not on file  Stress: Not on file  Social Connections: Not on file  Intimate Partner Violence: Not on file    Review of Systems: See HPI, otherwise negative ROS  Physical Exam: BP (!) 129/94   Pulse 72   Temp 98.1 F (36.7 C) (Temporal)   Resp 18   Ht '6\' 1"'$  (1.854 m)   Wt 112.5 kg   SpO2 95%   BMI 32.72 kg/m  General:   Alert,  pleasant and cooperative in NAD Head:  Normocephalic and atraumatic. Lungs:  Clear to  auscultation.    Heart:  Regular rate and rhythm.   Impression/Plan: Jeffrey [name] is here for ophthalmic surgery.  Risks, benefits, limitations, and alternatives regarding ophthalmic surgery have been reviewed with the patient.  Questions have been answered.  All parties agreeable.   Leandrew Koyanagi, MD  11/24/2021, 8:59 AM

## 2021-11-24 NOTE — Anesthesia Postprocedure Evaluation (Signed)
Anesthesia Post Note  Patient: Jeffrey [name]  Procedure(s) Performed: CATARACT EXTRACTION PHACO AND INTRAOCULAR LENS PLACEMENT (IOC) LEFT TORIC LENS 7.32 01:01.2 (Left: Eye)     Patient location during evaluation: PACU Anesthesia Type: MAC Level of consciousness: awake and alert Pain management: pain level controlled Vital Signs Assessment: post-procedure vital signs reviewed and stable Respiratory status: spontaneous breathing, nonlabored ventilation and respiratory function stable Cardiovascular status: blood pressure returned to baseline and stable Postop Assessment: no apparent nausea or vomiting Anesthetic complications: no   No notable events documented.  [place] Ouch

## 2021-11-24 NOTE — Op Note (Signed)
LOCATION:  [place]   PREOPERATIVE DIAGNOSIS:  Nuclear sclerotic cataract of the left eye.  H25.12  POSTOPERATIVE DIAGNOSIS:  Nuclear sclerotic cataract of the left eye.   PROCEDURE:  Phacoemulsification with Toric posterior chamber intraocular lens placement of the left eye.  Ultrasound time: Procedure(s): CATARACT EXTRACTION PHACO AND INTRAOCULAR LENS PLACEMENT (IOC) LEFT TORIC LENS 7.32 01:01.2 (Left)  LENS:   Implant Name Type Inv. Item Serial No. Manufacturer Lot No. LRB No. Used Action  TECNIS EYHANCE TORIC II IOL Intraocular Lens  [id number] [employer]  Left 1 Implanted     [id number]  18.5 D Toric intraocular lens with 3.75 diopters of cylindrical power with axis orientation at 6 degrees.     SURGEON:  Wyonia Hough, MD   ANESTHESIA:  Topical with tetracaine drops and 2% Xylocaine jelly, augmented with 1% preservative-free intracameral lidocaine.  COMPLICATIONS:  None.   DESCRIPTION OF PROCEDURE:  The patient was identified in the holding room and transported to the operating suite and placed in the supine position under the operating microscope.  The left eye was identified as the operative eye, and it was prepped and draped in the usual sterile ophthalmic fashion.    A clear-corneal paracentesis incision was made at the 1:30 position.  0.5 ml of preservative-free 1% lidocaine was injected into the anterior chamber. The anterior chamber was filled with Viscoat.  A 2.4 millimeter near clear corneal incision was then made at the 10:30 position.  A cystotome and capsulorrhexis forceps were then used to make a curvilinear capsulorrhexis.  Hydrodissection and hydrodelineation were then performed using balanced salt solution.   Phacoemulsification was then used in stop and chop fashion to remove the lens, nucleus and epinucleus.  The remaining cortex was aspirated using the irrigation and aspiration handpiece.  Provisc viscoelastic was then placed into the  capsular bag to distend it for lens placement.  The Verion digital marker was used to align the implant at the intended axis.   A Toric lens was then injected into the capsular bag.  It was rotated clockwise until the axis marks on the lens were approximately 15 degrees in the counterclockwise direction to the intended alignment.  The viscoelastic was aspirated from the eye using the irrigation aspiration handpiece.  Then, a Koch spatula through the sideport incision was used to rotate the lens in a clockwise direction until the axis markings of the intraocular lens were lined up with the Verion alignment.  Balanced salt solution was then used to hydrate the wounds. Vigamox 0.2 ml of a '1mg'$  per ml solution was injected into the anterior chamber for a dose of 0.2 mg of intracameral antibiotic at the completion of the case.    The eye was noted to have a physiologic pressure and there was no wound leak noted.   Timolol and Brimonidine drops were applied to the eye.  The patient was taken to the recovery room in stable condition having had no complications of anesthesia or surgery.  Javonn Gauger 11/24/2021, 9:51 AM

## 2021-11-25 ENCOUNTER — Encounter: Payer: Self-pay | Admitting: Ophthalmology

## 2021-12-08 ENCOUNTER — Other Ambulatory Visit: Payer: Self-pay | Admitting: Neurological Surgery

## 2021-12-08 DIAGNOSIS — M47816 Spondylosis without myelopathy or radiculopathy, lumbar region: Secondary | ICD-10-CM

## 2021-12-12 ENCOUNTER — Ambulatory Visit
Admission: RE | Admit: 2021-12-12 | Discharge: 2021-12-12 | Disposition: A | Discharge status: Home / Self Care | Payer: Medicare HMO | Source: Ambulatory Visit | Attending: Neurological Surgery | Admitting: Neurological Surgery

## 2021-12-12 DIAGNOSIS — M47816 Spondylosis without myelopathy or radiculopathy, lumbar region: Secondary | ICD-10-CM

## 2021-12-25 ENCOUNTER — Encounter: Payer: Self-pay | Admitting: Gastroenterology

## 2021-12-25 NOTE — H&P (Signed)
Pre-Procedure H&P   Patient ID: Jeffrey [name] is a 76 y.o. male.  Gastroenterology Provider: [name], DO  Referring Provider: Octavia Bruckner, PA PCP: [name], MD  Date: 12/26/2021  HPI Jeffrey [name] is a 76 y.o. male who presents today for Colonoscopy for change in bowel habits, elevated fecal calprotectin.  Patient over the last 2 months has experienced change in bowel habits will have 2 to 3 days without bowel movement and then have subsequent diarrhea.  He has lost approximately 30 pounds as well going from 265-->238. He has not noted melena or hematochezia.  Fecal calprotectin was elevated at 318 with negative infectious stool work-up, CRP 4 ESR 6 creatinine 1.1.  Hemoglobin 14.5 MCV 84 platelets 169,000.  He is actively on Celebrex and omeprazole.  He does have some abdominal discomfort in RLQ.  Diarrheal symptoms are improved by Imodium use. He did undergo colonoscopy in 2018 demonstrating internal hemorrhoids but otherwise normal.  EGD in 2018 and 2021 demonstrating Barrett's esophagus without dysplasia.   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 followed by neurology. 2 year follow up from now.   Chronic kidney disease    DDD (degenerative disc disease), lumbar    Depression    ED (erectile dysfunction)    Esophageal candidiasis ([place] 09/2019   treated with fluconazole for 2 weeks.    GERD (gastroesophageal reflux disease)    Headache 10/2019   silent migraine causes vision problems   History of hiatal hernia    Hyperlipidemia    Hypertension    Lumbar radiculitis    Multiple thyroid nodules    Sleep apnea    does not use a cpap machine.    Tremors of nervous system    Vitiligo     Past  Surgical History:  Procedure Laterality Date   APPENDECTOMY     CATARACT EXTRACTION W/PHACO Left 11/24/2021   Procedure: CATARACT EXTRACTION PHACO AND INTRAOCULAR LENS PLACEMENT ([place] TORIC LENS 7.32 01:01.2;  Surgeon: BLeandrew Koyanagi MD;  [place]  Service: Ophthalmology;  Laterality: Left;   cheek surgery Right    stainless steel in right cheek   COLONOSCOPY     COLONOSCOPY WITH PROPOFOL N/A 02/19/2017   Procedure: COLONOSCOPY WITH PROPOFOL;  Surgeon: Toledo, TBenay Pike MD;  Location: [facility];  Service: Gastroenterology;  Laterality: N/A;   ESOPHAGOGASTRODUODENOSCOPY     ESOPHAGOGASTRODUODENOSCOPY (EGD) WITH PROPOFOL N/A 02/19/2017   Procedure: ESOPHAGOGASTRODUODENOSCOPY (EGD) WITH PROPOFOL;  Surgeon: Toledo, TBenay Pike MD;  Location: [facility];  Service: Gastroenterology;  Laterality: N/A;   ESOPHAGOGASTRODUODENOSCOPY (EGD) WITH PROPOFOL N/A 09/27/2019   Procedure: ESOPHAGOGASTRODUODENOSCOPY (EGD) WITH PROPOFOL;  Surgeon: Toledo, TBenay Pike MD;  Location: [facility];  Service: Gastroenterology;  Laterality: N/A;   FUNCTIONAL ENDOSCOPIC SINUS SURGERY     uvuloplasty done for OSA   HERNIA REPAIR     hiatal   JOINT REPLACEMENT Right 2012   THR   JOINT REPLACEMENT Bilateral    TKR   TOTAL HIP ARTHROPLASTY Left 07/09/2021   Procedure: TOTAL HIP ARTHROPLASTY ANTERIOR APPROACH;  Surgeon: AGaynelle Arabian MD;  [facility];  Service: Orthopedics;  Laterality:  Left;    Family History No h/o GI disease or malignancy  Review of Systems  Constitutional:  Negative for activity change, appetite change, chills, diaphoresis, fatigue, fever and unexpected weight change.  HENT:  Negative for trouble swallowing and voice change.   Respiratory:  Negative for shortness of breath and wheezing.   Cardiovascular:  Negative for chest pain, palpitations and leg swelling.  Gastrointestinal:  Positive for constipation and diarrhea. Negative for abdominal  distention, abdominal pain, anal bleeding, blood in stool, nausea and vomiting.  Musculoskeletal:  Negative for arthralgias and myalgias.  Skin:  Negative for color change and pallor.  Neurological:  Negative for dizziness, syncope and weakness.  Psychiatric/Behavioral:  Negative for confusion. The patient is not nervous/anxious.   All other systems reviewed and are negative.    Medications No current facility-administered medications on file prior to encounter.   Current Outpatient Medications on File Prior to Encounter  Medication Sig Dispense Refill   amLODipine (NORVASC) 10 MG tablet Take 10 mg by mouth daily.      chlorthalidone (HYGROTON) 25 MG tablet Take 12.5 mg daily by mouth.     lisinopril (PRINIVIL,ZESTRIL) 40 MG tablet Take 40 mg by mouth daily.     Ascorbic Acid (VITAMIN C PO) Take by mouth daily.     b complex vitamins capsule Take 1 capsule by mouth daily. (Patient not taking: Reported on 11/13/2021)     celecoxib (CELEBREX) 200 MG capsule Take 200 mg by mouth 2 (two) times daily.     clobetasol cream (TEMOVATE) 2.94 % Apply 1 application. topically daily as needed (Dry skin on fingers).     clotrimazole-betamethasone (LOTRISONE) cream Apply 1 application. topically daily as needed (Rash). (Patient not taking: Reported on 11/13/2021)     HYDROcodone-acetaminophen (NORCO/VICODIN) 5-325 MG tablet Take 1-2 tablets by mouth every 6 (six) hours as needed for moderate pain or severe pain. 42 tablet 0   methocarbamol (ROBAXIN) 500 MG tablet Take 1 tablet (500 mg total) by mouth every 6 (six) hours as needed for muscle spasms. (Patient not taking: Reported on 10/21/2021) 40 tablet 0   Omega-3 Fatty Acids (FISH OIL PO) Take by mouth daily.     potassium chloride (KLOR-CON) 20 MEQ packet Take 20 mEq by mouth 2 (two) times daily.     tadalafil (CIALIS) 5 MG tablet Take 5 mg by mouth daily.     traMADol (ULTRAM) 50 MG tablet Take 1-2 tablets (50-100 mg total) by mouth every 6 (six) hours as  needed for moderate pain. 40 tablet 0    Pertinent medications related to GI and procedure were reviewed by me with the patient prior to the procedure   Current Facility-Administered Medications:    0.9 %  sodium chloride infusion, , Intravenous, Continuous, [name], DO, Last Rate: 20 mL/hr at 12/26/21 0717, New Bag at 12/26/21 7654      Allergies  Allergen Reactions   Paroxetine Hcl Other (See Comments)    Anger, not a nice person   Mirtazapine Other (See Comments)    unkn   Atorvastatin Other (See Comments)    Eye pain/ temple pain   Cefdinir Itching and Rash    Rash-gets blistery looking rash   Ciprofloxacin Rash   Doxazosin Anxiety   Fluoxetine Other (See Comments)    Sexual side effects   Allergies were reviewed by me prior to the procedure  Objective   Body mass index is 31.4 kg/m. Vitals:   12/26/21 0705  BP: Marland Kitchen)  121/91  Pulse: 87  Resp: 18  Temp: (!) 96.7 F (35.9 C)  TempSrc: Temporal  SpO2: 97%  Weight: 108 kg  Height: 6' 1"  (1.854 m)     Physical Exam Vitals and nursing note reviewed.  Constitutional:      General: He is not in acute distress.    Appearance: Normal appearance. He is not ill-appearing, toxic-appearing or diaphoretic.  HENT:     Head: Normocephalic and atraumatic.     Nose: Nose normal.     Mouth/Throat:     Mouth: Mucous membranes are moist.     Pharynx: Oropharynx is clear.  Eyes:     General: No scleral icterus.    Extraocular Movements: Extraocular movements intact.  Cardiovascular:     Rate and Rhythm: Normal rate and regular rhythm.     Heart sounds: Normal heart sounds. No murmur heard.    No friction rub. No gallop.  Pulmonary:     Effort: Pulmonary effort is normal. No respiratory distress.     Breath sounds: Normal breath sounds. No wheezing, rhonchi or rales.  Abdominal:     General: Bowel sounds are normal. There is no distension.     Palpations: Abdomen is soft.     Tenderness: There is no  abdominal tenderness. There is no guarding or rebound.  Musculoskeletal:     Cervical back: Neck supple.     Right lower leg: No edema.     Left lower leg: No edema.  Skin:    General: Skin is warm and dry.     Coloration: Skin is not jaundiced or pale.  Neurological:     General: No focal deficit present.     Mental Status: He is alert and oriented to person, place, and time. Mental status is at baseline.  Psychiatric:        Mood and Affect: Mood normal.        Behavior: Behavior normal.        Thought Content: Thought content normal.        Judgment: Judgment normal.      Assessment:  Jeffrey [name] is a 76 y.o. male  who presents today for Colonoscopy for change in bowel habits, elevated fecal calprotectin.  Plan:  Colonoscopy with possible intervention today  Colonoscopy with possible biopsy, control of bleeding, polypectomy, and interventions as necessary has been discussed with the patient/patient representative. Informed consent was obtained from the patient/patient representative after explaining the indication, nature, and risks of the procedure including but not limited to death, bleeding, perforation, missed neoplasm/lesions, cardiorespiratory compromise, and reaction to medications. Opportunity for questions was given and appropriate answers were provided. Patient/patient representative has verbalized understanding is amenable to undergoing the procedure.   [name], DO  [facility]  Portions of the record may have been created with voice recognition software. Occasional wrong-word or 'sound-a-like' substitutions may have occurred due to the inherent limitations of voice recognition software.  Read the chart carefully and recognize, using context, where substitutions may have occurred.

## 2021-12-26 ENCOUNTER — Encounter: Payer: Self-pay | Admitting: Gastroenterology

## 2021-12-26 ENCOUNTER — Encounter
Admission: RE | Disposition: A | Discharge status: Home / Self Care | Payer: Self-pay | Source: Home / Self Care | Attending: Gastroenterology

## 2021-12-26 ENCOUNTER — Ambulatory Visit: Payer: Medicare HMO | Admitting: Anesthesiology

## 2021-12-26 ENCOUNTER — Ambulatory Visit
Admission: RE | Admit: 2021-12-26 | Discharge: 2021-12-26 | Disposition: A | Discharge status: Home / Self Care | Payer: Medicare HMO | Attending: Gastroenterology | Admitting: Gastroenterology

## 2021-12-26 DIAGNOSIS — I10 Essential (primary) hypertension: Secondary | ICD-10-CM | POA: Diagnosis not present

## 2021-12-26 DIAGNOSIS — K640 First degree hemorrhoids: Secondary | ICD-10-CM | POA: Insufficient documentation

## 2021-12-26 DIAGNOSIS — Z8719 Personal history of other diseases of the digestive system: Secondary | ICD-10-CM | POA: Diagnosis not present

## 2021-12-26 DIAGNOSIS — K529 Noninfective gastroenteritis and colitis, unspecified: Secondary | ICD-10-CM | POA: Insufficient documentation

## 2021-12-26 DIAGNOSIS — G4730 Sleep apnea, unspecified: Secondary | ICD-10-CM | POA: Insufficient documentation

## 2021-12-26 DIAGNOSIS — K219 Gastro-esophageal reflux disease without esophagitis: Secondary | ICD-10-CM | POA: Insufficient documentation

## 2021-12-26 DIAGNOSIS — K5710 Diverticulosis of small intestine without perforation or abscess without bleeding: Secondary | ICD-10-CM | POA: Diagnosis not present

## 2021-12-26 DIAGNOSIS — Z6831 Body mass index (BMI) 31.0-31.9, adult: Secondary | ICD-10-CM | POA: Insufficient documentation

## 2021-12-26 DIAGNOSIS — D72820 Lymphocytosis (symptomatic): Secondary | ICD-10-CM | POA: Insufficient documentation

## 2021-12-26 DIAGNOSIS — Z79899 Other long term (current) drug therapy: Secondary | ICD-10-CM | POA: Insufficient documentation

## 2021-12-26 DIAGNOSIS — E669 Obesity, unspecified: Secondary | ICD-10-CM | POA: Insufficient documentation

## 2021-12-26 HISTORY — PX: COLONOSCOPY WITH PROPOFOL: SHX5780

## 2021-12-26 SURGERY — COLONOSCOPY WITH PROPOFOL
Anesthesia: General

## 2021-12-26 MED ORDER — PROPOFOL 10 MG/ML IV BOLUS
INTRAVENOUS | Status: AC
  Filled 2021-12-26: qty 40

## 2021-12-26 MED ORDER — SODIUM CHLORIDE 0.9 % IV SOLN: INTRAVENOUS | Status: DC

## 2021-12-26 MED ORDER — PHENYLEPHRINE 80 MCG/ML (10ML) SYRINGE FOR IV PUSH (FOR BLOOD PRESSURE SUPPORT)
PREFILLED_SYRINGE | INTRAVENOUS | Status: DC | PRN
  Administered 2021-12-26 (×3): 80 ug via INTRAVENOUS

## 2021-12-26 MED ORDER — LIDOCAINE HCL (PF) 2 % IJ SOLN
INTRAMUSCULAR | Status: AC
  Filled 2021-12-26: qty 25

## 2021-12-26 MED ORDER — PROPOFOL 10 MG/ML IV BOLUS
INTRAVENOUS | Status: DC | PRN
  Administered 2021-12-26: 40 mg via INTRAVENOUS
  Administered 2021-12-26: 30 mg via INTRAVENOUS
  Administered 2021-12-26: 40 mg via INTRAVENOUS
  Administered 2021-12-26: 80 mg via INTRAVENOUS
  Administered 2021-12-26 (×3): 40 mg via INTRAVENOUS

## 2021-12-26 MED ORDER — PROPOFOL 1000 MG/100ML IV EMUL
INTRAVENOUS | Status: AC
  Filled 2021-12-26: qty 100

## 2021-12-26 MED ORDER — PHENYLEPHRINE 80 MCG/ML (10ML) SYRINGE FOR IV PUSH (FOR BLOOD PRESSURE SUPPORT)
PREFILLED_SYRINGE | INTRAVENOUS | Status: AC
  Filled 2021-12-26: qty 20

## 2021-12-26 MED ORDER — EPHEDRINE SULFATE (PRESSORS) 50 MG/ML IJ SOLN
INTRAMUSCULAR | Status: DC | PRN
  Administered 2021-12-26: 10 mg via INTRAVENOUS

## 2021-12-26 MED ORDER — EPHEDRINE 5 MG/ML INJ
INTRAVENOUS | Status: AC
  Filled 2021-12-26: qty 5

## 2021-12-26 NOTE — Op Note (Signed)
[facility] Gastroenterology Patient Name: Jeffrey [name] Procedure Date: 12/26/2021 7:20 AM MRN: [id number] Account #: [id number] Date of Birth: 1946-04-10 Admit Type: Outpatient Age: 76 Room: [facility] ENDO ROOM 1 Gender: Male Note Status: Finalized Instrument Name: Colonoscope [id number] Procedure:             Colonoscopy Indications:           Chronic diarrhea Providers:             [name] DO, DO Referring MD:          Andres Labrum, MD (Referring MD) Medicines:             Monitored Anesthesia Care Complications:         No immediate complications. Estimated blood loss:                         Minimal. Procedure:             Pre-Anesthesia Assessment:                        - Prior to the procedure, a History and Physical was                         performed, and patient medications and allergies were                         reviewed. The patient is competent. The risks and                         benefits of the procedure and the sedation options and                         risks were discussed with the patient. All questions                         were answered and informed consent was obtained.                         Patient identification and proposed procedure were                         verified by the physician, the nurse, the anesthetist                         and the technician in the endoscopy suite. Mental                         Status Examination: alert and oriented. Airway                         Examination: normal oropharyngeal airway and neck                         mobility. Respiratory Examination: clear to                         auscultation. CV Examination: RRR, no murmurs, no S3  or S4. Prophylactic Antibiotics: The patient does not                         require prophylactic antibiotics. Prior                         Anticoagulants: The patient has taken no previous                          anticoagulant or antiplatelet agents. ASA Grade                         Assessment: III - A patient with severe systemic                         disease. After reviewing the risks and benefits, the                         patient was deemed in satisfactory condition to                         undergo the procedure. The anesthesia plan was to use                         monitored anesthesia care (MAC). Immediately prior to                         administration of medications, the patient was                         re-assessed for adequacy to receive sedatives. The                         heart rate, respiratory rate, oxygen saturations,                         blood pressure, adequacy of pulmonary ventilation, and                         response to care were monitored throughout the                         procedure. The physical status of the patient was                         re-assessed after the procedure.                        After obtaining informed consent, the colonoscope was                         passed under direct vision. Throughout the procedure,                         the patient's blood pressure, pulse, and oxygen                         saturations were monitored continuously. The  Colonoscope was introduced through the anus and                         advanced to the the terminal ileum, with                         identification of the appendiceal orifice and IC                         valve. The colonoscopy was performed without                         difficulty. The patient tolerated the procedure well.                         The quality of the bowel preparation was evaluated                         using the BBPS [facility] Bowel Preparation Scale) with                         scores of: Right Colon = 3, Transverse Colon = 3 and                         Left Colon = 3 (entire mucosa seen well with no                         residual staining,  small fragments of stool or opaque                         liquid). The total BBPS score equals 9. The terminal                         ileum, ileocecal valve, appendiceal orifice, and                         rectum were photographed. Findings:      The perianal and digital rectal examinations were normal. Pertinent       negatives include normal sphincter tone.      The terminal ileum contained a single diminutive diverticulum. Estimated       blood loss: none.      The terminal ileum appeared normal. Biopsies were taken with a cold       forceps for histology. Estimated blood loss was minimal.      Non-bleeding internal hemorrhoids were found during retroflexion. The       hemorrhoids were Grade I (internal hemorrhoids that do not prolapse).       Estimated blood loss: none.      Normal mucosa was found in the entire colon. Biopsies for histology were       taken with a cold forceps from the right colon and left colon for       evaluation of microscopic colitis. Estimated blood loss was minimal.      Retroflexion in the right colon was performed.      The exam was otherwise without abnormality on direct and retroflexion       views. Impression:            -  Ileal diverticulum.                        - The examined portion of the ileum was normal.                         Biopsied.                        - Non-bleeding internal hemorrhoids.                        - Normal mucosa in the entire examined colon. Biopsied.                        - The examination was otherwise normal on direct and                         retroflexion views. Recommendation:        - Patient has a contact number available for                         emergencies. The signs and symptoms of potential                         delayed complications were discussed with the patient.                         Return to normal activities tomorrow. Written                         discharge instructions were provided to  the patient.                        - Resume previous diet.                        - Discharge patient to home.                        - Continue present medications.                        - No aspirin, ibuprofen, naproxen, or other                         non-steroidal anti-inflammatory drugs for 5 days after                         biopsy.                        - Await pathology results.                        - Repeat colonoscopy for surveillance based on                         pathology results.                        - Return to GI office as previously scheduled.                        -  The findings and recommendations were discussed with                         the patient. Procedure Code(s):     --- Professional ---                        [phone], Colonoscopy, flexible; with biopsy, single or                         multiple Diagnosis Code(s):     --- Professional ---                        K64.0, First degree hemorrhoids                        K52.9, Noninfective gastroenteritis and colitis,                         unspecified                        K57.10, Diverticulosis of small intestine without                         perforation or abscess without bleeding CPT copyright 2019 American Medical Association. All rights reserved. The codes documented in this report are preliminary and upon coder review may  be revised to meet current compliance requirements. Attending Participation:      I personally performed the entire procedure. Volney American, DO [name] DO, DO 12/26/2021 8:07:00 AM This report has been signed electronically. Number of Addenda: 0 Note Initiated On: 12/26/2021 7:20 AM Scope Withdrawal Time: 0 hours 19 minutes 54 seconds  Total Procedure Duration: 0 hours 23 minutes 19 seconds  Estimated Blood Loss:  Estimated blood loss was minimal.      [facility]

## 2021-12-26 NOTE — Anesthesia Preprocedure Evaluation (Signed)
Anesthesia Evaluation  Patient identified by MRN, date of birth, ID band Patient awake    Reviewed: Allergy & Precautions, NPO status , Patient's Chart, lab work & pertinent test results, reviewed documented beta blocker date and time   Airway Mallampati: II  TM Distance: >3 FB Neck ROM: Full    Dental  (+) Poor Dentition, Dental Advisory Given   Pulmonary sleep apnea ,    Pulmonary exam normal breath sounds clear to auscultation       Cardiovascular hypertension, Pt. on medications  Rhythm:Regular Rate:Bradycardia  33m saccular aneurysm left ophthalmic artery   Neuro/Psych  Headaches, PSYCHIATRIC DISORDERS Anxiety Depression Lumbar radiculitis Essential tremors  Neuromuscular disease    GI/Hepatic Neg liver ROS, hiatal hernia, GERD  Medicated and Controlled,Hx/o Barrett's esophagus   Endo/Other  Multiple thyroid nodules Obesity  Renal/GU negative Renal ROS   BPH    Musculoskeletal  (+) Arthritis , Osteoarthritis,  OA left hip   Abdominal (+) + obese,   Peds  Hematology  (+) Blood dyscrasia, anemia ,   Anesthesia Other Findings   Reproductive/Obstetrics ED                             Anesthesia Physical  Anesthesia Plan  ASA: 3  Anesthesia Plan: General   Post-op Pain Management: Minimal or no pain anticipated   Induction: Intravenous  PONV Risk Score and Plan: 2 and Propofol infusion and TIVA  Airway Management Planned: Nasal Cannula and Natural Airway  Additional Equipment: None  Intra-op Plan:   Post-operative Plan:   Informed Consent: I have reviewed the patients History and Physical, chart, labs and discussed the procedure including the risks, benefits and alternatives for the proposed anesthesia with the patient or authorized representative who has indicated his/her understanding and acceptance.     Dental advisory given  Plan Discussed with: CRNA and  Anesthesiologist  Anesthesia Plan Comments: (Patient consented for risks of anesthesia including but not limited to:  - adverse reactions to medications - risk of airway placement if required - damage to eyes, teeth, lips or other oral mucosa - nerve damage due to positioning  - sore throat or hoarseness - Damage to heart, brain, nerves, lungs, other parts of body or loss of life  Patient voiced understanding.)        Anesthesia Quick Evaluation

## 2021-12-26 NOTE — Anesthesia Postprocedure Evaluation (Signed)
Anesthesia Post Note  Patient: Jeffrey [name]  Procedure(s) Performed: COLONOSCOPY WITH PROPOFOL  Patient location during evaluation: Endoscopy Anesthesia Type: General Level of consciousness: awake and alert Pain management: pain level controlled Vital Signs Assessment: post-procedure vital signs reviewed and stable Respiratory status: spontaneous breathing, nonlabored ventilation, respiratory function stable and patient connected to nasal cannula oxygen Cardiovascular status: blood pressure returned to baseline and stable Postop Assessment: no apparent nausea or vomiting Anesthetic complications: no   No notable events documented.   Last Vitals:  Vitals:   12/26/21 0810 12/26/21 0818  BP: 108/83 118/87  Pulse:    Resp:    Temp:    SpO2:      Last Pain:  Vitals:   12/26/21 0820  TempSrc:   PainSc: 0-No pain                 Ilene Qua

## 2021-12-26 NOTE — Interval H&P Note (Signed)
History and Physical Interval Note: Preprocedure H&P from 12/26/21  was reviewed and there was no interval change after seeing and examining the patient.  Written consent was obtained from the patient after discussion of risks, benefits, and alternatives. Patient has consented to proceed with Colonoscopy with possible intervention   12/26/2021 7:23 AM  Jeffrey [name]  has presented today for surgery, with the diagnosis of R19.4  - Change in bowel habits R19.7  - Diarrhea, unspecified type R15.2  - Fecal urgency R19.5 - Elevated fecal calprotectin.  The various methods of treatment have been discussed with the patient and family. After consideration of risks, benefits and other options for treatment, the patient has consented to  Procedure(s): COLONOSCOPY WITH PROPOFOL (N/A) as a surgical intervention.  The patient's history has been reviewed, patient examined, no change in status, stable for surgery.  I have reviewed the patient's chart and labs.  Questions were answered to the patient's satisfaction.     [name]

## 2021-12-26 NOTE — Transfer of Care (Signed)
Immediate Anesthesia Transfer of Care Note  Patient: Jeffrey [name]  Procedure(s) Performed: COLONOSCOPY WITH PROPOFOL  Patient Location: Endoscopy Unit  Anesthesia Type:General  Level of Consciousness: drowsy  Airway & Oxygen Therapy: Patient Spontanous Breathing and Patient connected to nasal cannula oxygen  Post-op Assessment: Report given to RN, Post -op Vital signs reviewed and stable and Patient moving all extremities  Post vital signs: Reviewed and stable  Last Vitals:  Vitals Value Taken Time  BP 110/82 12/26/21 0801  Temp 35.9 C 12/26/21 0800  Pulse 53 12/26/21 0804  Resp 11 12/26/21 0804  SpO2 92 % 12/26/21 0804  Vitals shown include unvalidated device data.  Last Pain:  Vitals:   12/26/21 0800  TempSrc: Temporal         Complications: No notable events documented.

## 2021-12-29 ENCOUNTER — Encounter: Payer: Self-pay | Admitting: Gastroenterology

## 2021-12-29 LAB — SURGICAL PATHOLOGY

## 2022-01-14 ENCOUNTER — Ambulatory Visit: Admit: 2022-01-14 | Payer: Medicare HMO | Admitting: Ophthalmology

## 2022-01-14 SURGERY — PHACOEMULSIFICATION, CATARACT, WITH IOL INSERTION
Anesthesia: Topical | Laterality: Left

## 2022-01-28 ENCOUNTER — Ambulatory Visit: Admission: RE | Admit: 2022-01-28 | Payer: Medicare HMO | Source: Home / Self Care | Admitting: Ophthalmology

## 2022-01-28 ENCOUNTER — Encounter: Admission: RE | Payer: Self-pay | Source: Home / Self Care

## 2022-01-28 SURGERY — PHACOEMULSIFICATION, CATARACT, WITH IOL INSERTION
Anesthesia: Topical | Laterality: Right

## 2022-02-05 ENCOUNTER — Ambulatory Visit: Payer: Medicare HMO | Admitting: Urology

## 2022-02-05 ENCOUNTER — Encounter: Payer: Self-pay | Admitting: Urology

## 2022-02-05 VITALS — BP 144/87 | HR 80 | Ht 73.0 in | Wt 230.0 lb

## 2022-02-05 DIAGNOSIS — R102 Pelvic and perineal pain: Secondary | ICD-10-CM | POA: Diagnosis not present

## 2022-02-05 DIAGNOSIS — Z125 Encounter for screening for malignant neoplasm of prostate: Secondary | ICD-10-CM | POA: Diagnosis not present

## 2022-02-05 DIAGNOSIS — N400 Enlarged prostate without lower urinary tract symptoms: Secondary | ICD-10-CM

## 2022-02-05 DIAGNOSIS — R300 Dysuria: Secondary | ICD-10-CM

## 2022-02-05 DIAGNOSIS — N5312 Painful ejaculation: Secondary | ICD-10-CM

## 2022-02-05 DIAGNOSIS — N401 Enlarged prostate with lower urinary tract symptoms: Secondary | ICD-10-CM

## 2022-02-05 DIAGNOSIS — R3913 Splitting of urinary stream: Secondary | ICD-10-CM

## 2022-02-05 DIAGNOSIS — R351 Nocturia: Secondary | ICD-10-CM

## 2022-02-05 DIAGNOSIS — N5089 Other specified disorders of the male genital organs: Secondary | ICD-10-CM

## 2022-02-05 DIAGNOSIS — R3912 Poor urinary stream: Secondary | ICD-10-CM

## 2022-02-05 LAB — BLADDER SCAN AMB NON-IMAGING: Scan Result: 89

## 2022-02-05 MED ORDER — TADALAFIL 5 MG PO TABS: 5.0000 mg | ORAL_TABLET | Freq: Every day | ORAL | 11 refills | Status: DC

## 2022-02-05 NOTE — Progress Notes (Signed)
02/05/22 11:09 AM   Jeffrey [name] 09/24/1945 [id number]  CC: Lower urinary tract symptoms, elevated PSA, split urinary stream, lower abdominal/pelvic pain  HPI: 76 year old male with extensive urologic history.  He was previously seen by Dr. Matilde Sprang with [facility] urological, as well as Dr. Yves Dill.  He had bothersome urinary symptoms and mildly elevated PSA of 4.2 and underwent a prostate MRI in June 2021 that showed a 54 g prostate with no suspicious lesions.  He underwent a UroLift with Dr. Eliberto Ivory in July 2021.  He had significant dysuria and hematuria after the UroLift and denies that he had any improvement in his urinary symptoms afterwards.  He continues to report some intermittent dysuria, genital pain, pain with ejaculations, weak/split urinary stream, and nocturia 5 or more times at night. He also has sleep apnea and noncompliant with CPAP machine.  He has not tolerated alpha blockers well in the past.  He also has chronic back pain as well as some lower abdominal pain that he wonders if could be related to the bladder.  Recent urinalysis earlier this month with PCP was completely benign.  PVR today is normal at 83m.  A CT in January 2022 for the abdominal pain showed UroLift implants within a prominent prostate gland, and prostate measured 83 g.  He denies any gross hematuria or urinary tract infections.  He takes Cialis 5 mg daily for ED which also improves the urinary symptoms somewhat.   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 followed by neurology. 2 year follow up from now.   Chronic kidney disease    DDD (degenerative disc disease), lumbar     Depression    ED (erectile dysfunction)    [place]   treated with fluconazole for 2 weeks.    GERD (gastroesophageal reflux disease)    Headache 10/2019   silent migraine causes vision problems   History of hiatal hernia    Hyperlipidemia    Hypertension    Lumbar radiculitis    Multiple thyroid nodules    Sleep apnea    does not use a cpap machine.    Tremors of nervous system    Vitiligo     Surgical History: Past Surgical History:  Procedure Laterality Date   APPENDECTOMY     CATARACT EXTRACTION W/PHACO Left 11/24/2021   Procedure: CATARACT EXTRACTION PHACO AND INTRAOCULAR LENS PLACEMENT ([place] LENS 7.32 01:01.2;  Surgeon: BrLeandrew KoyanagiMD;  [place] Op[place];  Laterality: Left;   cheek surgery Right    stainless steel in right cheek   COLONOSCOPY     COLONOSCOPY WITH PROPOFOL N/A 02/19/2017   Procedure: COLONOSCOPY WITH PROPOFOL;  Surgeon: Toledo, TeBenay PikeMD;  Location: [facility] ENDOSCOPY;  Service: Gastroenterology;  Laterality: N/A;   COLONOSCOPY WITH PROPOFOL N/A 12/26/2021   Procedure: COLONOSCOPY WITH PROPOFOL;  Surgeon: RuAnnamaria HellingDO;  [facility];  Service: Gastroenterology;  Laterality: N/A;   ESOPHAGOGASTRODUODENOSCOPY     ESOPHAGOGASTRODUODENOSCOPY (EGD) WITH PROPOFOL N/A 02/19/2017   Procedure: ESOPHAGOGASTRODUODENOSCOPY (EGD) WITH PROPOFOL;  Surgeon: Toledo, Benay Pike, MD;  Location: [facility] ENDOSCOPY;  Service: Gastroenterology;  Laterality: N/A;   ESOPHAGOGASTRODUODENOSCOPY (EGD) WITH PROPOFOL N/A 09/27/2019   Procedure: ESOPHAGOGASTRODUODENOSCOPY (EGD) WITH PROPOFOL;  Surgeon: Toledo, Benay Pike, MD;  Location: [facility] ENDOSCOPY;  Service: Gastroenterology;  Laterality: N/A;   FUNCTIONAL ENDOSCOPIC SINUS SURGERY     uvuloplasty done for OSA   HERNIA REPAIR     hiatal   JOINT REPLACEMENT Right 2012   THR   JOINT REPLACEMENT Bilateral    TKR   TOTAL HIP  ARTHROPLASTY Left 07/09/2021   Procedure: TOTAL HIP ARTHROPLASTY ANTERIOR APPROACH;  Surgeon: Gaynelle Arabian, MD;  Location: [facility];  Service: Orthopedics;  Laterality: Left;    Social History:  reports that he has never smoked. He has never been exposed to tobacco smoke. He has never used smokeless tobacco. He reports that he does not currently use alcohol. He reports that he does not use drugs.  Physical Exam: BP (!) 144/87   Pulse 80   Ht '6\' 1"'$  (1.854 m)   Wt 230 lb (104.3 kg)   BMI 30.34 kg/m    Constitutional:  Alert and oriented, No acute distress. Cardiovascular: No clubbing, cyanosis, or edema. Respiratory: Normal respiratory effort, no increased work of breathing. GI: Abdomen is soft, nontender, nondistended, no abdominal masses   Laboratory Data: Reviewed, see HPI  Pertinent Imaging: I have personally viewed and interpreted the CT from January 2022 with no hydronephrosis or stones,Prostate measures 83g, UroLift implants in place, potentially near the base of the bladder on the left side  Assessment & Plan:   76 year old male with mildly elevated PSA and persistent mixed urinary symptoms and pelvic pain despite UroLift with Dr. Yves Dill in July 2021(I reviewed the outside notes from Dr. Yves Dill).  Urinalysis benign, PVR normal, CT with 83 g prostate and no obvious urologic abnormalities.  Has not tolerated alpha blockers previously secondary to side effects and bothersome retrograde ejaculation.  Recent PSA 5.3 essentially stable over the last few years, with recent negative prostate MRI, reassuring PSA density of 0.06, I do not feel he warrants prostate biopsy at this time.  We also reviewed the AUA guidelines that do not recommend routine screening in men over age 20.  We discussed possible etiologies of his symptoms including persistent BPH, OAB, migration of UroLift implants, stricture/bladder neck contracture.  He is unwilling to consider any surgical treatments that could result  in retrograde ejaculation at this time.  I do not have a good answer for his pelvic/suprapubic and testicular pain.  I recommended starting with cystoscopy to evaluate for any anatomic abnormality or erosion of UroLift implants.  He had a number of questions about prostatic artery embolization, and I also answered those to the best of my ability today.  That would be an option to preserve ejaculatory function if his symptoms are felt to be from BPH.  Could also consider trial of an OAB medication.  We will also need to consider resuming CPAP for his sleep apnea which may improve his nocturia.  Cialis 5 mg daily refilled Follow-up for cystoscopy   Nickolas Madrid, MD 02/05/2022  [facility] [address] [place], [place] [address] [phone]

## 2022-02-05 NOTE — Patient Instructions (Signed)

## 2022-02-11 ENCOUNTER — Ambulatory Visit: Payer: Medicare HMO | Attending: Neurological Surgery | Admitting: Physical Therapy

## 2022-02-11 ENCOUNTER — Encounter: Payer: Self-pay | Admitting: Physical Therapy

## 2022-02-11 DIAGNOSIS — M62838 Other muscle spasm: Secondary | ICD-10-CM | POA: Insufficient documentation

## 2022-02-11 DIAGNOSIS — M6281 Muscle weakness (generalized): Secondary | ICD-10-CM | POA: Diagnosis present

## 2022-02-11 DIAGNOSIS — R262 Difficulty in walking, not elsewhere classified: Secondary | ICD-10-CM | POA: Insufficient documentation

## 2022-02-11 DIAGNOSIS — M5459 Other low back pain: Secondary | ICD-10-CM | POA: Insufficient documentation

## 2022-02-11 NOTE — Therapy (Unsigned)
OUTPATIENT PHYSICAL THERAPY EVALUATION   Patient Name: Jeffrey [name] MRN: [id number] DOB:Aug 24, 1945, 76 y.o., male Today's Date: 02/11/2022   PT End of Session - 02/11/22 1022     Visit Number 1    Number of Visits 24    Date for PT Re-Evaluation 05/06/22    Authorization Type [employer] MEDICARE reporting period from 02/11/2022    Progress Note Due on Visit 10    PT Start Time 0906    PT Stop Time 1000    PT Time Calculation (min) 54 min    Activity Tolerance Patient tolerated treatment well    Behavior During Therapy [facility] for tasks assessed/performed;Flat affect             Past Medical History:  Diagnosis Date   Anal fissure    Anemia    Anxiety    Arthritis    Barrett esophagus    BPH (benign prostatic hyperplasia)    Bruises easily    Cancer ([place])    skin cancer removed with MOHS   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 followed by neurology. 2 year follow up from now.   Chronic kidney disease    DDD (degenerative disc disease), lumbar    Depression    ED (erectile dysfunction)    Esophageal candidiasis ([place] 09/2019   treated with fluconazole for 2 weeks.    GERD (gastroesophageal reflux disease)    Headache 10/2019   silent migraine causes vision problems   History of hiatal hernia    Hyperlipidemia    Hypertension    Lumbar radiculitis    Multiple thyroid nodules    Sleep apnea    does not use a cpap machine.    Tremors of nervous system    Vitiligo    Past Surgical History:  Procedure Laterality Date   APPENDECTOMY     CATARACT EXTRACTION W/PHACO Left 11/24/2021   Procedure: CATARACT EXTRACTION PHACO AND INTRAOCULAR LENS PLACEMENT (IP[place]ama LEFT TORIC LENS 7.32 01:01.2;  Surgeon: BLeandrew Koyanagi MD;  [place]  Service: Ophthalmology;  Laterality: Left;   cheek surgery Right    stainless steel in right cheek   COLONOSCOPY     COLONOSCOPY WITH PROPOFOL N/A 02/19/2017   Procedure: COLONOSCOPY WITH  PROPOFOL;  Surgeon: Toledo, TBenay Pike MD;  Location: [facility];  Service: Gastroenterology;  Laterality: N/A;   COLONOSCOPY WITH PROPOFOL N/A 12/26/2021   Procedure: COLONOSCOPY WITH PROPOFOL;  Surgeon: RAnnamaria Helling DO;  [facility]  Service: Gastroenterology;  Laterality: N/A;   ESOPHAGOGASTRODUODENOSCOPY     ESOPHAGOGASTRODUODENOSCOPY (EGD) WITH PROPOFOL N/A 02/19/2017   Procedure: ESOPHAGOGASTRODUODENOSCOPY (EGD) WITH PROPOFOL;  Surgeon: Toledo, TBenay Pike MD;  Location: [facility];  Service: Gastroenterology;  Laterality: N/A;   ESOPHAGOGASTRODUODENOSCOPY (EGD) WITH PROPOFOL N/A 09/27/2019   Procedure: ESOPHAGOGASTRODUODENOSCOPY (EGD) WITH PROPOFOL;  Surgeon: Toledo, TBenay Pike MD;  Location: [facility];  Service: Gastroenterology;  Laterality: N/A;   FUNCTIONAL ENDOSCOPIC SINUS SURGERY     uvuloplasty done for OSA   HERNIA REPAIR     hiatal   JOINT REPLACEMENT Right 2012   THR   JOINT REPLACEMENT Bilateral    TKR   TOTAL HIP ARTHROPLASTY Left 07/09/2021   Procedure: TOTAL HIP ARTHROPLASTY ANTERIOR APPROACH;  Surgeon: AGaynelle Arabian MD;  Location: [facility];  Service: Orthopedics;  Laterality: Left;   Patient Active Problem List   Diagnosis Date Noted   OA (osteoarthritis) of hip 07/09/2021   Osteoarthritis of left hip 07/09/2021  Essential tremor 12/06/2013   Aneurysm of internal carotid artery 12/06/2013   Anxiety state, unspecified 12/06/2013    PCP: [name], MD  REFERRING PROVIDER: Eustace Moore, MD  REFERRING DIAG: lumbar degenerative disc disease  Rationale for Evaluation and Treatment: Rehabilitation  THERAPY DIAG:  Other low back pain  Difficulty in walking, not elsewhere classified  Muscle weakness (generalized)  Other muscle spasm  ONSET DATE: over many years, chronic  SUBJECTIVE:                                                                                                                                                                                            SUBJECTIVE STATEMENT: Patient reports he is at PT after seeing a surgeon about his low back pain. The neurosurgeon recommended against surgery because his condition is so bad and he might not be any better off after the surgery. He states he has stenosis and osteoporosis. He states the back problem has been going on for many years. He has undergone injections that only helped up to 10 days. He takes some hydrocodone every now and then when it bothers him a lot. He also uses different lotions. He finds heat to be somewhat helpful. He reports his condition is getting worse lately over the last 6 months or more. He states he has had both hips and both knees replaced. He has difficulty walking more than half the store. Pain at his bilateral hip bursae stop him. He is able to walk back out of the store after sitting to rest.  He recently lost weight 272 to 228 lbs over 4 months (on purpose). He is currently fairly sedentary except trying to walk when he can. He denies paresthesia in the legs but the right leg goes numb sometimes after sitting or laying and it gets better when he gets up and walks. Patient states sometimes when he stands up after moving a hose on the ground, he feels a loud pop and suddenly gets a pain that feels like a nerve in the middle of his low back. It also feels like the vertebrae are rubbing together. Diarrhea for the last 4 months  when he is on his diet (followed by GI). MD requested dry needling on PT order.   PERTINENT HISTORY:  Patient is a 76 y.o. male who presents to outpatient physical therapy with a referral for medical diagnosis  lumbar degenerative disc disease. This patient's chief complaints consist of chronic low back pain and bilateral lateral hip pain and intermittent right leg numbness leading to the following functional deficits: difficulty with usual activities such as walking more than  half the store at [employer],  walking, doing housework, folding towels on the bed, using the vacuum, getting up in the morning, sleeping, standing up to go to the bathroom at night, yardwork, stairs, dressing, bathing, bed mobility, transfers, lifting, bending, pulling, pushing, carrying, playing tennis and playing baseball with grandson. 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 Patient denies hx of stroke, seizures, lung problems, heart problems, diabetes, unexplained weight loss, unexplained changes in bowel or bladder problems, unexplained stumbling or dropping things, and spinal surgery    PAIN:  Are you having pain? Yes: NPRS scale: Current: 3/10 (sitting),  Best: 0/10, Worst: 7/10. Pain location: mostly in the lower back, intermittently to the bilateral hip bursae, intermittent numbness in right LE after sitting or laying. Pain in the pubic region when he gets up to go to the bathroom at night.  Pain description: bad ache, numbness in right leg.  Aggravating factors: walking, doing housework, folding towels on the bed, using the vacuum, getting up in the morning, sleeping, standing up to go to the bathroom at night, yardwork, stairs, dressing, bathing.   Relieving factors: lay on the bed on his stomach, resting and avoiding physical activities.    FUNCTIONAL LIMITATIONS: difficulty with usual activities such as walking more than half the store at [employer], walking, doing housework, folding towels on the bed, using the vacuum, getting up in the morning, sleeping, standing up to go to the bathroom at night, yardwork, stairs, dressing, bathing, bed mobility, transfers, lifting, bending, pulling, pushing, carrying, playing tennis and playing baseball with grandson.     PRECAUTIONS: Other: unable to drive due to vision  WEIGHT BEARING RESTRICTIONS: No  FALLS:  Has patient fallen in last 6 months? No  LIVING ENVIRONMENT: Lives with: lives with their spouse No concerns about getting around home safely.   OCCUPATION: retired from [employer], working for [employer], Social research officer, government. A lot of standing and walking on concrete.   LEISURE: used to play tennis, play baseball,   PLOF: Independent  PATIENT GOALS: "to get better enough so I don't have to endure limitations I have in simple things" "bending over and trying to stand back up is my worst thing"   OBJECTIVE  DIAGNOSTIC FINDINGS:  Lumbar CT report from 12/12/2021: CLINICAL DATA:  Lumbar spondylosis.  Right leg pain and numbness.   EXAM: CT LUMBAR SPINE WITHOUT CONTRAST   TECHNIQUE: Multidetector CT imaging of the lumbar spine was performed without intravenous contrast administration. Multiplanar CT image reconstructions were also generated.   RADIATION DOSE REDUCTION: This exam was performed according to the departmental dose-optimization program which includes automated exposure control, adjustment of the mA and/or kV according to patient size and/or use of iterative reconstruction technique.   COMPARISON:  Lumbar spine MRI 12/03/2021 from [facility]   FINDINGS: Segmentation: 5 lumbar type vertebrae.   Alignment: Mild lumbar dextroscoliosis. Unchanged trace retrolisthesis of L1 on L2, L2 on L3, and  L3 on L4.   Vertebrae: No fracture or suspicious osseous  lesion. Chronic degenerative endplate changes from T5-H7 associated with severe disc space narrowing and vacuum disc at each level. Moderate disc space narrowing at L1-2.   Paraspinal and other soft tissues: Aortic atherosclerosis.   Disc levels: Detailed level by level description of degenerative changes is deferred to the recent prior MRI, however there is lateral recess and neural foraminal stenosis throughout the lumbar spine including severe right-sided neural foraminal stenosis at L4-5 and L5-S1.   IMPRESSION: 1. Widespread advanced disc and facet degeneration in the lumbar spine as shown on recent MRI. Severe right neural foraminal stenosis at L4-5 and L5-S1. 2. Aortic Atherosclerosis (ICD10-I70.0).     Electronically Signed   By: Logan Bores M.D.   On: 12/14/2021 17:03  SELF- REPORTED FUNCTION FOTO score: 44/100 (lumbar spine questionnaire)  OBSERVATION/INSPECTION Posture Posture (seated): forward head, rounded shoulders, slumped in sitting.  Posture (standing): slight shift to right, flattening of lumbar curve.  Anthropometrics Tremor: none noted (pt reports hand tremor) Body composition: BMI 31, low muscle mass Muscle bulk: generally low overall Edema: none Functional Mobility Bed mobility: supine <> sit and rolling mod I due to increased time and effort, painful Transfers: sit <> stand mod I for increased time/effort/pain and use of B UE Gait: grossly WFL for household and short community ambulation. More detailed gait analysis deferred to later date as needed.   SPINE MOTION  LUMBAR SPINE AROM *Indicates pain Flexion: fingers just distal to B patellas, increased pain, no worse.  Extension: 50% reports it hurts a bit but nothing like bending forwards.  Side Flexion:   R distal patella, sudden sharp pain (central).   L proximal patella, painful  Rotation:  R 30% uncomfortable L 25% uncomfortable  NEUROLOGICAL Dermatomes L2-S2 appears equal and intact  to light touch. Deep Tendon Reflexes R/L  1+/1+ Quadriceps reflex (L4) 0+/0+ Achilles reflex (S1)   PERIPHERAL JOINT MOTION (in degrees)  PASSIVE RANGE OF MOTION (PROM) B LE appear grossly WNL for basic mobility.  MUSCLE PERFORMANCE (MMT):  *Indicates pain 02/11/22 Date Date  Joint/Motion R/L R/L R/L  Hip     Extension (knee flex) 4/4 / /  Abduction 4/4 / /  Knee     Extension (L3) 5/5 / /  Flexion (S2) 5/5 / /  Ankle/Foot     Dorsiflexion (L4) 5/5 / /  Great toe extension (L5) 5/5 / /  Eversion (S1) 5/5 / /  Plantarflexion (S1) 4/4 / /  Comments:  02/11/2022: able to toe walk with low clearance, able to heel walk WNL no UE support.   SPECIAL TESTS:  LOWER LIMB NEURODYNAMIC TESTS Straight Leg Raise (Sciatic nerve)  R  = positive for posterior thigh pain  L  = positive for posterior thigh pain  HIP SPECIAL TESTS ER derotation: R = negative, L = negative.  ACCESSORY MOTION: CPA with concordant pain worsening at lowest segments.   PALPATION: TTP over lumbar paraspinals, bilateral posterior glutes/hips.  Exquisite concordant TTP over bilateral greater trochanters, R > L.    REPEATED MOTIONS TESTING: Prone press up, discomfort in back, no worse.   FUNCTIONAL/BALANCE TESTS: 5 times sit to stand: 26 seconds from 18.5 inch plinth, no UE support until last two reps needed B UE on knees. Reports limitations from low back pain.    TODAY'S TREATMENT:  Education about dry needling. Education on diagnosis, prognosis, POC, anatomy and physiology of current condition.    PATIENT EDUCATION:  Education details: Education on diagnosis, prognosis, POC, anatomy and physiology of current condition, dry needling.  Person educated: Patient Education method: Explanation Education comprehension: verbalized understanding and needs further education  HOME  EXERCISE PROGRAM: TBD  ASSESSMENT:  CLINICAL IMPRESSION: Patient is a 76 y.o. male referred to outpatient physical therapy with a medical diagnosis of *** who presents with signs and symptoms consistent with ***. Patient presents with significant *** impairments that are limiting ability to complete *** without difficulty. Patient will benefit from skilled physical therapy intervention to address current body structure impairments and activity limitations to improve function and work towards goals set in current POC in order to return to prior level of function or maximal functional improvement.     OBJECTIVE IMPAIRMENTS: {opptimpairments:25111}.   ACTIVITY LIMITATIONS: {activitylimitations:27494}  PARTICIPATION LIMITATIONS: {participationrestrictions:25113}  PERSONAL FACTORS: {Personal factors:25162} are also affecting patient's functional outcome.   REHAB POTENTIAL: {rehabpotential:25112}  CLINICAL DECISION MAKING: {clinical decision making:25114}  EVALUATION COMPLEXITY: {Evaluation complexity:25115}   GOALS: Goals reviewed with patient? No  SHORT TERM GOALS: Target date: 02/25/2022  Patient will be independent with initial home exercise program for self-management of symptoms. Baseline: {HEPbaseline4:27310} (02/11/22); Goal status: {GOALSTATUS:25110}   LONG TERM GOALS: Target date: 05/06/2022  Patient will be independent with a long-term home exercise program for self-management of symptoms.  Baseline: {HEPbaseline4:27310} (02/11/22); Goal status: {GOALSTATUS:25110}  2.  Patient will demonstrate improved FOTO to equal or greater than *** by visit #*** to demonstrate improvement in overall condition and self-reported functional ability.  Baseline: *** (02/11/22); Goal status: {GOALSTATUS:25110}  3.  *** Baseline: *** (02/11/22); Goal status: {GOALSTATUS:25110}  4.  *** Baseline: *** (02/11/22); Goal status: {GOALSTATUS:25110}  5.  Patient will complete community,  work and/or recreational activities without limitation due to current condition.  Baseline: *** (02/11/22); Goal status: {GOALSTATUS:25110}  6.  *** Baseline: *** Goal status: {GOALSTATUS:25110}   PLAN:  PT FREQUENCY: {rehab frequency:25116}  PT DURATION: {rehab duration:25117}  PLANNED INTERVENTIONS: {rehab planned interventions:25118::"Therapeutic exercises","Therapeutic activity","Neuromuscular re-education","Balance training","Gait training","Patient/Family education","Self Care","Joint mobilization"}.  PLAN FOR NEXT SESSION: ***   Nancy Nordmann, PT 02/11/2022, 9:28 PM

## 2022-02-16 ENCOUNTER — Encounter: Payer: Self-pay | Admitting: Physical Therapy

## 2022-02-16 ENCOUNTER — Ambulatory Visit: Payer: Medicare HMO | Admitting: Physical Therapy

## 2022-02-16 VITALS — BP 113/80 | HR 42

## 2022-02-16 DIAGNOSIS — R262 Difficulty in walking, not elsewhere classified: Secondary | ICD-10-CM

## 2022-02-16 DIAGNOSIS — M5459 Other low back pain: Secondary | ICD-10-CM

## 2022-02-16 DIAGNOSIS — M6281 Muscle weakness (generalized): Secondary | ICD-10-CM

## 2022-02-16 DIAGNOSIS — M62838 Other muscle spasm: Secondary | ICD-10-CM

## 2022-02-16 NOTE — Therapy (Signed)
OUTPATIENT PHYSICAL THERAPY TREATMENT NOTE   Patient Name: Jeffrey [name] MRN: [id number] DOB:1945/10/08, 76 y.o., male Today's Date: 02/16/2022  PCP: [name], MD  REFERRING PROVIDER: Eustace Moore, MD   END OF SESSION:   PT End of Session - 02/16/22 1954     Visit Number 2    Number of Visits 24    Date for PT Re-Evaluation 05/06/22    Authorization Type [employer] MEDICARE reporting period from 02/11/2022    Progress Note Due on Visit 10    PT Start Time 1035    PT Stop Time 1115    PT Time Calculation (min) 40 min    Activity Tolerance Patient tolerated treatment well    Behavior During Therapy [facility] for tasks assessed/performed;Flat affect             Past Medical History:  Diagnosis Date   Anal fissure    Anemia    Anxiety    Arthritis    Barrett esophagus    BPH (benign prostatic hyperplasia)    Bruises easily    Cancer ([place])    skin cancer removed with MOHS   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 followed by neurology. 2 year follow up from now.   Chronic kidney disease    DDD (degenerative disc disease), lumbar    Depression    ED (erectile dysfunction)    Esophageal candidiasis [place] 09/2019   treated with fluconazole for 2 weeks.    GERD (gastroesophageal reflux disease)    Headache 10/2019   silent migraine causes vision problems   History of hiatal hernia    Hyperlipidemia    Hypertension    Lumbar radiculitis    Multiple thyroid nodules    Sleep apnea    does not use a cpap machine.    Tremors of nervous system    Vitiligo    Past Surgical History:  Procedure Laterality Date   APPENDECTOMY     CATARACT EXTRACTION W/PHACO Left 11/24/2021   Procedure: CATARACT EXTRACTION PHACO AND INTRAOCULAR LENS PLACEMENT ([place] LEFT TORIC LENS 7.32 01:01.2;  Surgeon: BLeandrew Koyanagi MD;  [place]  Service: Ophthalmology;  Laterality: Left;   cheek surgery Right    stainless steel in right cheek    COLONOSCOPY     COLONOSCOPY WITH PROPOFOL N/A 02/19/2017   Procedure: COLONOSCOPY WITH PROPOFOL;  Surgeon: Toledo, TBenay Pike MD;  Location: [facility] ENDOSCOPY;  Service: Gastroenterology;  Laterality: N/A;   COLONOSCOPY WITH PROPOFOL N/A 12/26/2021   Procedure: COLONOSCOPY WITH PROPOFOL;  Surgeon: RAnnamaria Helling DO;  [facility];  Service: Gastroenterology;  Laterality: N/A;   ESOPHAGOGASTRODUODENOSCOPY     ESOPHAGOGASTRODUODENOSCOPY (EGD) WITH PROPOFOL N/A 02/19/2017   Procedure: ESOPHAGOGASTRODUODENOSCOPY (EGD) WITH PROPOFOL;  Surgeon: Toledo, TBenay Pike MD;  Location: [facility] ENDOSCOPY;  Service: Gastroenterology;  Laterality: N/A;   ESOPHAGOGASTRODUODENOSCOPY (EGD) WITH PROPOFOL N/A 09/27/2019   Procedure: ESOPHAGOGASTRODUODENOSCOPY (EGD) WITH PROPOFOL;  Surgeon: Toledo, TBenay Pike MD;  Location: [facility] ENDOSCOPY;  Service: Gastroenterology;  Laterality: N/A;   FUNCTIONAL ENDOSCOPIC SINUS SURGERY     uvuloplasty done for OSA   HERNIA REPAIR     hiatal   JOINT REPLACEMENT Right 2012   THR   JOINT REPLACEMENT Bilateral    TKR   TOTAL HIP ARTHROPLASTY Left 07/09/2021   Procedure: TOTAL HIP ARTHROPLASTY ANTERIOR APPROACH;  Surgeon: AGaynelle Arabian MD;  Location: [facility];  Service: Orthopedics;  Laterality: Left;   Patient Active Problem List  Diagnosis Date Noted   OA (osteoarthritis) of hip 07/09/2021   Osteoarthritis of left hip 07/09/2021   Essential tremor 12/06/2013   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 male who presents to outpatient physical therapy with a referral for medical diagnosis  lumbar degenerative disc disease.  This patient's chief complaints consist of chronic low back pain and bilateral lateral hip pain and intermittent right leg numbness leading to the following functional deficits: difficulty with usual activities such as walking more than half the store at [employer], walking, doing housework, folding towels on the bed, using the vacuum, getting up in the morning, sleeping, standing up to go to the bathroom at night, yardwork, stairs, dressing, bathing, bed mobility, transfers, lifting, bending, pulling, pushing, carrying, playing tennis and playing baseball with grandson. 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 Patient denies hx of stroke, seizures, lung problems, heart problems, diabetes, unexplained weight loss, unexplained changes in bowel or bladder problems, unexplained stumbling or dropping things, and spinal surgery   PRECAUTIONS: unable to drive due to vision   SUBJECTIVE:                                                                                                                                                                                      SUBJECTIVE STATEMENT:  Patient reports he is feeling okay today. His lumbar spine is hurting some at 3/10 NPRS. He is still interested in dry needling today.     PAIN:  Are you having pain? NPRS 3/10 in the lumbar spine.    OBJECTIVE:  Vitals:   02/16/22 1041  BP: 113/80  Pulse: (!) 42  SpO2: 96%     TODAY'S TREATMENT:  Therapeutic exercise: to centralize symptoms and improve ROM, strength, muscular endurance, and activity tolerance required for successful completion of functional activities.  - hooklying bridge with wide stance, 1x7 with 5 second hold. Cuing for form.  - hooklying bridge with hip abduction against GTB in wide stance, 1x10 with 5 second hold. Cuing for form.  - Hooklying abdominal brace with alternating marching, 1x5 each side. Cuing for form and breathing.  - Education on HEP including handout   Manual therapy: to reduce pain and tissue tension, improve range of motion, neuromodulation, in order to promote improved ability to complete functional activities. PRONE with face in cradle and pillow under ankles:  - STM to bilateral posterior lower thoracic and lumbar musculature focusing on lumbar paraspinals.   Modality:  Dry needling performed to low back muscles to decrease pain and spasms along patient's lumbar and groin region with patient in prone utilizing 2 dry needle(s) .31m x 734mwith 4 sticks total, one on each side at approximately L3 and L4 multifidi Patient educated about the risks and benefits from therapy and verbally consents to treatment. Dry needling performed by SaEverlean AlstromSnGraylon GoodT, DPT who is certified in this technique.  Pt required multimodal cuing for proper technique and to facilitate improved neuromuscular control, strength, range of motion, and functional ability resulting in improved performance and form.    PATIENT EDUCATION:  Education details: Exercise purpose/form. Self management techniques. Education on diagnosis, prognosis, POC, anatomy and physiology of current condition. Education on HEP including handout.   dry needling.  Reviewed cancelation/no-show policy with patient and confirmed patient has correct phone number for clinic; patient verbalized understanding (02/16/22). Person educated: Patient Education method: Explanation, demonstration, tactile and verbal cues Education comprehension: verbalized understanding and needs further education   HOME EXERCISE PROGRAM: Access Code: [email or web address]: [email or web address] Date: 02/16/2022 Prepared by: SaRosita KeaExercises - Bridge with Hip Abduction and Resistance - Ground Touches  - 1-2 x daily - 1 sets - 20 reps - 5 seconds hold - Supine March  - 1-2 x daily - 3 sets - 10 reps   ASSESSMENT:   CLINICAL IMPRESSION: Patient arrived with similar pain compared to at initial eval and with continued interest in dry needling. Patient's heart rate noted to be in 40s which is tachycardic and he does report feeling sleepy often as well as being lightheaded upon standing at times. Encouraged him to discuss this with his doctor at next visit. Dry needling and STM performed to patient's low back. Patient with more tension and reactivity in the right sided muscles compared to left. Patient reported feeling improvement in pain following manual and dry needling and was able to tolerate exercises well. Patient introduced to initial lumbar/core and hip strengthening exercises that were provided in HEP. Plan to continue with progressive core, LE, and functional strengthening as tolerated and manual/dry needling as appropriate next session. Patient would benefit from continued management of limiting condition by skilled physical therapist to address remaining impairments and functional limitations to work towards stated goals and return to PLOF or maximal functional independence.   From PT initial Eval 04/13/2021:  Patient is a [age].o. male referred to outpatient physical therapy with a medical diagnosis of lumbar degenerative disc disease who presents with  signs and symptoms consistent with chronic low back pain with occasional right LE paresthesia and bilateral lateral hip pain that seems most consistent with greater trochanteric pain syndrome. Patient presentation suggests clinical instability of the lumbar spine, likely associated with  degenerative changes and would likely benefit from stabilization and strengthening exercises for the trunk and B LE to improve pain and functional activity tolerance. He likely has increased muscle tension in muscular response to stabilize spine. He also appears to have symptoms of bilateral trochanteric bursitis. His presentation currently does not confirm neurogenic claudication from spinal stenosis. Patient presents with significant pain, ROM, muscle performance (strength/power/endurance), paresthesia, muscle tension, motor control, gait, and activity tolerance impairments that are limiting ability to complete usual activities such as walking more than half the store at [employer], walking, doing housework, folding towels on the bed, using the vacuum, getting up in the morning, sleeping, standing up to go to the bathroom at night, yardwork, stairs, dressing, bathing, bed mobility, transfers, lifting, bending, pulling, pushing, carrying, playing tennis and playing baseball with grandson without difficulty. Patient will benefit from skilled physical therapy intervention to address current body structure impairments and activity limitations to improve function and work towards goals set in current POC in order to return to prior level of function or maximal functional improvement.      OBJECTIVE IMPAIRMENTS: Abnormal gait, decreased activity tolerance, decreased endurance, decreased knowledge of condition, decreased mobility, difficulty walking, decreased ROM, decreased strength, impaired perceived functional ability, increased muscle spasms, impaired flexibility, postural dysfunction, and pain.    ACTIVITY LIMITATIONS:  carrying, lifting, bending, sitting, standing, squatting, sleeping, stairs, transfers, bed mobility, dressing, hygiene/grooming, and locomotion level   PARTICIPATION LIMITATIONS: cleaning, laundry, interpersonal relationship, shopping, community activity, yard work, and   usual activities such as walking more than half the store at [employer], walking, doing housework, folding towels on the bed, using the vacuum, getting up in the morning, sleeping, standing up to go to the bathroom at night, yardwork, stairs, dressing, bathing, bed mobility, transfers, lifting, bending, pulling, pushing, carrying, playing tennis and playing baseball with grandson   PERSONAL FACTORS: Age, Fitness, Past/current experiences, Time since onset of injury/illness/exacerbation, and 3+ comorbidities:   cerebral aneurism (being monitored), tremors, sleep apnea (does not use CPAP), essential tremor in hands, urinary frequency, 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    REHAB POTENTIAL: Good   CLINICAL DECISION MAKING: Evolving/moderate complexity   EVALUATION COMPLEXITY: Moderate     GOALS: Goals reviewed with patient? No   SHORT TERM GOALS: Target date: 02/25/2022   Patient will be independent with initial home exercise program for self-management of symptoms. Baseline: Initial HEP to be provided at visit 2 as appropriate (02/11/22); Goal status: In-progress      LONG TERM GOALS: Target date: 05/06/2022   Patient will be independent with a long-term home exercise program for self-management of symptoms.  Baseline: Initial HEP to be provided at visit 2 as appropriate (02/11/22); Goal status: In-progress   2.  Patient will demonstrate improved FOTO to equal or greater than 50 by visit #12 to demonstrate improvement in overall condition and self-reported functional ability.  Baseline: 44 (02/11/22); Goal status: In-progress   3.  Patient will demonstrate ability to ambulate equal or greater than 1729 feet on 6  Minute Walk Test to match age-matched norm and improve community mobility.  Baseline: To be tested at visit 2 as appropriate (02/11/22); Goal status: In-progress   4.  Patient will complete 5 Times Sit To Stand Test in equal or less than 12.6 seconds from 18.5 inch plinth or lower to meet age matched norms and improve transfer mobility and activity tolerance. Baseline: 6 seconds from 18.5 inch plinth, no UE support until last two reps needed B UE on knees. Reports limitations from low back pain.  (02/11/22); Goal status: In-progress   5.  Patient will complete community, work and/or recreational activities without limitation due to current condition.  Baseline: difficulty with usual activities such as walking more than half the store at [employer], walking, doing housework, folding towels on the bed, using the vacuum, getting up in the morning, sleeping, standing up to go to the bathroom at night, yardwork, stairs, dressing, bathing, bed mobility, transfers, lifting, bending, pulling, pushing, carrying, playing tennis and playing baseball with grandson (02/11/22); Goal status: In-progress   6.  Patient will score level 1 on Sahrmann Core Stability Test to demonstrate improved core strength to decrease pain during functional activities such as doing yardwork.  Baseline: To be tested visit 2 as appropriate (02/11/22); Goal status: In-progress      PLAN:   PT FREQUENCY: 1-2x/week   PT DURATION: 12 weeks   PLANNED INTERVENTIONS: Therapeutic exercises, Therapeutic activity, Neuromuscular re-education, Balance training, Gait training, Patient/Family education, Self Care, Joint mobilization, Dry Needling, Electrical stimulation, Spinal mobilization, Cryotherapy, Moist heat, Manual therapy, and Re-evaluation.   PLAN FOR NEXT SESSION: update HEP as appropriate, dry needling as appropriate, progressive motor control, core/LE/functional strengthening as appropriate.    Nancy Nordmann, PT, DPT 02/16/2022, 8:03 PM  [facility] [address] [place], [place] [address] P: [phone] I F: [phone]

## 2022-02-18 ENCOUNTER — Ambulatory Visit: Payer: Medicare HMO | Admitting: Physical Therapy

## 2022-02-23 ENCOUNTER — Encounter: Payer: Medicare HMO | Admitting: Physical Therapy

## 2022-02-23 ENCOUNTER — Ambulatory Visit: Payer: Medicare HMO | Admitting: Physical Therapy

## 2022-02-26 ENCOUNTER — Ambulatory Visit: Payer: Medicare HMO | Admitting: Physical Therapy

## 2022-02-26 ENCOUNTER — Encounter: Payer: Self-pay | Admitting: Urology

## 2022-02-26 ENCOUNTER — Ambulatory Visit: Payer: Medicare HMO | Admitting: Urology

## 2022-02-26 ENCOUNTER — Other Ambulatory Visit: Payer: Self-pay | Admitting: Urology

## 2022-02-26 VITALS — BP 130/86 | HR 64 | Ht 73.0 in | Wt 230.0 lb

## 2022-02-26 DIAGNOSIS — N400 Enlarged prostate without lower urinary tract symptoms: Secondary | ICD-10-CM

## 2022-02-26 DIAGNOSIS — N3943 Post-void dribbling: Secondary | ICD-10-CM

## 2022-02-26 DIAGNOSIS — R3912 Poor urinary stream: Secondary | ICD-10-CM

## 2022-02-26 DIAGNOSIS — R3916 Straining to void: Secondary | ICD-10-CM | POA: Diagnosis not present

## 2022-02-26 DIAGNOSIS — R1030 Lower abdominal pain, unspecified: Secondary | ICD-10-CM

## 2022-02-26 DIAGNOSIS — R3913 Splitting of urinary stream: Secondary | ICD-10-CM

## 2022-02-26 DIAGNOSIS — N401 Enlarged prostate with lower urinary tract symptoms: Secondary | ICD-10-CM

## 2022-02-26 MED ORDER — SULFAMETHOXAZOLE-TRIMETHOPRIM 800-160 MG PO TABS
1.0000 | ORAL_TABLET | Freq: Once | ORAL | Status: AC
  Administered 2022-02-26: 1 via ORAL

## 2022-02-26 MED ORDER — LIDOCAINE HCL URETHRAL/MUCOSAL 2 % EX GEL
1.0000 | Freq: Once | CUTANEOUS | Status: AC
  Administered 2022-02-26: 1 via URETHRAL

## 2022-02-26 NOTE — Progress Notes (Signed)
Cystoscopy Procedure Note:  Indication:  Weak stream, urinary dribbling, split stream, lower abdominal pain.  History of UroLift with Dr. Yves Dill in July 2021.  Did not tolerate alpha blockers previously secondary to retrograde ejaculation.  He was recently prescribed silodosin by PCP but has not yet tried that medication.  PSA from October 2023 5.35, stable from prior, normal for age range, low PSA density based on 83 g prostate on recent CT.  Bactrim given for prophylaxis.  After informed consent and discussion of the procedure and its risks, Jeffrey [name] was positioned and prepped in the standard fashion. Cystoscopy was performed with a flexible cystoscope. The urethra, bladder neck and entire bladder was visualized in a standard fashion. The prostate was large with obstructing lateral lobes.The ureteral orifices were visualized in their normal location and orientation.  Mild bladder trabeculations, no suspicious lesions, no abnormalities on retroflexion.   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  Primary symptoms are weak stream, straining, dribbling, split stream, and some lower abdominal pain.  He is unwilling to accept retrograde ejaculation as side effects from any intervention.  Would not be a good candidate for HOLEP or TURP.  He was interested in meeting with interventional radiology to discuss prostatic artery embolization which I think is very reasonable.  I also discussed he could try the silodosin prescribed by PCP, but may have retrograde ejaculation.  Referral placed to IR to consider prostatic artery embolization RTC 6 months PVR  Nickolas Madrid, MD 02/26/2022

## 2022-03-02 ENCOUNTER — Encounter: Payer: Medicare HMO | Admitting: Physical Therapy

## 2022-03-04 ENCOUNTER — Encounter: Payer: Medicare HMO | Admitting: Physical Therapy

## 2022-03-09 ENCOUNTER — Encounter: Payer: Medicare HMO | Admitting: Physical Therapy

## 2022-03-12 ENCOUNTER — Encounter: Payer: Medicare HMO | Admitting: Physical Therapy

## 2022-03-17 ENCOUNTER — Encounter: Payer: Medicare HMO | Admitting: Physical Therapy

## 2022-03-19 ENCOUNTER — Encounter: Payer: Medicare HMO | Admitting: Physical Therapy

## 2022-03-20 ENCOUNTER — Ambulatory Visit
Admission: RE | Admit: 2022-03-20 | Discharge: 2022-03-20 | Disposition: A | Discharge status: Home / Self Care | Payer: Medicare HMO | Source: Ambulatory Visit | Attending: Urology | Admitting: Urology

## 2022-03-20 DIAGNOSIS — N400 Enlarged prostate without lower urinary tract symptoms: Secondary | ICD-10-CM

## 2022-03-20 HISTORY — PX: IR RADIOLOGIST EVAL & MGMT: IMG5224

## 2022-03-20 NOTE — Consult Note (Signed)
Chief Complaint: Patient was seen in consultation today for benign prostatic hyperplasia  Referring Physician(s): Sninsky,Brian C  History of Present Illness: Jeffrey [name] is a 76 y.o. male presenting via virtual telephone consultation at the gracious referral of Dr. Diamantina Providence.   He has a history of benign prostatic hyperplasia with lower urinary tract symptoms.  He underwent UroLigt by Dr. Yves Dill in July 2021, but has experienced persistent symptoms inclduing:    He is adverse to any invasive interventions due to side effect of retrograde ejaculation.  Remains sexually active.    IPSS-QoL Incomplete emptying:  4 Frequency:  4 Intermittency:  5 Urgency:  3 Weak stream:  5 Straining:  3 Nocturia: 4 QOL:  5  Score = 23  (severe)  No hematuria.   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 followed by neurology. 2 year follow up from now.   Chronic kidney disease    DDD (degenerative disc disease), lumbar    Depression    ED (erectile dysfunction)    Esophageal candidiasis ([place] 09/2019   treated with fluconazole for 2 weeks.    GERD (gastroesophageal reflux disease)    Headache 10/2019   silent migraine causes vision problems   History of hiatal hernia    Hyperlipidemia    Hypertension    Lumbar radiculitis    Multiple thyroid nodules    Sleep apnea    does not use a cpap machine.    Tremors of nervous system    Vitiligo     Past Surgical History:  Procedure Laterality Date   APPENDECTOMY     CATARACT EXTRACTION W/PHACO Left 11/24/2021   Procedure: CATARACT EXTRACTION PHACO AND INTRAOCULAR LENS PLACEMENT ([place] LEFT TORIC LENS 7.32 01:01.2;  Surgeon: BLeandrew Koyanagi MD;  [place]  Service:  Ophthalmology;  Laterality: Left;   cheek surgery Right    stainless steel in right cheek   COLONOSCOPY     COLONOSCOPY WITH PROPOFOL N/A 02/19/2017   Procedure: COLONOSCOPY WITH PROPOFOL;  Surgeon: Toledo, TBenay Pike MD;  Location: [facility];  Service: Gastroenterology;  Laterality: N/A;   COLONOSCOPY WITH PROPOFOL N/A 12/26/2021   Procedure: COLONOSCOPY WITH PROPOFOL;  Surgeon: RAnnamaria Helling DO;  [facility]  Service: Gastroenterology;  Laterality: N/A;   ESOPHAGOGASTRODUODENOSCOPY     ESOPHAGOGASTRODUODENOSCOPY (EGD) WITH PROPOFOL N/A 02/19/2017   Procedure: ESOPHAGOGASTRODUODENOSCOPY (EGD) WITH PROPOFOL;  Surgeon: Toledo, TBenay Pike MD;  Location: [facility];  Service: Gastroenterology;  Laterality: N/A;   ESOPHAGOGASTRODUODENOSCOPY (EGD) WITH PROPOFOL N/A 09/27/2019   Procedure: ESOPHAGOGASTRODUODENOSCOPY (EGD) WITH PROPOFOL;  Surgeon: Toledo, TBenay Pike MD;  Location: [facility];  Service: Gastroenterology;  Laterality: N/A;   FUNCTIONAL ENDOSCOPIC SINUS SURGERY     uvuloplasty done for OSA   HERNIA REPAIR     hiatal   JOINT REPLACEMENT Right 2012   THR   JOINT REPLACEMENT Bilateral    TKR   TOTAL HIP ARTHROPLASTY Left 07/09/2021   Procedure: TOTAL HIP ARTHROPLASTY ANTERIOR APPROACH;  Surgeon: AGaynelle Arabian MD;  [facility];  Service: Orthopedics;  Laterality: Left;    Allergies: Paroxetine hcl, Mirtazapine, Atorvastatin, Cefdinir, Ciprofloxacin, Doxazosin, and Fluoxetine  Medications:  Prior to Admission medications   Medication Sig Start Date End Date Taking? Authorizing Provider  amLODipine (NORVASC) 10 MG tablet Take 10 mg by mouth daily.  10/27/13   [provider]  Ascorbic Acid (VITAMIN C PO) Take by mouth daily.    [provider]  chlorthalidone (HYGROTON) 25 MG tablet Take 12.5 mg daily by mouth.    [provider]  clobetasol cream (TEMOVATE) 2.50 % Apply 1 application. topically daily as needed (Dry  skin on fingers).    [provider]  KLOR-CON M20 20 MEQ tablet Take 20 mEq by mouth 2 (two) times daily. 01/07/22   [provider]  lisinopril (PRINIVIL,ZESTRIL) 40 MG tablet Take 40 mg by mouth daily. 10/23/13   [provider]  Omega-3 Fatty Acids (FISH OIL PO) Take by mouth daily.    [provider]  propranolol (INDERAL) 20 MG tablet Take 20 mg by mouth 3 (three) times daily. 10/10/21   [provider]  silodosin (RAPAFLO) 8 MG CAPS capsule Take 1 capsule by mouth daily with breakfast. 02/09/22   [provider]  tadalafil (CIALIS) 5 MG tablet Take 1 tablet (5 mg total) by mouth daily. 02/05/22   Billey Co, MD     No family history on file.  Social History   Socioeconomic History   Marital status: Married    Spouse name: Wells Guiles   Number of children: 2   Years of education: college   Highest education level: Not on file  Occupational History   Occupation: Press photographer at a [employer]    Employer: RETIRED  Tobacco Use   Smoking status: Never    Passive exposure: Never   Smokeless tobacco: Never  Vaping Use   Vaping Use: Never used  Substance and Sexual Activity   Alcohol use: Not Currently    Comment: OCC.   Drug use: No   Sexual activity: Not Currently  Other Topics Concern   Not on file  Social History Narrative   Patient lives with just wife.   Social Determinants of Health   Financial Resource Strain: Not on file  Food Insecurity: Not on file  Transportation Needs: Not on file  Physical Activity: Not on file  Stress: Not on file  Social Connections: Not on file    Review of Systems: A 12 point ROS discussed and pertinent positives are indicated in the HPI above.  All other systems are negative.   Vital Signs: There were no vitals taken for this visit.  No physical examination was performed in lieu of virtual telephone clinic visit.  Imaging: MR Prostate 09/14/19 IMPRESSION: 1. Significant  artifact from the RIGHT hip prosthetic which adversely affects diffusion-weighted and T1 weighted imaging with a broad defect over the RIGHT gland. 2. No evidence of high-grade carcinoma within the peripheral zone on T2 weighted imaging. 3. Additional zone zone is enlarged with multiple capsulated nodule most consistent benign prostate hypertrophy.   CT AP 04/16/19    5.3 x 5.2 x 6.9 = 99.57 cc  Labs:  CBC: Recent Labs    06/26/21 1112 07/10/21 0315 10/30/21 1126  WBC 5.1 11.3* 5.9  HGB 14.9 12.2* 14.2  HCT 44.0 34.5* 44.1  PLT 158 140* 163    COAGS: No results for input(s): "INR", "APTT" in the last 8760 hours.  BMP: Recent Labs    06/26/21 1112 07/10/21 0315 10/30/21 1126  NA 138 135 141  K 3.3* 3.1* 3.5  CL 103 103 102  CO2  $'27 26 28  'Y$ GLUCOSE 122* 163* 152*  BUN 24* 15 17  CALCIUM 9.3 8.6* 9.5  CREATININE 0.89 0.82 1.15  GFRNONAA >60 >60 >60    LIVER FUNCTION TESTS: Recent Labs    06/26/21 1112 10/30/21 1126  BILITOT 0.7 1.1  AST 22 24  ALT 16 22  ALKPHOS 66 63  PROT 7.0 6.7  ALBUMIN 4.1 3.9    TUMOR MARKERS: PSA = 5.35 (01/12/22, [facility])   Assessment and Plan: 76 year old male with symptomatic benign prostatic hyperplasia desiring non-invasive treatment for improvement of lower urinary tract symptoms.    He has severe (IPSS-QoL score of 23) lower urinary tract symptoms.    We discussed risks, benefits, indications, and periprocedural expectations for prostate artery embolization.  He is amenable to proceed.  Plan for prostate artery embol[facility] at [facility].    Electronically Signed: Suzette Battiest, MD 03/20/2022, 9:45 AM   I spent a total of  60 Minutes  in virtual telephone clinical consultation, greater than 50% of which was counseling/coordinating care for symptomatic benign prostatic hyperplasia.

## 2022-03-26 ENCOUNTER — Encounter: Payer: Medicare HMO | Admitting: Physical Therapy

## 2022-03-30 ENCOUNTER — Encounter: Payer: Medicare HMO | Admitting: Physical Therapy

## 2022-04-02 ENCOUNTER — Telehealth (HOSPITAL_COMMUNITY): Payer: Self-pay | Admitting: Radiology

## 2022-04-02 ENCOUNTER — Encounter: Payer: Medicare HMO | Admitting: Physical Therapy

## 2022-04-02 ENCOUNTER — Other Ambulatory Visit (HOSPITAL_COMMUNITY): Payer: Self-pay | Admitting: Interventional Radiology

## 2022-04-02 DIAGNOSIS — N401 Enlarged prostate with lower urinary tract symptoms: Secondary | ICD-10-CM

## 2022-04-02 NOTE — Telephone Encounter (Signed)
Called pt to schedule PAE with Dr. Serafina Royals on 04/17/22 for an 11 am start time. Left message for pt to call me and let me know if he wants that appointment. JM

## 2022-04-08 ENCOUNTER — Encounter (HOSPITAL_COMMUNITY): Payer: Self-pay

## 2022-04-14 ENCOUNTER — Encounter: Payer: Medicare HMO | Admitting: Physical Therapy

## 2022-04-16 ENCOUNTER — Other Ambulatory Visit: Payer: Self-pay | Admitting: Radiology

## 2022-04-16 DIAGNOSIS — N401 Enlarged prostate with lower urinary tract symptoms: Secondary | ICD-10-CM

## 2022-04-16 MED ORDER — "NITROGLYCERIN NICU 2% OINTMENT ": TOPICAL_OINTMENT | Freq: Once | TRANSDERMAL | Status: AC

## 2022-04-16 MED ORDER — PREDNISONE 1 MG PO TABS: 20.0000 mg | ORAL_TABLET | ORAL | Status: AC

## 2022-04-16 MED ORDER — LIDOCAINE-PRILOCAINE 2.5-2.5 % EX CREA: TOPICAL_CREAM | Freq: Once | CUTANEOUS | Status: AC

## 2022-04-17 ENCOUNTER — Other Ambulatory Visit (HOSPITAL_COMMUNITY): Payer: Self-pay | Admitting: Interventional Radiology

## 2022-04-17 ENCOUNTER — Other Ambulatory Visit: Payer: Self-pay | Admitting: Physician Assistant

## 2022-04-17 ENCOUNTER — Other Ambulatory Visit: Payer: Self-pay

## 2022-04-17 ENCOUNTER — Encounter (HOSPITAL_COMMUNITY): Payer: Self-pay

## 2022-04-17 ENCOUNTER — Ambulatory Visit (HOSPITAL_COMMUNITY)
Admission: RE | Admit: 2022-04-17 | Discharge: 2022-04-17 | Disposition: A | Discharge status: Home / Self Care | Payer: Medicare HMO | Source: Ambulatory Visit | Attending: Interventional Radiology | Admitting: Interventional Radiology

## 2022-04-17 DIAGNOSIS — N400 Enlarged prostate without lower urinary tract symptoms: Secondary | ICD-10-CM | POA: Diagnosis present

## 2022-04-17 DIAGNOSIS — N401 Enlarged prostate with lower urinary tract symptoms: Secondary | ICD-10-CM

## 2022-04-17 HISTORY — PX: IR US GUIDE VASC ACCESS LEFT: IMG2389

## 2022-04-17 HISTORY — PX: IR EMBO ARTERIAL NOT HEMORR HEMANG INC GUIDE ROADMAPPING: IMG5448

## 2022-04-17 HISTORY — PX: IR CT SPINE LTD: IMG2387

## 2022-04-17 HISTORY — PX: IR ANGIOGRAM SELECTIVE EACH ADDITIONAL VESSEL: IMG667

## 2022-04-17 HISTORY — PX: IR ANGIOGRAM PELVIS SELECTIVE OR SUPRASELECTIVE: IMG661

## 2022-04-17 LAB — PROTIME-INR
INR: 1 (ref 0.8–1.2)
Prothrombin Time: 13.3 seconds (ref 11.4–15.2)

## 2022-04-17 LAB — BASIC METABOLIC PANEL
Anion gap: 9 (ref 5–15)
BUN: 18 mg/dL (ref 8–23)
CO2: 27 mmol/L (ref 22–32)
Calcium: 9.2 mg/dL (ref 8.9–10.3)
Chloride: 103 mmol/L (ref 98–111)
Creatinine, Ser: 0.9 mg/dL (ref 0.61–1.24)
GFR, Estimated: >60 mL/min (ref >60)
Glucose, Bld: 107 mg/dL — ABNORMAL HIGH (ref 70–99)
Potassium: 3.5 mmol/L (ref 3.5–5.1)
Sodium: 139 mmol/L (ref 135–145)

## 2022-04-17 LAB — CBC
HCT: 42.8 % (ref 39.0–52.0)
Hemoglobin: 14.9 g/dL (ref 13.0–17.0)
MCH: 31.1 pg (ref 26.0–34.0)
MCHC: 34.8 g/dL (ref 30.0–36.0)
MCV: 89.4 fL (ref 80.0–100.0)
Platelets: 147 10*3/uL — ABNORMAL LOW (ref 150–400)
RBC: 4.79 MIL/uL (ref 4.22–5.81)
RDW: 13.8 % (ref 11.5–15.5)
WBC: 5.7 10*3/uL (ref 4.0–10.5)
nRBC: 0 % (ref 0.0–0.2)

## 2022-04-17 MED ORDER — FENTANYL CITRATE (PF) 100 MCG/2ML IJ SOLN
INTRAMUSCULAR | Status: AC
  Filled 2022-04-17: qty 2

## 2022-04-17 MED ORDER — CLINDAMYCIN PHOSPHATE 900 MG/50ML IV SOLN
900.0000 mg | Freq: Once | INTRAVENOUS | Status: AC
  Administered 2022-04-17: 900 mg via INTRAVENOUS
  Filled 2022-04-17 (×2): qty 50

## 2022-04-17 MED ORDER — PREDNISONE 20 MG PO TABS
20.0000 mg | ORAL_TABLET | ORAL | Status: AC
  Administered 2022-04-17: 20 mg via ORAL
  Filled 2022-04-17: qty 1

## 2022-04-17 MED ORDER — MIDAZOLAM HCL 2 MG/2ML IJ SOLN
INTRAMUSCULAR | Status: AC | PRN
  Administered 2022-04-17 (×2): 1 mg via INTRAVENOUS
  Administered 2022-04-17 (×4): .5 mg via INTRAVENOUS

## 2022-04-17 MED ORDER — SOLIFENACIN SUCCINATE 5 MG PO TABS: 5.0000 mg | ORAL_TABLET | Freq: Every day | ORAL | 0 refills | Status: AC

## 2022-04-17 MED ORDER — CLINDAMYCIN PHOSPHATE 300 MG/50ML IV SOLN: INTRAVENOUS | Status: AC | PRN

## 2022-04-17 MED ORDER — CLINDAMYCIN HCL 300 MG PO CAPS: 300.0000 mg | ORAL_CAPSULE | Freq: Three times a day (TID) | ORAL | 0 refills | Status: AC

## 2022-04-17 MED ORDER — IODIXANOL 320 MG/ML IV SOLN
100.0000 mL | Freq: Once | INTRAVENOUS | Status: AC | PRN
  Administered 2022-04-17: 35 mL via INTRAVENOUS

## 2022-04-17 MED ORDER — FENTANYL CITRATE (PF) 100 MCG/2ML IJ SOLN
INTRAMUSCULAR | Status: AC | PRN
  Administered 2022-04-17 (×3): 25 ug via INTRAVENOUS
  Administered 2022-04-17: 50 ug via INTRAVENOUS
  Administered 2022-04-17: 25 ug via INTRAVENOUS
  Administered 2022-04-17: 50 ug via INTRAVENOUS
  Administered 2022-04-17 (×2): 25 ug via INTRAVENOUS

## 2022-04-17 MED ORDER — LIDOCAINE-PRILOCAINE 2.5-2.5 % EX CREA: TOPICAL_CREAM | Freq: Once | CUTANEOUS | Status: DC

## 2022-04-17 MED ORDER — PHENAZOPYRIDINE HCL 97.2 MG PO TABS: 97.0000 mg | ORAL_TABLET | Freq: Three times a day (TID) | ORAL | 0 refills | Status: AC

## 2022-04-17 MED ORDER — LIDOCAINE-PRILOCAINE 2.5-2.5 % EX CREA
TOPICAL_CREAM | CUTANEOUS | Status: AC
  Filled 2022-04-17: qty 5

## 2022-04-17 MED ORDER — LIDOCAINE HCL 1 % IJ SOLN
INTRAMUSCULAR | Status: AC
  Filled 2022-04-17: qty 20

## 2022-04-17 MED ORDER — LIDOCAINE-PRILOCAINE 2.5-2.5 % EX CREA
TOPICAL_CREAM | Freq: Once | CUTANEOUS | Status: AC
  Filled 2022-04-17 (×2): qty 5

## 2022-04-17 MED ORDER — NITROGLYCERIN 2 % TD OINT
TOPICAL_OINTMENT | Freq: Once | TRANSDERMAL | Status: AC
  Filled 2022-04-17: qty 30

## 2022-04-17 MED ORDER — HEPARIN SODIUM (PORCINE) 1000 UNIT/ML IJ SOLN
INTRAMUSCULAR | Status: AC
  Filled 2022-04-17: qty 10

## 2022-04-17 MED ORDER — IODIXANOL 320 MG/ML IV SOLN
100.0000 mL | Freq: Once | INTRAVENOUS | Status: AC | PRN
  Administered 2022-04-17: 40 mL via INTRAVENOUS

## 2022-04-17 MED ORDER — VERAPAMIL HCL 2.5 MG/ML IV SOLN
INTRAVENOUS | Status: AC
  Filled 2022-04-17: qty 2

## 2022-04-17 MED ORDER — MIDAZOLAM HCL 2 MG/2ML IJ SOLN
INTRAMUSCULAR | Status: AC
  Filled 2022-04-17: qty 2

## 2022-04-17 MED ORDER — GENTAMICIN SULFATE 40 MG/ML IJ SOLN: 5.0000 mg/kg | INTRAVENOUS | Status: DC

## 2022-04-17 MED ORDER — NITROGLYCERIN 1 MG/10 ML FOR IR/CATH LAB
INTRA_ARTERIAL | Status: AC
  Filled 2022-04-17: qty 10

## 2022-04-17 MED ORDER — SODIUM CHLORIDE 0.9 % IV SOLN: INTRAVENOUS | Status: DC

## 2022-04-17 NOTE — H&P (Signed)
Chief Complaint: Severe lower urinary tract symptoms  Referring Physician(s): Dr. Diamantina Providence  Supervising Physician: Ruthann Cancer  Patient Status: [facility] - Out-pt  History of Present Illness: Jeffrey [name] is a 77 y.o. male history of benign prostatic hyperplasia with lower urinary tract symptoms.    He underwent UroLigt by Dr. Yves Dill in July 2021 but continues to have severe lower urinary tract symptoms.  His prostate symptom score is a 23 = severe.  He is here today for prostate artery embolization.  He was evaluated via tele-medicine by Dr. Serafina Royals back on 03/20/22 and he feels he is a good candidate for the procedure.  He is NPO. No nausea/vomiting. No Fever/chills. ROS negative.   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 followed by neurology. 2 year follow up from now.   Chronic kidney disease    DDD (degenerative disc disease), lumbar    Depression    ED (erectile dysfunction)    Esophageal candidiasis ([place] 09/2019   treated with fluconazole for 2 weeks.    GERD (gastroesophageal reflux disease)    Headache 10/2019   silent migraine causes vision problems   History of hiatal hernia    Hyperlipidemia    Hypertension    Lumbar radiculitis    Multiple thyroid nodules    Sleep apnea    does not use a cpap machine.    Tremors of nervous system    Vitiligo     Past Surgical History:  Procedure Laterality Date   APPENDECTOMY     CATARACT EXTRACTION W/PHACO Left 11/24/2021   Procedure: CATARACT EXTRACTION PHACO AND INTRAOCULAR LENS PLACEMENT ([place] TOR[place] LENS 7.32 01:01.2;  Surgeon: BLeandrew Koyanagi MD;  [place]  Service: Ophthalmology;  Laterality: Left;   cheek surgery Right     stainless steel in right cheek   COLONOSCOPY     COLONOSCOPY WITH PROPOFOL N/A 02/19/2017   Procedure: COLONOSCOPY WITH PROPOFOL;  Surgeon: Toledo, TBenay Pike MD;  Location: [facility];  Service: Gastroenterology;  Laterality: N/A;   COLONOSCOPY WITH PROPOFOL N/A 12/26/2021   Procedure: COLONOSCOPY WITH PROPOFOL;  Surgeon: RAnnamaria Helling DO;  [facility];  Service: Gastroenterology;  Laterality: N/A;   ESOPHAGOGASTRODUODENOSCOPY     ESOPHAGOGASTRODUODENOSCOPY (EGD) WITH PROPOFOL N/A 02/19/2017   Procedure: ESOPHAGOGASTRODUODENOSCOPY (EGD) WITH PROPOFOL;  Surgeon: Toledo, TBenay Pike MD;  Location: [facility];  Service: Gastroenterology;  Laterality: N/A;   ESOPHAGOGASTRODUODENOSCOPY (EGD) WITH PROPOFOL N/A 09/27/2019   Procedure: ESOPHAGOGASTRODUODENOSCOPY (EGD) WITH PROPOFOL;  Surgeon: Toledo, TBenay Pike MD;  Location: [facility];  Service: Gastroenterology;  Laterality: N/A;   FUNCTIONAL ENDOSCOPIC SINUS SURGERY     uvuloplasty done for OSA   HERNIA REPAIR     hiatal   IR RADIOLOGIST EVAL & MGMT  03/20/2022   JOINT REPLACEMENT Right 2012   THR   JOINT REPLACEMENT Bilateral    TKR   TOTAL HIP ARTHROPLASTY Left 07/09/2021   Procedure: TOTAL HIP ARTHROPLASTY ANTERIOR APPROACH;  Surgeon: AGaynelle Arabian MD;  [facility];  Service: Orthopedics;  Laterality: Left;    Allergies: Paroxetine hcl, Mirtazapine, Atorvastatin, Cefdinir, Ciprofloxacin, Doxazosin, and Fluoxetine  Medications: Prior to  Admission medications   Medication Sig Start Date End Date Taking? Authorizing Provider  amLODipine (NORVASC) 10 MG tablet Take 10 mg by mouth daily.  10/27/13  Yes [provider]  aspirin-acetaminophen-caffeine (EXCEDRIN MIGRAINE) [phone] MG tablet Take 2 tablets by mouth every 6 (six) hours as needed for headache.   Yes [provider]  chlorthalidone (HYGROTON) 25 MG tablet Take 12.5 mg daily by mouth.   Yes [provider]   HYDROcodone-acetaminophen (NORCO/VICODIN) 5-325 MG tablet Take 0.5-1 tablets by mouth 2 (two) times daily as needed for moderate pain. 03/10/22  Yes [provider]  KLOR-CON M20 20 MEQ tablet Take 20 mEq by mouth 2 (two) times daily. 01/07/22  Yes [provider]  lisinopril (PRINIVIL,ZESTRIL) 40 MG tablet Take 40 mg by mouth daily. 10/23/13  Yes [provider]  Multiple Vitamin (MULTIVITAMIN WITH MINERALS) TABS tablet Take 1 tablet by mouth daily.   Yes [provider]  Multiple Vitamins-Minerals (PRESERVISION AREDS 2 PO) Take 1 capsule by mouth 2 (two) times daily.   Yes [provider]  omeprazole (PRILOSEC) 40 MG capsule Take 40 mg by mouth daily. 03/03/22  Yes [provider]  OVER THE COUNTER MEDICATION Take 1 tablet by mouth daily. Vision Max supplement   Yes [provider]  Polyvinyl Alcohol-Povidone (REFRESH OP) Place 1 drop into both eyes daily as needed (dry eyes).   Yes [provider]  propranolol (INDERAL) 20 MG tablet Take 20 mg by mouth daily as needed (anxiety). 10/10/21  Yes [provider]  tadalafil (CIALIS) 5 MG tablet Take 1 tablet (5 mg total) by mouth daily. 02/05/22  Yes Billey Co, MD     No family history on file.  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   Drug use: No   Sexual activity: Not Currently  Other Topics Concern   Not on file  Social History Narrative   Patient lives with just wife.   Social Determinants of Health   Financial  Resource Strain: Not on file  Food Insecurity: Not on file  Transportation Needs: Not on file  Physical Activity: Not on file  Stress: Not on file  Social Connections: Not on file     Review of Systems: A 12 point ROS discussed and pertinent positives are indicated in the HPI above.  All other systems are negative.  Review of Systems  Vital Signs: BP (!) 147/97   Pulse 68   Temp 97.8 F (36.6 C) (Temporal)   Resp 17   Ht '6\' 1"'$  (1.854 m)   Wt 230 lb (104.3 kg)   SpO2 95%   BMI 30.34 kg/m   Physical Exam Vitals reviewed.  Constitutional:      Appearance: Normal appearance.  HENT:     Head: Normocephalic and atraumatic.  Eyes:     Extraocular Movements: Extraocular movements intact.  Cardiovascular:     Rate and Rhythm: Normal rate and regular rhythm.  Pulmonary:  Effort: Pulmonary effort is normal. No respiratory distress.     Breath sounds: Normal breath sounds.  Abdominal:     General: There is no distension.     Palpations: Abdomen is soft.     Tenderness: There is no abdominal tenderness.  Musculoskeletal:        General: Normal range of motion.     Cervical back: Normal range of motion.  Skin:    General: Skin is warm and dry.  Neurological:     General: No focal deficit present.     Mental Status: He is alert and oriented to person, place, and time.     Comments: Intention tremor present  Psychiatric:        Mood and Affect: Mood normal.        Behavior: Behavior normal.        Thought Content: Thought content normal.        Judgment: Judgment normal.     Imaging: IR Radiologist Eval & Mgmt  Result Date: 03/20/2022 EXAM: NEW PATIENT OFFICE VISIT CHIEF COMPLAINT: See Epic note. HISTORY OF PRESENT ILLNESS: See Epic note. REVIEW OF SYSTEMS: See Epic note. PHYSICAL EXAMINATION: See Epic note. ASSESSMENT AND PLAN: See Epic note. Ruthann Cancer, MD [facility] Radiology Electronically Signed   By: Ruthann Cancer M.D.   On: 03/20/2022 13:52    Labs:  CBC: Recent Labs    06/26/21 1112 07/10/21 0315 10/30/21 1126  WBC 5.1 11.3* 5.9  HGB 14.9 12.2* 14.2  HCT 44.0 34.5* 44.1  PLT 158 140* 163    COAGS: No results for input(s): "INR", "APTT" in the last 8760 hours.  BMP: Recent Labs    06/26/21 1112 07/10/21 0315 10/30/21 1126  NA 138 135 141  K 3.3* 3.1* 3.5  CL 103 103 102  CO2 '27 26 28  '$ GLUCOSE 122* 163* 152*  BUN 24* 15 17  CALCIUM 9.3 8.6* 9.5  CREATININE 0.89 0.82 1.15  GFRNONAA >60 >60 >60    LIVER FUNCTION TESTS: Recent Labs    06/26/21 1112 10/30/21 1126  BILITOT 0.7 1.1  AST 22 24  ALT 16 22  ALKPHOS 66 63  PROT 7.0 6.7  ALBUMIN 4.1 3.9    TUMOR MARKERS: No results for input(s): "AFPTM", "CEA", "CA199", "CHROMGRNA" in the last 8760 hours.  Assessment and Plan:  Benign prostatic hyperplasia with  severe lower urinary tract symptoms. Prostate symptom score is a 23 = severe.  Will proceed with prostate artery embolization today by Dr. Serafina Royals.  The Risks and benefits of embolization were discussed with the patient including, but not limited to bleeding, infection, vascular injury, post operative pain, or contrast induced renal failure.  This procedure involves the use of X-rays and because of the nature of the planned procedure, it is possible that we will have prolonged use of X-ray fluoroscopy.  Potential radiation risks to you include (but are not limited to) the following: - A slightly elevated risk for cancer several years later in life. This risk is typically less than 0.5% percent. This risk is low in comparison to the normal incidence of human cancer, which is 33% for women and 50% for men according to the [place]. - Radiation induced injury can include skin redness, resembling a rash, tissue breakdown / ulcers and hair loss (which can be temporary or permanent).   The likelihood of either of these occurring depends on the  difficulty of the procedure and whether you  are sensitive to radiation due to previous procedures, disease, or genetic conditions.   IF your procedure requires a prolonged use of radiation, you will be notified and given written instructions for further action.  It is your responsibility to monitor the irradiated area for the 2 weeks following the procedure and to notify your physician if you are concerned that you have suffered a radiation induced injury.    All of the patient's questions were answered, patient is agreeable to proceed. Consent signed and in chart.  Thank you for allowing our service to participate in Jeffrey [name] 's care.  Electronically Signed: Murrell Redden, PA-C   04/17/2022, 9:37 AM      I spent a total of  25 Minutes in face to face in clinical consultation, greater than 50% of which was counseling/coordinating care for prostate artery embo.

## 2022-04-17 NOTE — Progress Notes (Signed)
Interventional Radiology Brief Note:  Spoke with pharmacy and Dr. Serafina Royals regarding prophylactic antibiotic for prostate artery embolization.  Decision made to proceed with Clindamycin '900mg'$  IV pre-procedure.   Will plan to d/c home on clindamycin '300mg'$  PO q 8 hrs x 7 days.  [facility] [place], patient's preferred pharmacy.   Brynda Greathouse, MS RD PA-C

## 2022-04-17 NOTE — Procedures (Signed)
Interventional Radiology Procedure Note  Procedure:  1) Pelvic angiogram 2) Embolization of right inferior prostatic artery  Findings: Please refer to procedural dictation for full description.  Replaced right prostatic artery arising from inferior epigastric.  Successful embolization of right inferior prostatic artery branch with 400 micron Hydropearls.  Unable to select right superior and left (arising from anterior division of left internal iliac) from radial approach with 165 cm catheter due to height and severe tortuosity of thoracoabdominal aorta and iliac vessels.    Due to procedure time accumulation, will reschedule for femoral approach for completion of embolization in the near future.  Complications: None immediate  Estimated Blood Loss: < 5 mL  Recommendations: TR band protocol. Plan for discharge home 1 hour after TR band removed. IR will arrange for follow up repeat embolization from femoral approach.   Ruthann Cancer, MD Pager: [phone]

## 2022-04-22 ENCOUNTER — Other Ambulatory Visit (HOSPITAL_COMMUNITY): Payer: Self-pay | Admitting: Interventional Radiology

## 2022-04-22 DIAGNOSIS — N401 Enlarged prostate with lower urinary tract symptoms: Secondary | ICD-10-CM

## 2022-04-23 ENCOUNTER — Encounter: Payer: Medicare HMO | Admitting: Physical Therapy

## 2022-04-23 ENCOUNTER — Other Ambulatory Visit: Payer: Self-pay | Admitting: Radiology

## 2022-04-23 DIAGNOSIS — N400 Enlarged prostate without lower urinary tract symptoms: Secondary | ICD-10-CM

## 2022-04-24 ENCOUNTER — Other Ambulatory Visit (HOSPITAL_COMMUNITY): Payer: Self-pay | Admitting: Physician Assistant

## 2022-04-26 NOTE — H&P (Signed)
Chief Complaint: BPH with chronic UTI. Patient presents for Prostate Artery Embolization  Referring Physician(s): Dr. Thayer Dallas  Supervising Physician: Ruthann Cancer  Patient Status: [facility] - Out-pt  History of Present Illness: Jeffrey [name] is a 77 y.o. male male outpatient. Known to IR. History of BPH with chronic UTI  with significant obstruction not improved s/p Urolift. Presented to IR on 1.5.23 for prostate artery embolization. Where the patient underwent successful particle embolization of the right inferior prostatic artery however due to vascular tortuosity embolization of the right superior and left prostatic arteries were unsuccessful. Patient presents for repeat prostate artery embolization utilizing the common femoral artery as access.  Currently without any significant complaints. Patient alert and laying in bed,calm. Denies any fevers, headache, chest pain, SOB, cough, abdominal pain, nausea, vomiting or bleeding. Return precautions and treatment recommendations and follow-up discussed with the patient  who is agreeable with the plan.    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 followed by neurology. 2 year follow up from now.   Chronic kidney disease    DDD (degenerative disc disease), lumbar    Depression    ED (erectile dysfunction)    Esophageal candidiasis ([facility] 09/2019   treated with fluconazole for 2 weeks.    GERD (gastroesophageal reflux disease)    Headache 10/2019   silent migraine causes vision problems   History of hiatal hernia    Hyperlipidemia    Hypertension    Lumbar radiculitis    Multiple thyroid nodules    Sleep apnea    does not use a cpap machine.    Tremors of nervous system    Vitiligo      Past Surgical History:  Procedure Laterality Date   APPENDECTOMY     CATARACT EXTRACTION W/PHACO Left 11/24/2021   Procedure: CATARACT EXTRACTION PHACO AND INTRAOCULAR LENS PLACEMENT [place] TOR[place] 7.32 01:01.2;  Surgeon: BLeandrew Koyanagi MD;  [facility]  Service: Ophthalmology;  Laterality: Left;   cheek surgery Right    stainless steel in right cheek   COLONOSCOPY     COLONOSCOPY WITH PROPOFOL N/A 02/19/2017   Procedure: COLONOSCOPY WITH PROPOFOL;  Surgeon: Toledo, TBenay Pike MD;  Location: [facility];  Service: Gastroenterology;  Laterality: N/A;   COLONOSCOPY WITH PROPOFOL N/A 12/26/2021   Procedure: COLONOSCOPY WITH PROPOFOL;  Surgeon: RAnnamaria Helling DO;  [facility];  Service: Gastroenterology;  Laterality: N/A;   ESOPHAGOGASTRODUODENOSCOPY     ESOPHAGOGASTRODUODENOSCOPY (EGD) WITH PROPOFOL N/A 02/19/2017   Procedure: ESOPHAGOGASTRODUODENOSCOPY (EGD) WITH PROPOFOL;  Surgeon: Toledo, TBenay Pike MD;  Location: [facility] END[facility]COPY;  Service: Gastroenterology;  Laterality: N/A;   ESOPHAGOGASTRODUODENOSCOPY (EGD) WITH PROPOFOL N/A 09/27/2019   Procedure: ESOPHAGOGASTRODUODENOSCOPY (EGD) WITH PROPOFOL;  Surgeon: Toledo, TBenay Pike MD;  Location: [facility] ENDOSCOPY;  Service: Gastroenterology;  Laterality: N/A;   FUNCTIONAL ENDOSCOPIC SINUS SURGERY     uvuloplasty done for OSA   HERNIA REPAIR     hiatal   IR ANGIOGRAM PELVIS SELECTIVE OR SUPRASELECTIVE  04/17/2022   IR ANGIOGRAM PELVIS SELECTIVE OR SUPRASELECTIVE  04/17/2022   IR ANGIOGRAM SELECTIVE EACH ADDITIONAL VESSEL  04/17/2022   IR ANGIOGRAM SELECTIVE EACH ADDITIONAL  VESSEL  04/17/2022   IR CT SPINE LTD  04/17/2022   IR EMBO ARTERIAL NOT HEMORR HEMANG INC GUIDE ROADMAPPING  04/17/2022   IR RADIOLOGIST EVAL & MGMT  03/20/2022   IR US GUIDE VASC ACCESS LEFT  04/17/2022   JOINT REPLACEMENT Right 2012   THR   JOINT REPLACEMENT Bilateral    TKR   TOTAL HIP ARTHROPLASTY Left 07/09/2021    Procedure: TOTAL HIP ARTHROPLASTY ANTERIOR APPROACH;  Surgeon: Gaynelle Arabian, MD;  Location: [facility];  Service: Orthopedics;  Laterality: Left;    Allergies: Paroxetine hcl, Mirtazapine, Atorvastatin, Cefdinir, Ciprofloxacin, Doxazosin, and Fluoxetine  Medications: Prior to Admission medications   Medication Sig Start Date End Date Taking? Authorizing Provider  amLODipine (NORVASC) 10 MG tablet Take 10 mg by mouth daily.  10/27/13   [provider]  aspirin-acetaminophen-caffeine (EXCEDRIN MIGRAINE) [phone] MG tablet Take 2 tablets by mouth every 6 (six) hours as needed for headache.    [provider]  chlorthalidone (HYGROTON) 25 MG tablet Take 12.5 mg daily by mouth.    [provider]  HYDROcodone-acetaminophen (NORCO/VICODIN) 5-325 MG tablet Take 0.5-1 tablets by mouth 2 (two) times daily as needed for moderate pain. 03/10/22   [provider]  KLOR-CON M20 20 MEQ tablet Take 20 mEq by mouth 2 (two) times daily. 01/07/22   [provider]  lisinopril (PRINIVIL,ZESTRIL) 40 MG tablet Take 40 mg by mouth daily. 10/23/13   [provider]  Multiple Vitamin (MULTIVITAMIN WITH MINERALS) TABS tablet Take 1 tablet by mouth daily.    [provider]  Multiple Vitamins-Minerals (PRESERVISION AREDS 2 PO) Take 1 capsule by mouth 2 (two) times daily.    [provider]  omeprazole (PRILOSEC) 40 MG capsule Take 40 mg by mouth daily. 03/03/22   [provider]  OVER THE COUNTER MEDICATION Take 1 tablet by mouth daily. Vision Max supplement    [provider]  Polyvinyl Alcohol-Povidone (REFRESH OP) Place 1 drop into both eyes daily as needed (dry eyes).    [provider]  propranolol (INDERAL) 20 MG tablet Take 20 mg by mouth daily as needed (anxiety). 10/10/21   [provider]  tadalafil (CIALIS) 5 MG tablet Take 1 tablet (5 mg total) by mouth daily. 02/05/22   Billey Co, MD     No  family history on file.  Social History   Socioeconomic History   Marital status: Married    Spouse name: Wells Guiles   Number of children: 2   Years of education: college   Highest education level: Not on file  Occupational History   Occupation: Press photographer at a [employer]    Employer: RETIRED  Tobacco Use   Smoking status: Never    Passive exposure: Never   Smokeless tobacco: Never  Vaping Use   Vaping Use: Never used  Substance and Sexual Activity   Alcohol use: Not Currently    Comment: OCC.   Drug use: No   Sexual activity: Not Currently  Other Topics Concern   Not on file  Social History Narrative   Patient lives with just wife.   Social Determinants of Health   Financial Resource Strain: Not on file  Food Insecurity: Not on file  Transportation Needs: Not on file  Physical Activity: Not on file  Stress: Not on file  Social Connections: Not on file    ECOG Status: {CHL ONC ECOG [id number]}  Review of Systems: A 12 point ROS discussed and pertinent positives are indicated  in the HPI above.  All other systems are negative.  Review of Systems  Vital Signs: There were no vitals taken for this visit.  Advance Care Plan: {Advance Care [id number]}    Physical Exam  Imaging: IR EMBO ARTERIAL NOT HEMORR HEMANG INC GUIDE ROADMAPPING  Result Date: 04/17/2022 INDICATION: 77 year old male with history of benign prostatic hyperplasia and severe lower urinary tract symptoms. EXAM: 1. Ultrasound-guided vascular access of the left radial artery. 2. Pelvic angiogram. 3. Selective catheterization and angiography of the right internal iliac artery, right inferior epigastric artery, right inferior prostatic artery, and common origin of the right superior prostatic artery and obturator collateral. 4. Selective catheterization and angiography of the left internal and external iliac arteries. 5. Cone beam CT. 6. Particle embolization of inferior right prostatic artery.  MEDICATIONS: Nine hundred mg clindamycin. The antibiotic was administered within 1 hour of the procedure ANESTHESIA/SEDATION: Moderate (conscious) sedation was employed during this procedure. A total of Versed 4 mg and Fentanyl 200 mcg was administered intravenously. Moderate Sedation Time: 169 minutes. The patient's level of consciousness and vital signs were monitored continuously by radiology nursing throughout the procedure under my direct supervision. CONTRAST:  44m VISIPAQUE IODIXANOL 320 MG/ML IV SOLN, 31mVISIPAQUE IODIXANOL 320 MG/ML IV SOLN FLUOROSCOPY: Radiation Exposure Index (as provided by the fluoroscopic device): 2,1,497Gy Kerma COMPLICATIONS: None immediate. PROCEDURE: Informed consent was obtained from the patient following explanation of the procedure, risks, benefits and alternatives. The patient understands, agrees and consents for the procedure. All questions were addressed. A time out was performed prior to the initiation of the procedure. Maximal barrier sterile technique utilized including caps, mask, sterile gowns, sterile gloves, large sterile drape, hand hygiene, and Betadine prep. The left wrist was prepped and draped in standard fashion. Pulse oximeter was attached to the left thumb. BaNickolas Madridest was performed, grade A. The left radial artery measured 0.5 cm in diameter. Subdermal Local anesthesia was provided at the planned needle entry site with 1% lidocaine. A small skin nick was made. Under direct ultrasound visualization, the left radial artery was punctured with a 21 gauge micropuncture needle. A permanent image was captured and stored in the record. A microwire was placed and exchanged for a 4/5 French slender sheath. The sheath was flushed followed by installation of standard radial cocktail. An 5 French, 135 cm MG2 catheter and Bentson wire were directed under fluoroscopic guidance through the left upper extremity, descending thoracic aorta, and into the infrarenal abdominal  aorta. Due to significant tortuosity of the thoracoabdominal aorta, the catheter reached only to the aortic bifurcation. Therefore, the catheter was exchanged over a 260 cm glidewire advantage for a 150 cm MG2. Catheter was directed under fluoroscopic guidance into the right internal iliac artery. Angiogram was performed. Two vesicular arterial branches were visualized in the pelvis, however otherwise the right hemi pelvis was void of arterial vascularity therefore, the catheter was directed under fluoroscopic guidance to the external iliac artery. External iliac angiogram was performed which demonstrated a prominent inferior epigastric artery with branches supplying the right obturator (corona mortis) and the prostate gland. Again, due to tortuosity, the catheter was unable to be advanced to the level of the inferior epigastric artery. In efforts to reduce tortuosity, the catheter and radial sheath were exchanged over a Glidewire Advantage for 119 cm 6 French Terumo destination sheath which was directed to the infrarenal abdominal aorta. Upon reinstertion of the 150 cm MG2, the left external iliac artery was naturally selected. Therefore, left external iliac  angiogram was performed to assess for anatomic symmetry. Again, the inferior epigastric artery was visualized to collateralized with the obturator (corona mortis) and multiple pelvic branches. The inferior epigastric artery was then selected with a 1.9 [place] Progreat lambda microcatheter in fathom 14 wire. Inferior epigastric/corona mortis angiogram was performed which demonstrated supply to the operator and multiple inferior rectal branches in addition to inferior vesicular branches. No evidence of prostatic artery contribution was visualized. Therefore, the 5 French catheter was retracted and directed to the level of the right external iliac artery under fluoroscopic guidance. Given reduction of tortuosity, the lambda microcatheter which was then able to  select the inferior epigastric artery. Angiogram was performed which demonstrated opacification of the obturator artery in addition to branches supplying the right hemi prostate. The inferior right prostatic artery was selected. Angiogram demonstrated opacification of the right hemi prostate without evidence of collateralization. Cone beam CT was then performed which confirmed opacification of the inferior aspect of the right hemi prostate. No evidence of non target opacification. Therefore, under fluoroscopic visualization, 400 micron Hydropearls were used for embolization. Approximately 1 cc was administered prior to achieving near stasis. The catheter was retracted and angiogram demonstrated complete embolization of the right inferior prostatic artery. The catheter was then retracted and the branch supplying the right superior prostatic artery was selected. Repeat angiogram again demonstrated opacification of the obturator artery in addition to a right vesicular branch and possibly a right inferior rectal branch. The catheter was then advanced until the coaxial system was completely hub and repeat angiogram was performed. Unfortunately, the catheter was unable to be passed beyond the vesicular branch. Given these limitations, attempted embolization of the superior prostatic artery was aborted at this time. The microcatheter was removed. A 5 French catheter was then retracted under fluoroscopic guidance and directed to the left internal iliac artery. Angiogram was then performed. The angiogram was significant for origination of the prostatic artery from the proximal internal pudendal artery which shares a common origin with the inferior gluteal artery. Additional attempts at selection of the left prostatic artery were made with multiple catheter and wire combinations. Unfortunately, due to tortuosity and catheter length limitations, this was unsuccessful. Given cumulative fluoroscopic dose and procedure time, the  procedure was aborted. The catheters were removed. The sheath was retracted into the left upper extremity. A TR band was applied in the sheath was removed. Hemostasis was achieved. The patient tolerated the procedure well and was transferred to the recovery area in good condition. IMPRESSION: 1. Extreme tortuosity of the thoracoabdominal aorta and iliac arteries. 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 There is left Yamaki type I anatomic configuration. 2. Successful particle embolization of the right inferior prostatic artery. 3. Unsuccessful attempt at embolization of the right superior and left prostatic arteries secondary to vascular tortuosity and radial access. PLAN: Plan to return in short order for repeat attempt at prostate artery embolization from common femoral artery access. Ruthann Cancer, MD [facility] Radiology Electronically Signed   By: Ruthann Cancer M.D.   On: 04/17/2022 20:04   IR US Guide Vasc Access Left  Result Date: 04/17/2022 INDICATION: 77 year old male with history of benign prostatic hyperplasia and severe lower urinary tract symptoms. EXAM: 1. Ultrasound-guided vascular access of the left radial artery. 2. Pelvic angiogram. 3. Selective catheterization and angiography of the right internal iliac artery, right inferior epigastric artery, right inferior prostatic artery, and common origin  of the right superior prostatic artery and obturator collateral. 4. Selective catheterization and angiography of the left internal and external iliac arteries. 5. Cone beam CT. 6. Particle embolization of inferior right prostatic artery. MEDICATIONS: Nine hundred mg clindamycin. The antibiotic was administered within 1 hour of the procedure ANESTHESIA/SEDATION: Moderate (conscious) sedation was employed during this procedure. A total of Versed 4 mg and Fentanyl 200  mcg was administered intravenously. Moderate Sedation Time: 169 minutes. The patient's level of consciousness and vital signs were monitored continuously by radiology nursing throughout the procedure under my direct supervision. CONTRAST:  25m VISIPAQUE IODIXANOL 320 MG/ML IV SOLN, 372mVISIPAQUE IODIXANOL 320 MG/ML IV SOLN FLUOROSCOPY: Radiation Exposure Index (as provided by the fluoroscopic device): 2,5,916Gy Kerma COMPLICATIONS: None immediate. PROCEDURE: Informed consent was obtained from the patient following explanation of the procedure, risks, benefits and alternatives. The patient understands, agrees and consents for the procedure. All questions were addressed. A time out was performed prior to the initiation of the procedure. Maximal barrier sterile technique utilized including caps, mask, sterile gowns, sterile gloves, large sterile drape, hand hygiene, and Betadine prep. The left wrist was prepped and draped in standard fashion. Pulse oximeter was attached to the left thumb. BaNickolas Madridest was performed, grade A. The left radial artery measured 0.5 cm in diameter. Subdermal Local anesthesia was provided at the planned needle entry site with 1% lidocaine. A small skin nick was made. Under direct ultrasound visualization, the left radial artery was punctured with a 21 gauge micropuncture needle. A permanent image was captured and stored in the record. A microwire was placed and exchanged for a 4/5 French slender sheath. The sheath was flushed followed by installation of standard radial cocktail. An 5 French, 135 cm MG2 catheter and Bentson wire were directed under fluoroscopic guidance through the left upper extremity, descending thoracic aorta, and into the infrarenal abdominal aorta. Due to significant tortuosity of the thoracoabdominal aorta, the catheter reached only to the aortic bifurcation. Therefore, the catheter was exchanged over a 260 cm glidewire advantage for a 150 cm MG2. Catheter was directed  under fluoroscopic guidance into the right internal iliac artery. Angiogram was performed. Two vesicular arterial branches were visualized in the pelvis, however otherwise the right hemi pelvis was void of arterial vascularity therefore, the catheter was directed under fluoroscopic guidance to the external iliac artery. External iliac angiogram was performed which demonstrated a prominent inferior epigastric artery with branches supplying the right obturator (corona mortis) and the prostate gland. Again, due to tortuosity, the catheter was unable to be advanced to the level of the inferior epigastric artery. In efforts to reduce tortuosity, the catheter and radial sheath were exchanged over a Glidewire Advantage for 119 cm 6 French Terumo destination sheath which was directed to the infrarenal abdominal aorta. Upon reinstertion of the 150 cm MG2, the left external iliac artery was naturally selected. Therefore, left external iliac angiogram was performed to assess for anatomic symmetry. Again, the inferior epigastric artery was visualized to collateralized with the obturator (corona mortis) and multiple pelvic branches. The inferior epigastric artery was then selected with a 1.9 [place] lambda microcatheter in fathom 14 wire. Inferior epigastric/corona mortis angiogram was performed which demonstrated supply to the operator and multiple inferior rectal branches in addition to inferior vesicular branches. No evidence of prostatic artery contribution was visualized. Therefore, the 5 French catheter was retracted and directed to the level of the right external iliac artery under fluoroscopic guidance. Given reduction of tortuosity, the lambda  microcatheter which was then able to select the inferior epigastric artery. Angiogram was performed which demonstrated opacification of the obturator artery in addition to branches supplying the right hemi prostate. The inferior right prostatic artery was selected.  Angiogram demonstrated opacification of the right hemi prostate without evidence of collateralization. Cone beam CT was then performed which confirmed opacification of the inferior aspect of the right hemi prostate. No evidence of non target opacification. Therefore, under fluoroscopic visualization, 400 micron Hydropearls were used for embolization. Approximately 1 cc was administered prior to achieving near stasis. The catheter was retracted and angiogram demonstrated complete embolization of the right inferior prostatic artery. The catheter was then retracted and the branch supplying the right superior prostatic artery was selected. Repeat angiogram again demonstrated opacification of the obturator artery in addition to a right vesicular branch and possibly a right inferior rectal branch. The catheter was then advanced until the coaxial system was completely hub and repeat angiogram was performed. Unfortunately, the catheter was unable to be passed beyond the vesicular branch. Given these limitations, attempted embolization of the superior prostatic artery was aborted at this time. The microcatheter was removed. A 5 French catheter was then retracted under fluoroscopic guidance and directed to the left internal iliac artery. Angiogram was then performed. The angiogram was significant for origination of the prostatic artery from the proximal internal pudendal artery which shares a common origin with the inferior gluteal artery. Additional attempts at selection of the left prostatic artery were made with multiple catheter and wire combinations. Unfortunately, due to tortuosity and catheter length limitations, this was unsuccessful. Given cumulative fluoroscopic dose and procedure time, the procedure was aborted. The catheters were removed. The sheath was retracted into the left upper extremity. A TR band was applied in the sheath was removed. Hemostasis was achieved. The patient tolerated the procedure well and was  transferred to the recovery area in good condition. IMPRESSION: 1. Extreme tortuosity of the thoracoabdominal aorta and iliac arteries. 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 There is left Yamaki type I anatomic configuration. 2. Successful particle embolization of the right inferior prostatic artery. 3. Unsuccessful attempt at embolization of the right superior and left prostatic arteries secondary to vascular tortuosity and radial access. PLAN: Plan to return in short order for repeat attempt at prostate artery embolization from common femoral artery access. Ruthann Cancer, MD [facility] Radiology Electronically Signed   By: Ruthann Cancer M.D.   On: 04/17/2022 20:04   IR Angiogram Pelvis Selective Or Supraselective  Result Date: 04/17/2022 INDICATION: 77 year old male with history of benign prostatic hyperplasia and severe lower urinary tract symptoms. EXAM: 1. Ultrasound-guided vascular access of the left radial artery. 2. Pelvic angiogram. 3. Selective catheterization and angiography of the right internal iliac artery, right inferior epigastric artery, right inferior prostatic artery, and common origin of the right superior prostatic artery and obturator collateral. 4. Selective catheterization and angiography of the left internal and external iliac arteries. 5. Cone beam CT. 6. Particle embolization of inferior right prostatic artery. MEDICATIONS: Nine hundred mg clindamycin. The antibiotic was administered within 1 hour of the procedure ANESTHESIA/SEDATION: Moderate (conscious) sedation was employed during this procedure. A total of Versed 4 mg and Fentanyl 200 mcg was administered intravenously. Moderate Sedation Time: 169 minutes. The patient's level of consciousness and vital signs were monitored continuously by radiology nursing throughout the procedure under my  direct supervision. CONTRAST:  40m VISIPAQUE  IODIXANOL 320 MG/ML IV SOLN, 71m VISIPAQUE IODIXANOL 320 MG/ML IV SOLN FLUOROSCOPY: Radiation Exposure Index (as provided by the fluoroscopic device): 27,902mGy Kerma COMPLICATIONS: None immediate. PROCEDURE: Informed consent was obtained from the patient following explanation of the procedure, risks, benefits and alternatives. The patient understands, agrees and consents for the procedure. All questions were addressed. A time out was performed prior to the initiation of the procedure. Maximal barrier sterile technique utilized including caps, mask, sterile gowns, sterile gloves, large sterile drape, hand hygiene, and Betadine prep. The left wrist was prepped and draped in standard fashion. Pulse oximeter was attached to the left thumb. BNickolas Madridtest was performed, grade A. The left radial artery measured 0.5 cm in diameter. Subdermal Local anesthesia was provided at the planned needle entry site with 1% lidocaine. A small skin nick was made. Under direct ultrasound visualization, the left radial artery was punctured with a 21 gauge micropuncture needle. A permanent image was captured and stored in the record. A microwire was placed and exchanged for a 4/5 French slender sheath. The sheath was flushed followed by installation of standard radial cocktail. An 5 French, 135 cm MG2 catheter and Bentson wire were directed under fluoroscopic guidance through the left upper extremity, descending thoracic aorta, and into the infrarenal abdominal aorta. Due to significant tortuosity of the thoracoabdominal aorta, the catheter reached only to the aortic bifurcation. Therefore, the catheter was exchanged over a 260 cm glidewire advantage for a 150 cm MG2. Catheter was directed under fluoroscopic guidance into the right internal iliac artery. Angiogram was performed. Two vesicular arterial branches were visualized in the pelvis, however otherwise the right hemi pelvis was void of  arterial vascularity therefore, the catheter was directed under fluoroscopic guidance to the external iliac artery. External iliac angiogram was performed which demonstrated a prominent inferior epigastric artery with branches supplying the right obturator (corona mortis) and the prostate gland. Again, due to tortuosity, the catheter was unable to be advanced to the level of the inferior epigastric artery. In efforts to reduce tortuosity, the catheter and radial sheath were exchanged over a Glidewire Advantage for 119 cm 6 French Terumo destination sheath which was directed to the infrarenal abdominal aorta. Upon reinstertion of the 150 cm MG2, the left external iliac artery was naturally selected. Therefore, left external iliac angiogram was performed to assess for anatomic symmetry. Again, the inferior epigastric artery was visualized to collateralized with the obturator (corona mortis) and multiple pelvic branches. The inferior epigastric artery was then selected with a 1.9 [place] lambda microcatheter in fathom 14 wire. Inferior epigastric/corona mortis angiogram was performed which demonstrated supply to the operator and multiple inferior rectal branches in addition to inferior vesicular branches. No evidence of prostatic artery contribution was visualized. Therefore, the 5 French catheter was retracted and directed to the level of the right external iliac artery under fluoroscopic guidance. Given reduction of tortuosity, the lambda microcatheter which was then able to select the inferior epigastric artery. Angiogram was performed which demonstrated opacification of the obturator artery in addition to branches supplying the right hemi prostate. The inferior right prostatic artery was selected. Angiogram demonstrated opacification of the right hemi prostate without evidence of collateralization. Cone beam CT was then performed which confirmed opacification of the inferior aspect of the right hemi  prostate. No evidence of non target opacification. Therefore, under fluoroscopic visualization, 400 micron Hydropearls were used for embolization. Approximately 1 cc was administered prior to achieving near stasis. The catheter was retracted and angiogram demonstrated  complete embolization of the right inferior prostatic artery. The catheter was then retracted and the branch supplying the right superior prostatic artery was selected. Repeat angiogram again demonstrated opacification of the obturator artery in addition to a right vesicular branch and possibly a right inferior rectal branch. The catheter was then advanced until the coaxial system was completely hub and repeat angiogram was performed. Unfortunately, the catheter was unable to be passed beyond the vesicular branch. Given these limitations, attempted embolization of the superior prostatic artery was aborted at this time. The microcatheter was removed. A 5 French catheter was then retracted under fluoroscopic guidance and directed to the left internal iliac artery. Angiogram was then performed. The angiogram was significant for origination of the prostatic artery from the proximal internal pudendal artery which shares a common origin with the inferior gluteal artery. Additional attempts at selection of the left prostatic artery were made with multiple catheter and wire combinations. Unfortunately, due to tortuosity and catheter length limitations, this was unsuccessful. Given cumulative fluoroscopic dose and procedure time, the procedure was aborted. The catheters were removed. The sheath was retracted into the left upper extremity. A TR band was applied in the sheath was removed. Hemostasis was achieved. The patient tolerated the procedure well and was transferred to the recovery area in good condition. IMPRESSION: 1. Extreme tortuosity of the thoracoabdominal aorta and iliac arteries. 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 There is left Yamaki type I anatomic configuration. 2. Successful particle embolization of the right inferior prostatic artery. 3. Unsuccessful attempt at embolization of the right superior and left prostatic arteries secondary to vascular tortuosity and radial access. PLAN: Plan to return in short order for repeat attempt at prostate artery embolization from common femoral artery access. Ruthann Cancer, MD [facility] Radiology Electronically Signed   By: Ruthann Cancer M.D.   On: 04/17/2022 20:04   IR Angiogram Pelvis Selective Or Supraselective  Result Date: 04/17/2022 INDICATION: 77 year old male with history of benign prostatic hyperplasia and severe lower urinary tract symptoms. EXAM: 1. Ultrasound-guided vascular access of the left radial artery. 2. Pelvic angiogram. 3. Selective catheterization and angiography of the right internal iliac artery, right inferior epigastric artery, right inferior prostatic artery, and common origin of the right superior prostatic artery and obturator collateral. 4. Selective catheterization and angiography of the left internal and external iliac arteries. 5. Cone beam CT. 6. Particle embolization of inferior right prostatic artery. MEDICATIONS: Nine hundred mg clindamycin. The antibiotic was administered within 1 hour of the procedure ANESTHESIA/SEDATION: Moderate (conscious) sedation was employed during this procedure. A total of Versed 4 mg and Fentanyl 200 mcg was administered intravenously. Moderate Sedation Time: 169 minutes. The patient's level of consciousness and vital signs were monitored continuously by radiology nursing throughout the procedure under my direct supervision. CONTRAST:  58m VISIPAQUE IODIXANOL 320 MG/ML IV SOLN, 388mVISIPAQUE IODIXANOL 320 MG/ML IV SOLN FLUOROSCOPY: Radiation Exposure Index (as provided by the fluoroscopic device): 2,1,610mGy Kerma COMPLICATIONS: None immediate. PROCEDURE: Informed consent was obtained from the patient following explanation of the procedure, risks, benefits and alternatives. The patient understands, agrees and consents for the procedure. All questions were addressed. A time out was performed prior to the initiation of the procedure. Maximal barrier sterile technique utilized including caps, mask, sterile gowns, sterile gloves, large sterile drape, hand hygiene, and Betadine prep. The left wrist was prepped and draped in standard fashion. Pulse oximeter  was attached to the left thumb. Nickolas Madrid test was performed, grade A. The left radial artery measured 0.5 cm in diameter. Subdermal Local anesthesia was provided at the planned needle entry site with 1% lidocaine. A small skin nick was made. Under direct ultrasound visualization, the left radial artery was punctured with a 21 gauge micropuncture needle. A permanent image was captured and stored in the record. A microwire was placed and exchanged for a 4/5 French slender sheath. The sheath was flushed followed by installation of standard radial cocktail. An 5 French, 135 cm MG2 catheter and Bentson wire were directed under fluoroscopic guidance through the left upper extremity, descending thoracic aorta, and into the infrarenal abdominal aorta. Due to significant tortuosity of the thoracoabdominal aorta, the catheter reached only to the aortic bifurcation. Therefore, the catheter was exchanged over a 260 cm glidewire advantage for a 150 cm MG2. Catheter was directed under fluoroscopic guidance into the right internal iliac artery. Angiogram was performed. Two vesicular arterial branches were visualized in the pelvis, however otherwise the right hemi pelvis was void of arterial vascularity therefore, the catheter was directed under fluoroscopic guidance to the external iliac artery. External iliac angiogram was performed which demonstrated a prominent inferior epigastric  artery with branches supplying the right obturator (corona mortis) and the prostate gland. Again, due to tortuosity, the catheter was unable to be advanced to the level of the inferior epigastric artery. In efforts to reduce tortuosity, the catheter and radial sheath were exchanged over a Glidewire Advantage for 119 cm 6 French Terumo destination sheath which was directed to the infrarenal abdominal aorta. Upon reinstertion of the 150 cm MG2, the left external iliac artery was naturally selected. Therefore, left external iliac angiogram was performed to assess for anatomic symmetry. Again, the inferior epigastric artery was visualized to collateralized with the obturator (corona mortis) and multiple pelvic branches. The inferior epigastric artery was then selected with a 1.9 [place] Progreat lambda microcatheter in fathom 14 wire. Inferior epigastric/corona mortis angiogram was performed which demonstrated supply to the operator and multiple inferior rectal branches in addition to inferior vesicular branches. No evidence of prostatic artery contribution was visualized. Therefore, the 5 French catheter was retracted and directed to the level of the right external iliac artery under fluoroscopic guidance. Given reduction of tortuosity, the lambda microcatheter which was then able to select the inferior epigastric artery. Angiogram was performed which demonstrated opacification of the obturator artery in addition to branches supplying the right hemi prostate. The inferior right prostatic artery was selected. Angiogram demonstrated opacification of the right hemi prostate without evidence of collateralization. Cone beam CT was then performed which confirmed opacification of the inferior aspect of the right hemi prostate. No evidence of non target opacification. Therefore, under fluoroscopic visualization, 400 micron Hydropearls were used for embolization. Approximately 1 cc was administered prior to achieving near  stasis. The catheter was retracted and angiogram demonstrated complete embolization of the right inferior prostatic artery. The catheter was then retracted and the branch supplying the right superior prostatic artery was selected. Repeat angiogram again demonstrated opacification of the obturator artery in addition to a right vesicular branch and possibly a right inferior rectal branch. The catheter was then advanced until the coaxial system was completely hub and repeat angiogram was performed. Unfortunately, the catheter was unable to be passed beyond the vesicular branch. Given these limitations, attempted embolization of the superior prostatic artery was aborted at this time. The microcatheter was removed. A 5 French catheter was then retracted  under fluoroscopic guidance and directed to the left internal iliac artery. Angiogram was then performed. The angiogram was significant for origination of the prostatic artery from the proximal internal pudendal artery which shares a common origin with the inferior gluteal artery. Additional attempts at selection of the left prostatic artery were made with multiple catheter and wire combinations. Unfortunately, due to tortuosity and catheter length limitations, this was unsuccessful. Given cumulative fluoroscopic dose and procedure time, the procedure was aborted. The catheters were removed. The sheath was retracted into the left upper extremity. A TR band was applied in the sheath was removed. Hemostasis was achieved. The patient tolerated the procedure well and was transferred to the recovery area in good condition. IMPRESSION: 1. Extreme tortuosity of the thoracoabdominal aorta and iliac arteries. 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 There is left Yamaki type I anatomic configuration. 2. Successful particle embolization of the right inferior prostatic artery. 3. Unsuccessful  attempt at embolization of the right superior and left prostatic arteries secondary to vascular tortuosity and radial access. PLAN: Plan to return in short order for repeat attempt at prostate artery embolization from common femoral artery access. Ruthann Cancer, MD [facility] Radiology Electronically Signed   By: Ruthann Cancer M.D.   On: 04/17/2022 20:04   IR CT Spine Ltd  Result Date: 04/17/2022 INDICATION: 77 year old male with history of benign prostatic hyperplasia and severe lower urinary tract symptoms. EXAM: 1. Ultrasound-guided vascular access of the left radial artery. 2. Pelvic angiogram. 3. Selective catheterization and angiography of the right internal iliac artery, right inferior epigastric artery, right inferior prostatic artery, and common origin of the right superior prostatic artery and obturator collateral. 4. Selective catheterization and angiography of the left internal and external iliac arteries. 5. Cone beam CT. 6. Particle embolization of inferior right prostatic artery. MEDICATIONS: Nine hundred mg clindamycin. The antibiotic was administered within 1 hour of the procedure ANESTHESIA/SEDATION: Moderate (conscious) sedation was employed during this procedure. A total of Versed 4 mg and Fentanyl 200 mcg was administered intravenously. Moderate Sedation Time: 169 minutes. The patient's level of consciousness and vital signs were monitored continuously by radiology nursing throughout the procedure under my direct supervision. CONTRAST:  33m VISIPAQUE IODIXANOL 320 MG/ML IV SOLN, 337mVISIPAQUE IODIXANOL 320 MG/ML IV SOLN FLUOROSCOPY: Radiation Exposure Index (as provided by the fluoroscopic device): 2,0,938Gy Kerma COMPLICATIONS: None immediate. PROCEDURE: Informed consent was obtained from the patient following explanation of the procedure, risks, benefits and alternatives. The patient understands, agrees and consents for the procedure. All  questions were addressed. A time out was performed prior to the initiation of the procedure. Maximal barrier sterile technique utilized including caps, mask, sterile gowns, sterile gloves, large sterile drape, hand hygiene, and Betadine prep. The left wrist was prepped and draped in standard fashion. Pulse oximeter was attached to the left thumb. BaNickolas Madridest was performed, grade A. The left radial artery measured 0.5 cm in diameter. Subdermal Local anesthesia was provided at the planned needle entry site with 1% lidocaine. A small skin nick was made. Under direct ultrasound visualization, the left radial artery was punctured with a 21 gauge micropuncture needle. A permanent image was captured and stored in the record. A microwire was placed and exchanged for a 4/5 French slender sheath. The sheath was flushed followed by installation of standard radial cocktail. An 5 French, 135 cm MG2 catheter and Bentson wire were directed under fluoroscopic  guidance through the left upper extremity, descending thoracic aorta, and into the infrarenal abdominal aorta. Due to significant tortuosity of the thoracoabdominal aorta, the catheter reached only to the aortic bifurcation. Therefore, the catheter was exchanged over a 260 cm glidewire advantage for a 150 cm MG2. Catheter was directed under fluoroscopic guidance into the right internal iliac artery. Angiogram was performed. Two vesicular arterial branches were visualized in the pelvis, however otherwise the right hemi pelvis was void of arterial vascularity therefore, the catheter was directed under fluoroscopic guidance to the external iliac artery. External iliac angiogram was performed which demonstrated a prominent inferior epigastric artery with branches supplying the right obturator (corona mortis) and the prostate gland. Again, due to tortuosity, the catheter was unable to be advanced to the level of the inferior epigastric artery. In efforts to reduce tortuosity, the  catheter and radial sheath were exchanged over a Glidewire Advantage for 119 cm 6 French Terumo destination sheath which was directed to the infrarenal abdominal aorta. Upon reinstertion of the 150 cm MG2, the left external iliac artery was naturally selected. Therefore, left external iliac angiogram was performed to assess for anatomic symmetry. Again, the inferior epigastric artery was visualized to collateralized with the obturator (corona mortis) and multiple pelvic branches. The inferior epigastric artery was then selected with a 1.9 [place] Progreat lambda microcatheter in fathom 14 wire. Inferior epigastric/corona mortis angiogram was performed which demonstrated supply to the operator and multiple inferior rectal branches in addition to inferior vesicular branches. No evidence of prostatic artery contribution was visualized. Therefore, the 5 French catheter was retracted and directed to the level of the right external iliac artery under fluoroscopic guidance. Given reduction of tortuosity, the lambda microcatheter which was then able to select the inferior epigastric artery. Angiogram was performed which demonstrated opacification of the obturator artery in addition to branches supplying the right hemi prostate. The inferior right prostatic artery was selected. Angiogram demonstrated opacification of the right hemi prostate without evidence of collateralization. Cone beam CT was then performed which confirmed opacification of the inferior aspect of the right hemi prostate. No evidence of non target opacification. Therefore, under fluoroscopic visualization, 400 micron Hydropearls were used for embolization. Approximately 1 cc was administered prior to achieving near stasis. The catheter was retracted and angiogram demonstrated complete embolization of the right inferior prostatic artery. The catheter was then retracted and the branch supplying the right superior prostatic artery was selected. Repeat angiogram  again demonstrated opacification of the obturator artery in addition to a right vesicular branch and possibly a right inferior rectal branch. The catheter was then advanced until the coaxial system was completely hub and repeat angiogram was performed. Unfortunately, the catheter was unable to be passed beyond the vesicular branch. Given these limitations, attempted embolization of the superior prostatic artery was aborted at this time. The microcatheter was removed. A 5 French catheter was then retracted under fluoroscopic guidance and directed to the left internal iliac artery. Angiogram was then performed. The angiogram was significant for origination of the prostatic artery from the proximal internal pudendal artery which shares a common origin with the inferior gluteal artery. Additional attempts at selection of the left prostatic artery were made with multiple catheter and wire combinations. Unfortunately, due to tortuosity and catheter length limitations, this was unsuccessful. Given cumulative fluoroscopic dose and procedure time, the procedure was aborted. The catheters were removed. The sheath was retracted into the left upper extremity. A TR band was applied in the sheath was removed. Hemostasis  was achieved. The patient tolerated the procedure well and was transferred to the recovery area in good condition. IMPRESSION: 1. Extreme tortuosity of the thoracoabdominal aorta and iliac arteries. 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 There is left Yamaki type I anatomic configuration. 2. Successful particle embolization of the right inferior prostatic artery. 3. Unsuccessful attempt at embolization of the right superior and left prostatic arteries secondary to vascular tortuosity and radial access. PLAN: Plan to return in short order for repeat attempt at prostate artery embolization from common femoral artery access.  Ruthann Cancer, MD [facility] Radiology Electronically Signed   By: Ruthann Cancer M.D.   On: 04/17/2022 20:04   IR Angiogram Selective Each Additional Vessel  Result Date: 04/17/2022 INDICATION: 77 year old male with history of benign prostatic hyperplasia and severe lower urinary tract symptoms. EXAM: 1. Ultrasound-guided vascular access of the left radial artery. 2. Pelvic angiogram. 3. Selective catheterization and angiography of the right internal iliac artery, right inferior epigastric artery, right inferior prostatic artery, and common origin of the right superior prostatic artery and obturator collateral. 4. Selective catheterization and angiography of the left internal and external iliac arteries. 5. Cone beam CT. 6. Particle embolization of inferior right prostatic artery. MEDICATIONS: Nine hundred mg clindamycin. The antibiotic was administered within 1 hour of the procedure ANESTHESIA/SEDATION: Moderate (conscious) sedation was employed during this procedure. A total of Versed 4 mg and Fentanyl 200 mcg was administered intravenously. Moderate Sedation Time: 169 minutes. The patient's level of consciousness and vital signs were monitored continuously by radiology nursing throughout the procedure under my direct supervision. CONTRAST:  63m VISIPAQUE IODIXANOL 320 MG/ML IV SOLN, 349mVISIPAQUE IODIXANOL 320 MG/ML IV SOLN FLUOROSCOPY: Radiation Exposure Index (as provided by the fluoroscopic device): 2,1,916Gy Kerma COMPLICATIONS: None immediate. PROCEDURE: Informed consent was obtained from the patient following explanation of the procedure, risks, benefits and alternatives. The patient understands, agrees and consents for the procedure. All questions were addressed. A time out was performed prior to the initiation of the procedure. Maximal barrier sterile technique utilized including caps, mask, sterile gowns, sterile gloves, large sterile drape, hand  hygiene, and Betadine prep. The left wrist was prepped and draped in standard fashion. Pulse oximeter was attached to the left thumb. BaNickolas Madridest was performed, grade A. The left radial artery measured 0.5 cm in diameter. Subdermal Local anesthesia was provided at the planned needle entry site with 1% lidocaine. A small skin nick was made. Under direct ultrasound visualization, the left radial artery was punctured with a 21 gauge micropuncture needle. A permanent image was captured and stored in the record. A microwire was placed and exchanged for a 4/5 French slender sheath. The sheath was flushed followed by installation of standard radial cocktail. An 5 French, 135 cm MG2 catheter and Bentson wire were directed under fluoroscopic guidance through the left upper extremity, descending thoracic aorta, and into the infrarenal abdominal aorta. Due to significant tortuosity of the thoracoabdominal aorta, the catheter reached only to the aortic bifurcation. Therefore, the catheter was exchanged over a 260 cm glidewire advantage for a 150 cm MG2. Catheter was directed under fluoroscopic guidance into the right internal iliac artery. Angiogram was performed. Two vesicular arterial branches were visualized in the pelvis, however otherwise the right hemi pelvis was void of arterial vascularity therefore, the catheter was directed under fluoroscopic guidance to the external iliac artery. External iliac angiogram was performed which demonstrated  a prominent inferior epigastric artery with branches supplying the right obturator (corona mortis) and the prostate gland. Again, due to tortuosity, the catheter was unable to be advanced to the level of the inferior epigastric artery. In efforts to reduce tortuosity, the catheter and radial sheath were exchanged over a Glidewire Advantage for 119 cm 6 French Terumo destination sheath which was directed to the infrarenal abdominal aorta. Upon reinstertion of the 150 cm MG2, the left  external iliac artery was naturally selected. Therefore, left external iliac angiogram was performed to assess for anatomic symmetry. Again, the inferior epigastric artery was visualized to collateralized with the obturator (corona mortis) and multiple pelvic branches. The inferior epigastric artery was then selected with a 1.9 [place] Progreat lambda microcatheter in fathom 14 wire. Inferior epigastric/corona mortis angiogram was performed which demonstrated supply to the operator and multiple inferior rectal branches in addition to inferior vesicular branches. No evidence of prostatic artery contribution was visualized. Therefore, the 5 French catheter was retracted and directed to the level of the right external iliac artery under fluoroscopic guidance. Given reduction of tortuosity, the lambda microcatheter which was then able to select the inferior epigastric artery. Angiogram was performed which demonstrated opacification of the obturator artery in addition to branches supplying the right hemi prostate. The inferior right prostatic artery was selected. Angiogram demonstrated opacification of the right hemi prostate without evidence of collateralization. Cone beam CT was then performed which confirmed opacification of the inferior aspect of the right hemi prostate. No evidence of non target opacification. Therefore, under fluoroscopic visualization, 400 micron Hydropearls were used for embolization. Approximately 1 cc was administered prior to achieving near stasis. The catheter was retracted and angiogram demonstrated complete embolization of the right inferior prostatic artery. The catheter was then retracted and the branch supplying the right superior prostatic artery was selected. Repeat angiogram again demonstrated opacification of the obturator artery in addition to a right vesicular branch and possibly a right inferior rectal branch. The catheter was then advanced until the coaxial system was completely  hub and repeat angiogram was performed. Unfortunately, the catheter was unable to be passed beyond the vesicular branch. Given these limitations, attempted embolization of the superior prostatic artery was aborted at this time. The microcatheter was removed. A 5 French catheter was then retracted under fluoroscopic guidance and directed to the left internal iliac artery. Angiogram was then performed. The angiogram was significant for origination of the prostatic artery from the proximal internal pudendal artery which shares a common origin with the inferior gluteal artery. Additional attempts at selection of the left prostatic artery were made with multiple catheter and wire combinations. Unfortunately, due to tortuosity and catheter length limitations, this was unsuccessful. Given cumulative fluoroscopic dose and procedure time, the procedure was aborted. The catheters were removed. The sheath was retracted into the left upper extremity. A TR band was applied in the sheath was removed. Hemostasis was achieved. The patient tolerated the procedure well and was transferred to the recovery area in good condition. IMPRESSION: 1. Extreme tortuosity of the thoracoabdominal aorta and iliac arteries. 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 There is left Yamaki type I anatomic configuration. 2. Successful particle embolization of the right inferior prostatic artery. 3. Unsuccessful attempt at embolization of the right superior and left prostatic arteries secondary to vascular tortuosity and radial access. PLAN: Plan to return in short order for repeat attempt at prostate artery embolization from common femoral artery  access. Ruthann Cancer, MD [facility] Electronically Signed   By: Ruthann Cancer M.D.   On: 04/17/2022 20:04   IR Angiogram Selective Each Additional  Vessel  Result Date: 04/17/2022 INDICATION: 77 year old male with history of benign prostatic hyperplasia and severe lower urinary tract symptoms. EXAM: 1. Ultrasound-guided vascular access of the left radial artery. 2. Pelvic angiogram. 3. Selective catheterization and angiography of the right internal iliac artery, right inferior epigastric artery, right inferior prostatic artery, and common origin of the right superior prostatic artery and obturator collateral. 4. Selective catheterization and angiography of the left internal and external iliac arteries. 5. Cone beam CT. 6. Particle embolization of inferior right prostatic artery. MEDICATIONS: Nine hundred mg clindamycin. The antibiotic was administered within 1 hour of the procedure ANESTHESIA/SEDATION: Moderate (conscious) sedation was employed during this procedure. A total of Versed 4 mg and Fentanyl 200 mcg was administered intravenously. Moderate Sedation Time: 169 minutes. The patient's level of consciousness and vital signs were monitored continuously by radiology nursing throughout the procedure under my direct supervision. CONTRAST:  42m VISIPAQUE IODIXANOL 320 MG/ML IV SOLN, 337mVISIPAQUE IODIXANOL 320 MG/ML IV SOLN FLUOROSCOPY: Radiation Exposure Index (as provided by the fluoroscopic device): 2,5,366Gy Kerma COMPLICATIONS: None immediate. PROCEDURE: Informed consent was obtained from the patient following explanation of the procedure, risks, benefits and alternatives. The patient understands, agrees and consents for the procedure. All questions were addressed. A time out was performed prior to the initiation of the procedure. Maximal barrier sterile technique utilized including caps, mask, sterile gowns, sterile gloves, large sterile drape, hand hygiene, and Betadine prep. The left wrist was prepped and draped in standard fashion. Pulse oximeter was attached to the left thumb. BaNickolas Madridest was performed, grade A. The left radial artery measured  0.5 cm in diameter. Subdermal Local anesthesia was provided at the planned needle entry site with 1% lidocaine. A small skin nick was made. Under direct ultrasound visualization, the left radial artery was punctured with a 21 gauge micropuncture needle. A permanent image was captured and stored in the record. A microwire was placed and exchanged for a 4/5 French slender sheath. The sheath was flushed followed by installation of standard radial cocktail. An 5 French, 135 cm MG2 catheter and Bentson wire were directed under fluoroscopic guidance through the left upper extremity, descending thoracic aorta, and into the infrarenal abdominal aorta. Due to significant tortuosity of the thoracoabdominal aorta, the catheter reached only to the aortic bifurcation. Therefore, the catheter was exchanged over a 260 cm glidewire advantage for a 150 cm MG2. Catheter was directed under fluoroscopic guidance into the right internal iliac artery. Angiogram was performed. Two vesicular arterial branches were visualized in the pelvis, however otherwise the right hemi pelvis was void of arterial vascularity therefore, the catheter was directed under fluoroscopic guidance to the external iliac artery. External iliac angiogram was performed which demonstrated a prominent inferior epigastric artery with branches supplying the right obturator (corona mortis) and the prostate gland. Again, due to tortuosity, the catheter was unable to be advanced to the level of the inferior epigastric artery. In efforts to reduce tortuosity, the catheter and radial sheath were exchanged over a Glidewire Advantage for 119 cm 6 French Terumo destination sheath which was directed to the infrarenal abdominal aorta. Upon reinstertion of the 150 cm MG2, the left external iliac artery was naturally selected. Therefore, left external iliac angiogram was performed to assess for anatomic symmetry. Again, the inferior epigastric artery was visualized  to  collateralized with the obturator (corona mortis) and multiple pelvic branches. The inferior epigastric artery was then selected with a 1.9 [place] Progreat lambda microcatheter in fathom 14 wire. Inferior epigastric/corona mortis angiogram was performed which demonstrated supply to the operator and multiple inferior rectal branches in addition to inferior vesicular branches. No evidence of prostatic artery contribution was visualized. Therefore, the 5 French catheter was retracted and directed to the level of the right external iliac artery under fluoroscopic guidance. Given reduction of tortuosity, the lambda microcatheter which was then able to select the inferior epigastric artery. Angiogram was performed which demonstrated opacification of the obturator artery in addition to branches supplying the right hemi prostate. The inferior right prostatic artery was selected. Angiogram demonstrated opacification of the right hemi prostate without evidence of collateralization. Cone beam CT was then performed which confirmed opacification of the inferior aspect of the right hemi prostate. No evidence of non target opacification. Therefore, under fluoroscopic visualization, 400 micron Hydropearls were used for embolization. Approximately 1 cc was administered prior to achieving near stasis. The catheter was retracted and angiogram demonstrated complete embolization of the right inferior prostatic artery. The catheter was then retracted and the branch supplying the right superior prostatic artery was selected. Repeat angiogram again demonstrated opacification of the obturator artery in addition to a right vesicular branch and possibly a right inferior rectal branch. The catheter was then advanced until the coaxial system was completely hub and repeat angiogram was performed. Unfortunately, the catheter was unable to be passed beyond the vesicular branch. Given these limitations, attempted embolization of the superior  prostatic artery was aborted at this time. The microcatheter was removed. A 5 French catheter was then retracted under fluoroscopic guidance and directed to the left internal iliac artery. Angiogram was then performed. The angiogram was significant for origination of the prostatic artery from the proximal internal pudendal artery which shares a common origin with the inferior gluteal artery. Additional attempts at selection of the left prostatic artery were made with multiple catheter and wire combinations. Unfortunately, due to tortuosity and catheter length limitations, this was unsuccessful. Given cumulative fluoroscopic dose and procedure time, the procedure was aborted. The catheters were removed. The sheath was retracted into the left upper extremity. A TR band was applied in the sheath was removed. Hemostasis was achieved. The patient tolerated the procedure well and was transferred to the recovery area in good condition. IMPRESSION: 1. Extreme tortuosity of the thoracoabdominal aorta and iliac arteries. 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 There is left Yamaki type I anatomic configuration. 2. Successful particle embolization of the right inferior prostatic artery. 3. Unsuccessful attempt at embolization of the right superior and left prostatic arteries secondary to vascular tortuosity and radial access. PLAN: Plan to return in short order for repeat attempt at prostate artery embolization from common femoral artery access. Ruthann Cancer, MD [facility] Radiology Electronically Signed   By: Ruthann Cancer M.D.   On: 04/17/2022 20:04    Labs:  CBC: Recent Labs    06/26/21 1112 07/10/21 0315 10/30/21 1126 04/17/22 0926  WBC 5.1 11.3* 5.9 5.7  HGB 14.9 12.2* 14.2 14.9  HCT 44.0 34.5* 44.1 42.8  PLT 158 140* 163 147*    COAGS: Recent Labs    04/17/22 0926   INR 1.0    BMP: Recent Labs    06/26/21 1112 07/10/21 0315 10/30/21 1126 04/17/22 0926  NA  138 135 141 139  K 3.3* 3.1* 3.5 3.5  CL 103 103 102 103  CO2 '27 26 28 27  '$ GLUCOSE 122* 163* 152* 107*  BUN 24* '15 17 18  '$ CALCIUM 9.3 8.6* 9.5 9.2  CREATININE 0.89 0.82 1.15 0.90  GFRNONAA >60 >60 >60 >60    LIVER FUNCTION TESTS: Recent Labs    06/26/21 1112 10/30/21 1126  BILITOT 0.7 1.1  AST 22 24  ALT 16 22  ALKPHOS 66 63  PROT 7.0 6.7  ALBUMIN 4.1 3.9     Assessment and Plan:  77 y.o. male outpatient. Known to IR. History of BPH with chronic UTI  with significant obstruction not improved s/p Urolift. Presented to IR on 1.5.23 for prostate artery embolization. Where the patient underwent successful particle embolization of the right inferior prostatic artery however due to vascular tortuosity embolization of the right superior and left prostatic arteries were unsuccessful. Patient presents for repeat prostate artery embolization utilizing the common femoral artery as access.   All labs and medications are within acceptable parameters. Allergies include cefdinir, cipro. Patient has been NPO since midnight.   The Risks and benefits of embolization were discussed with the patient including, but not limited to bleeding, infection, vascular injury, post operative pain, or contrast induced renal failure.  This procedure involves the use of X-rays and because of the nature of the planned procedure, it is possible that we will have prolonged use of X-ray fluoroscopy.  Potential radiation risks to you include (but are not limited to) the following: - A slightly elevated risk for cancer several years later in life. This risk is typically less than 0.5% percent. This risk is low in comparison to the normal incidence of human cancer, which is 33% for women and 50% for men according to the [place]. - Radiation induced injury can include skin redness, resembling a rash,  tissue breakdown / ulcers and hair loss (which can be temporary or permanent).   The likelihood of either of these occurring depends on the difficulty of the procedure and whether you are sensitive to radiation due to previous procedures, disease, or genetic conditions.   IF your procedure requires a prolonged use of radiation, you will be notified and given written instructions for further action.  It is your responsibility to monitor the irradiated area for the 2 weeks following the procedure and to notify your physician if you are concerned that you have suffered a radiation induced injury.    All of the patient's questions were answered, patient is agreeable to proceed. Consent signed and in chart.   Thank you for this interesting consult.  I greatly enjoyed meeting Jeffrey [name] and look forward to participating in their care.  A copy of this report was sent to the requesting provider on this date.  Electronically Signed: Jacqualine Mau, NP 04/26/2022, 5:56 PM   I spent a total of {New ZJQB:[id number]} {New Out-Pt:[id number]}  {Established Out-Pt:[id number]} in face to face in clinical consultation, greater than 50% of which was counseling/coordinating care for ***

## 2022-04-27 ENCOUNTER — Other Ambulatory Visit (HOSPITAL_COMMUNITY): Payer: Self-pay | Admitting: Interventional Radiology

## 2022-04-27 ENCOUNTER — Encounter (HOSPITAL_COMMUNITY): Payer: Self-pay | Admitting: Anesthesiology

## 2022-04-27 ENCOUNTER — Other Ambulatory Visit: Payer: Self-pay | Admitting: Radiology

## 2022-04-27 ENCOUNTER — Ambulatory Visit (HOSPITAL_COMMUNITY)
Admission: RE | Admit: 2022-04-27 | Discharge: 2022-04-27 | Disposition: A | Discharge status: Home / Self Care | Payer: Medicare HMO | Source: Ambulatory Visit | Attending: Interventional Radiology | Admitting: Interventional Radiology

## 2022-04-27 ENCOUNTER — Other Ambulatory Visit: Payer: Self-pay

## 2022-04-27 DIAGNOSIS — R338 Other retention of urine: Secondary | ICD-10-CM | POA: Insufficient documentation

## 2022-04-27 DIAGNOSIS — N401 Enlarged prostate with lower urinary tract symptoms: Secondary | ICD-10-CM

## 2022-04-27 DIAGNOSIS — N400 Enlarged prostate without lower urinary tract symptoms: Secondary | ICD-10-CM

## 2022-04-27 HISTORY — PX: IR ANGIOGRAM SELECTIVE EACH ADDITIONAL VESSEL: IMG667

## 2022-04-27 HISTORY — PX: IR ANGIOGRAM PELVIS SELECTIVE OR SUPRASELECTIVE: IMG661

## 2022-04-27 HISTORY — PX: IR EMBO ARTERIAL NOT HEMORR HEMANG INC GUIDE ROADMAPPING: IMG5448

## 2022-04-27 HISTORY — PX: IR US GUIDE VASC ACCESS RIGHT: IMG2390

## 2022-04-27 LAB — BASIC METABOLIC PANEL
Anion gap: 7 (ref 5–15)
BUN: 18 mg/dL (ref 8–23)
CO2: 26 mmol/L (ref 22–32)
Calcium: 9.1 mg/dL (ref 8.9–10.3)
Chloride: 104 mmol/L (ref 98–111)
Creatinine, Ser: 0.92 mg/dL (ref 0.61–1.24)
GFR, Estimated: >60 mL/min (ref >60)
Glucose, Bld: 104 mg/dL — ABNORMAL HIGH (ref 70–99)
Potassium: 3.2 mmol/L — ABNORMAL LOW (ref 3.5–5.1)
Sodium: 137 mmol/L (ref 135–145)

## 2022-04-27 LAB — PROTIME-INR
INR: 1.1 (ref 0.8–1.2)
Prothrombin Time: 14 seconds (ref 11.4–15.2)

## 2022-04-27 LAB — CBC
HCT: 41.7 % (ref 39.0–52.0)
Hemoglobin: 14.3 g/dL (ref 13.0–17.0)
MCH: 30.6 pg (ref 26.0–34.0)
MCHC: 34.3 g/dL (ref 30.0–36.0)
MCV: 89.3 fL (ref 80.0–100.0)
Platelets: 155 10*3/uL (ref 150–400)
RBC: 4.67 MIL/uL (ref 4.22–5.81)
RDW: 13.6 % (ref 11.5–15.5)
WBC: 5.5 10*3/uL (ref 4.0–10.5)
nRBC: 0 % (ref 0.0–0.2)

## 2022-04-27 MED ORDER — NITROGLYCERIN 1 MG/10 ML FOR IR/CATH LAB
INTRA_ARTERIAL | Status: AC | PRN
  Administered 2022-04-27 (×2): 200 ug via INTRA_ARTERIAL

## 2022-04-27 MED ORDER — LIDOCAINE-EPINEPHRINE 1 %-1:100000 IJ SOLN
INTRAMUSCULAR | Status: AC
  Administered 2022-04-27: 8 mL
  Filled 2022-04-27: qty 1

## 2022-04-27 MED ORDER — FENTANYL CITRATE (PF) 100 MCG/2ML IJ SOLN
INTRAMUSCULAR | Status: AC
  Filled 2022-04-27: qty 2

## 2022-04-27 MED ORDER — IOHEXOL 300 MG/ML  SOLN
150.0000 mL | Freq: Once | INTRAMUSCULAR | Status: AC | PRN
  Administered 2022-04-27: 100 mL via INTRA_ARTERIAL

## 2022-04-27 MED ORDER — MIDAZOLAM HCL 2 MG/2ML IJ SOLN
INTRAMUSCULAR | Status: AC
  Filled 2022-04-27: qty 2

## 2022-04-27 MED ORDER — CLINDAMYCIN HCL 300 MG PO CAPS: 300.0000 mg | ORAL_CAPSULE | Freq: Three times a day (TID) | ORAL | 0 refills | Status: AC

## 2022-04-27 MED ORDER — IOHEXOL 300 MG/ML  SOLN
100.0000 mL | Freq: Once | INTRAMUSCULAR | Status: AC | PRN
  Administered 2022-04-27: 1 mL via INTRA_ARTERIAL

## 2022-04-27 MED ORDER — IOHEXOL 300 MG/ML  SOLN
100.0000 mL | Freq: Once | INTRAMUSCULAR | Status: AC | PRN
  Administered 2022-04-27: 70 mL via INTRA_ARTERIAL

## 2022-04-27 MED ORDER — MIDAZOLAM HCL 2 MG/2ML IJ SOLN
INTRAMUSCULAR | Status: AC | PRN
  Administered 2022-04-27 (×2): 1 mg via INTRAVENOUS
  Administered 2022-04-27 (×2): .5 mg via INTRAVENOUS

## 2022-04-27 MED ORDER — FENTANYL CITRATE (PF) 100 MCG/2ML IJ SOLN
INTRAMUSCULAR | Status: AC | PRN
  Administered 2022-04-27: 25 ug via INTRAVENOUS
  Administered 2022-04-27: 50 ug via INTRAVENOUS
  Administered 2022-04-27: 25 ug via INTRAVENOUS
  Administered 2022-04-27: 50 ug via INTRAVENOUS

## 2022-04-27 MED ORDER — OXYCODONE-ACETAMINOPHEN 5-325 MG PO TABS
1.0000 | ORAL_TABLET | Freq: Once | ORAL | Status: AC
  Administered 2022-04-27: 1 via ORAL
  Filled 2022-04-27 (×2): qty 1

## 2022-04-27 MED ORDER — SOLIFENACIN SUCCINATE 5 MG PO TABS: 5.0000 mg | ORAL_TABLET | Freq: Every day | ORAL | 0 refills | Status: AC

## 2022-04-27 MED ORDER — PHENAZOPYRIDINE HCL 100 MG PO TABS: 100.0000 mg | ORAL_TABLET | Freq: Three times a day (TID) | ORAL | 0 refills | Status: AC | PRN

## 2022-04-27 MED ORDER — SODIUM CHLORIDE 0.9 % IV SOLN: INTRAVENOUS | Status: DC

## 2022-04-27 MED ORDER — CLINDAMYCIN PHOSPHATE 900 MG/50ML IV SOLN
900.0000 mg | INTRAVENOUS | Status: AC
  Administered 2022-04-27: 900 mg via INTRAVENOUS
  Filled 2022-04-27: qty 50

## 2022-04-27 MED ORDER — NITROGLYCERIN 1 MG/10 ML FOR IR/CATH LAB
INTRA_ARTERIAL | Status: AC
  Filled 2022-04-27: qty 10

## 2022-04-27 MED ORDER — IOHEXOL 300 MG/ML  SOLN
100.0000 mL | Freq: Once | INTRAMUSCULAR | Status: AC | PRN
  Administered 2022-04-27: 10 mL via INTRA_ARTERIAL

## 2022-04-27 MED ORDER — PREDNISONE 20 MG PO TABS
20.0000 mg | ORAL_TABLET | ORAL | Status: AC
  Administered 2022-04-27: 20 mg via ORAL
  Filled 2022-04-27: qty 1

## 2022-04-27 NOTE — Progress Notes (Signed)
Per patient, the foley cath will be placed while under anesthesia. Confirmed with Patty Rn/ radiology charge, foley will be placed by them.

## 2022-04-27 NOTE — Procedures (Signed)
Interventional Radiology Procedure Note  Procedure: Prostate artery embolization  Findings: Please refer to procedural dictation for full description. Particle and coil embolization of right superior and left prostatic arteries.  6 Fr right CFA Angioseal.  Complications: None immediate  Estimated Blood Loss:  < 5 mL  Recommendations: Strict 4 hour bedrest ( 2 hours flat, 2 hours head of bed up to 30 degrees). Foley out now. DC meds: -solifenacin 5 mg QD x 7 days  -phenazopyridine 100 mg TID x 7 days  -clinda PO 300 mg Q8H x 7 days    Ruthann Cancer, MD

## 2022-04-27 NOTE — Sedation Documentation (Signed)
6 fr angioseal deployed at 1349, site is level 0, dressing clean/dry/intact, pulses intact.

## 2022-05-01 ENCOUNTER — Ambulatory Visit (HOSPITAL_COMMUNITY): Payer: Medicare HMO

## 2022-05-21 ENCOUNTER — Other Ambulatory Visit: Payer: Self-pay | Admitting: Interventional Radiology

## 2022-05-21 DIAGNOSIS — N400 Enlarged prostate without lower urinary tract symptoms: Secondary | ICD-10-CM

## 2022-05-27 ENCOUNTER — Ambulatory Visit
Admission: RE | Admit: 2022-05-27 | Discharge: 2022-05-27 | Disposition: A | Discharge status: Home / Self Care | Payer: Medicare HMO | Source: Ambulatory Visit | Attending: Interventional Radiology | Admitting: Interventional Radiology

## 2022-05-27 DIAGNOSIS — N400 Enlarged prostate without lower urinary tract symptoms: Secondary | ICD-10-CM

## 2022-05-27 HISTORY — PX: IR RADIOLOGIST EVAL & MGMT: IMG5224

## 2022-05-27 NOTE — Progress Notes (Signed)
Referring Physician(s): Nickolas Madrid, MD  Chief Complaint: The patient is seen in virtual telephone follow up today s/p prostate artery embolization  History of present illness: 77 year old male with history of benign prostatic hyperplasia with lower urinary tract symptoms status post UroLift by Dr. Yves Dill in 2021, and now status post prostate artery embolizations on 04/17/22 and 04/27/22.  Initial procedure on 04/17/22 from left radial access was limited by severe tortuosity of his aorta and iliac vessels, with inability of our catheters/wires to reach the target prostatic vessels, also by variant anatomy of right prostatic artery supply arising via the corona mortis.  Only the right inferior prostatic artery was embolized during the first procedure.  On 04/27/22, from femoral artery approach, the remaining right superior, and left superior and inferior prostatic arteries with embolized with particles and coils.  He had an uneventful post-procedure course.  He reports no significant change and complaints of persistent "split stream" and nocturia (3-5x).  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 followed by neurology. 2 year follow up from now.   Chronic kidney disease    DDD (degenerative disc disease), lumbar    Depression    ED (erectile dysfunction)    [place] 09[place]   treated with fluconazole for 2 weeks.    GERD  (gastroesophageal reflux disease)    Headache 10/2019   silent migraine causes vision problems   History of hiatal hernia    Hyperlipidemia    Hypertension    Lumbar radiculitis    Multiple thyroid nodules    Sleep apnea    does not use a cpap machine.    Tremors of nervous system    Vitiligo     Past Surgical History:  Procedure Laterality Date   APPENDECTOMY     CATARACT EXTRACTION W/PHACO Left 11/24/2021   Procedure: CATARACT EXTRACTION PHACO AND INTRAOCULAR LENS PLACEMENT (IBier LEFT TORIC LENS 7.32 01:01.2;  Surgeon: BLeandrew Koyanagi MD;  [place] [facility]  Service: Ophthalmology;  Laterality: Left;   cheek surgery Right    stainless steel in right cheek   COLONOSCOPY     COLONOSCOPY WITH PROPOFOL N/A 02/19/2017   Procedure: COLONOSCOPY WITH PROPOFOL;  Surgeon: Toledo, TBenay Pike MD;  Location: [facility];  Service: Gastroenterology;  Laterality: N/A;   COLONOSCOPY WITH PROPOFOL N/A 12/26/2021   Procedure: COLONOSCOPY WITH PROPOFOL;  Surgeon: RAnnamaria Helling DO;  [facility];  Service: Gastroenterology;  Laterality: N/A;   ESOPHAGOGASTRODUODENOSCOPY     ESOPHAGOGASTRODUODENOSCOPY (EGD) WITH PROPOFOL N/A 02/19/2017   Procedure: ESOPHAGOGASTRODUODENOSCOPY (EGD) WITH PROPOFOL;  Surgeon: Toledo, TBenay Pike MD;  Location: [facility];  Service: Gastroenterology;  Laterality: N/A;   ESOPHAGOGASTRODUODENOSCOPY (EGD) WITH PROPOFOL N/A 09/27/2019   Procedure: ESOPHAGOGASTRODUODENOSCOPY (EGD) WITH  PROPOFOL;  Surgeon: Toledo, Benay Pike, MD;  Location: [facility];  Service: Gastroenterology;  Laterality: N/A;   FUNCTIONAL ENDOSCOPIC SINUS SURGERY     uvuloplasty done for OSA   HERNIA REPAIR     hiatal   IR ANGIOGRAM PELVIS SELECTIVE OR SUPRASELECTIVE  04/17/2022   IR ANGIOGRAM PELVIS SELECTIVE OR SUPRASELECTIVE  04/17/2022   IR ANGIOGRAM PELVIS SELECTIVE OR SUPRASELECTIVE  04/27/2022   IR ANGIOGRAM SELECTIVE EACH ADDITIONAL VESSEL  04/17/2022    IR ANGIOGRAM SELECTIVE EACH ADDITIONAL VESSEL  04/17/2022   IR ANGIOGRAM SELECTIVE EACH ADDITIONAL VESSEL  04/27/2022   IR ANGIOGRAM SELECTIVE EACH ADDITIONAL VESSEL  04/27/2022   IR CT SPINE LTD  04/17/2022   IR EMBO ARTERIAL NOT HEMORR HEMANG INC GUIDE ROADMAPPING  04/17/2022   IR EMBO ARTERIAL NOT HEMORR HEMANG INC GUIDE ROADMAPPING  04/27/2022   IR RADIOLOGIST EVAL & MGMT  03/20/2022   IR US GUIDE VASC ACCESS LEFT  04/17/2022   IR US GUIDE VASC ACCESS RIGHT  04/27/2022   JOINT REPLACEMENT Right 2012   THR   JOINT REPLACEMENT Bilateral    TKR   TOTAL HIP ARTHROPLASTY Left 07/09/2021   Procedure: TOTAL HIP ARTHROPLASTY ANTERIOR APPROACH;  Surgeon: Gaynelle Arabian, MD;  Location: [facility];  Service: Orthopedics;  Laterality: Left;    Allergies: Paroxetine hcl, Mirtazapine, Atorvastatin, Cefdinir, Ciprofloxacin, Doxazosin, and Fluoxetine  Medications: Prior to Admission medications   Medication Sig Start Date End Date Taking? Authorizing Provider  amLODipine (NORVASC) 10 MG tablet Take 10 mg by mouth daily.  10/27/13   [provider]  aspirin-acetaminophen-caffeine (EXCEDRIN MIGRAINE) [phone] MG tablet Take 2 tablets by mouth every 6 (six) hours as needed for headache.    [provider]  chlorthalidone (HYGROTON) 25 MG tablet Take 12.5 mg daily by mouth.    [provider]  HYDROcodone-acetaminophen (NORCO/VICODIN) 5-325 MG tablet Take 0.5-1 tablets by mouth 2 (two) times daily as needed for moderate pain. 03/10/22   [provider]  KLOR-CON M20 20 MEQ tablet Take 20 mEq by mouth 2 (two) times daily. 01/07/22   [provider]  lisinopril (PRINIVIL,ZESTRIL) 40 MG tablet Take 40 mg by mouth daily. 10/23/13   [provider]  Multiple Vitamin (MULTIVITAMIN WITH MINERALS) TABS tablet Take 1 tablet by mouth daily.    [provider]  Multiple Vitamins-Minerals (PRESERVISION AREDS 2 PO) Take 1 capsule by mouth 2 (two) times daily.    [provider]  omeprazole (PRILOSEC) 40 MG capsule Take 40 mg by mouth daily. 03/03/22   [provider]  OVER THE COUNTER MEDICATION Take 1 tablet by mouth daily. Vision Max supplement    [provider]  Polyvinyl Alcohol-Povidone (REFRESH OP) Place 1 drop into both eyes daily as needed (dry eyes).    [provider]  propranolol (INDERAL) 20 MG tablet Take 20 mg by mouth daily as needed (anxiety). 10/10/21   [provider]  tadalafil (CIALIS) 5 MG tablet Take 1 tablet (5 mg total) by mouth daily. 02/05/22   Billey Co, MD     No family history on file.  Social History   Socioeconomic History   Marital status: Married    Spouse name: Wells Guiles   Number of children: 2   Years of education: college   Highest education level: Not on file  Occupational History   Occupation: Press photographer at a [employer]    Employer: RETIRED  Tobacco Use   Smoking status: Never  Passive exposure: Never   Smokeless tobacco: Never  Vaping Use   Vaping Use: Never used  Substance and Sexual Activity   Alcohol use: Not Currently    Comment: OCC.   Drug use: No   Sexual activity: Not Currently  Other Topics Concern   Not on file  Social History Narrative   Patient lives with just wife.   Social Determinants of Health   Financial Resource Strain: Not on file  Food Insecurity: Not on file  Transportation Needs: Not on file  Physical Activity: Not on file  Stress: Not on file  Social Connections: Not on file     Vital Signs: There were no vitals taken for this visit.  No physical examination was performed in lieu of virtual telephone clinic visit.   Imaging: CT AP 04/16/19     5.3 x 5.2 x 6.9 = 99.57 cc    Labs:  CBC: Recent Labs    07/10/21 0315 10/30/21 1126 04/17/22 0926 04/27/22 0807  WBC 11.3* 5.9 5.7 5.5  HGB 12.2* 14.2 14.9 14.3  HCT 34.5* 44.1 42.8 41.7  PLT 140* 163 147* 155    COAGS: Recent Labs    04/17/22 0926  04/27/22 0807  INR 1.0 1.1    BMP: Recent Labs    07/10/21 0315 10/30/21 1126 04/17/22 0926 04/27/22 0807  NA 135 141 139 137  K 3.1* 3.5 3.5 3.2*  CL 103 102 103 104  CO2 26 28 27 26  $ GLUCOSE 163* 152* 107* 104*  BUN 15 17 18 18  $ CALCIUM 8.6* 9.5 9.2 9.1  CREATININE 0.82 1.15 0.90 0.92  GFRNONAA >60 >60 >60 >60    LIVER FUNCTION TESTS: Recent Labs    06/26/21 1112 10/30/21 1126  BILITOT 0.7 1.1  AST 22 24  ALT 16 22  ALKPHOS 66 63  PROT 7.0 6.7  ALBUMIN 4.1 3.9    Assessment and Plan: 77 year old male with symptomatic benign prostatic hyperplasia now status post prostate artery embolization (04/27/22) with persistent but improved IPSS-QoL (33-->23).  He is recovering well.  I expect his symptoms to continue to improve as the prostate continues to atrophy.  Plan for prostate MRI in 2 months, with clinic follow up once obtained.  Electronically Signed: Suzette Battiest 05/27/2022, 9:31 AM   I spent a total of 40 Minutes in virtual telephone clinical consultation, greater than 50% of which was counseling/coordinating care for benign prostatic hyperplasia.

## 2022-06-17 ENCOUNTER — Ambulatory Visit: Payer: Medicare HMO | Admitting: Urology

## 2022-06-19 ENCOUNTER — Other Ambulatory Visit: Payer: Self-pay | Admitting: Neurosurgery

## 2022-06-19 DIAGNOSIS — I671 Cerebral aneurysm, nonruptured: Secondary | ICD-10-CM

## 2022-06-25 ENCOUNTER — Ambulatory Visit
Admission: RE | Admit: 2022-06-25 | Discharge: 2022-06-25 | Disposition: A | Discharge status: Home / Self Care | Payer: Medicare HMO | Source: Ambulatory Visit | Attending: Neurosurgery | Admitting: Neurosurgery

## 2022-06-25 DIAGNOSIS — I671 Cerebral aneurysm, nonruptured: Secondary | ICD-10-CM

## 2022-07-01 ENCOUNTER — Ambulatory Visit: Payer: Medicare HMO | Admitting: Urology

## 2022-07-06 ENCOUNTER — Encounter: Payer: Self-pay | Admitting: Urology

## 2022-07-06 ENCOUNTER — Ambulatory Visit: Payer: Medicare HMO | Admitting: Urology

## 2022-07-06 VITALS — BP 138/83 | HR 86 | Ht 73.0 in | Wt 258.0 lb

## 2022-07-06 DIAGNOSIS — R102 Pelvic and perineal pain: Secondary | ICD-10-CM

## 2022-07-06 DIAGNOSIS — N400 Enlarged prostate without lower urinary tract symptoms: Secondary | ICD-10-CM

## 2022-07-06 DIAGNOSIS — N401 Enlarged prostate with lower urinary tract symptoms: Secondary | ICD-10-CM

## 2022-07-06 LAB — BLADDER SCAN AMB NON-IMAGING

## 2022-07-06 NOTE — Patient Instructions (Signed)
Nocturia refers to the need to wake up during the night to urinate, which can disrupt your sleep and impact your overall well-being. Fortunately, there are several strategies you can employ to help prevent or manage nocturia. It's important to consult with your healthcare provider before making any significant changes to your routine. Here are some helpful strategies to consider:  Limit Fluid Intake Before Bed: Avoid drinking large amounts of fluids in the evening, especially within a few hours of bedtime. Consume most of your daily fluid intake earlier in the day to reduce the need to urinate at night.  Monitor Your Diet: Limit your intake of caffeine and alcohol, as these substances can increase urine production and irritate the bladder.  Avoid diet, "zero calorie," and artificially sweetened drinks, especially sodas, in the afternoon or evening. Be mindful of consuming foods and drinks with high water content before bedtime, such as watermelon and herbal teas.  Time Your Medications: If you're taking medications that contribute to increased urination, consult your healthcare provider about adjusting the timing of these medications to minimize their impact during the night.  Practice Double Voiding: Before going to bed, make an effort to empty your bladder twice within a short period. This can help reduce the amount of urine left in your bladder before sleep.  Bladder Training: Gradually increase the time between bathroom visits during the day to train your bladder to hold larger volumes of urine. Over time, this can help reduce the frequency of nighttime awakenings to urinate.  Elevate Your Legs During the Day: Elevating your legs during the day can help minimize fluid retention in your lower extremities, which might reduce nighttime urination.  Pelvic Floor Exercises: Strengthening your pelvic floor muscles through Kegel exercises can help improve bladder control and potentially reduce  the urge to urinate at night.  Create a Relaxing Bedtime Routine: Stress and anxiety can exacerbate nocturia. Engage in calming activities before bed, such as reading, listening to soothing music, or practicing relaxation techniques.  Stay Active: Engage in regular physical activity, but avoid intense exercise close to bedtime, as this can increase your body's demand for fluids.  Maintain a Healthy Weight: Excess weight can compress the bladder and contribute to bladder and urinary issues. Aim to achieve and maintain a healthy weight through a balanced diet and regular exercise.  Remember that every individual is unique, and the effectiveness of these strategies may vary. It's important to work with your healthcare provider to develop a plan that suits your specific needs and addresses any underlying causes of nocturia.    Holmium Laser Enucleation of the Prostate (HoLEP)  HoLEP is a treatment for men with benign prostatic hyperplasia (BPH). The laser surgery removed blockages of urine flow, and is done without any incisions on the body.     What is HoLEP?  HoLEP is a type of laser surgery used to treat obstruction (blockage) of urine flow as a result of benign prostatic hyperplasia (BPH). In men with BPH, the prostate gland is not cancerous, but has become enlarged. An enlarged prostate can result in a number of urinary tract symptoms such as weak urinary stream, difficulty in starting urination, inability to urinate, frequent urination, or getting up at night to urinate.  HoLEP was developed in the 1990's as a more effective and less expensive surgical option for BPH, compared to other surgical options such as laser vaporization(PVP/greenlight laser), transurethral resection of the prostate(TURP), and open simple prostatectomy.   What happens during a HoLEP?  HoLEP requires general anesthesia ("asleep" throughout the procedure).   An antibiotic is given to reduce the risk of  infection  A surgical instrument called a resectoscope is inserted through the urethra (the tube that carries urine from the bladder). The resectoscope has a camera that allows the surgeon to view the internal structure of the prostate gland, and to see where the incisions are being made during surgery.  The laser is inserted into the resectoscope and is used to enucleate (free up) the enlarged prostate tissue from the capsule (outer shell) and then to seal up any blood vessels. The tissue that has been removed is pushed back into the bladder.  A morcellator is placed through the resectoscope, and is used to suction out the prostate tissue that has been pushed into the bladder.  When the prostate tissue has been removed, the resectoscope is removed, and a foley catheter is placed to allow healing and drain the urine from the bladder.     What happens after a HoLEP?  More than 90% of patients go home the same day a few hours after surgery. Less than 10% will be admitted to the hospital overnight for observation to monitor the urine, or if they have other medical problems.  Fluid is flushed through the catheter for about 1 hour after surgery to clear any blood from the urine. It is normal to have some blood in the urine after surgery. The need for blood transfusion is extremely rare.  Eating and drinking are permitted after the procedure once the patient has fully awakened from anesthesia.  The catheter is usually removed 2-3 days after surgery- the patient will come to clinic to have the catheter removed and make sure they can urinate on their own.  It is very important to drink lots of fluids after surgery for one week to keep the bladder flushed.  At first, there may be some burning with urination, but this typically improved within a few hours to days. Most patients do not have a significant amount of pain, and narcotic pain medications are rarely needed.  Symptoms of urinary frequency,  urgency, and even leakage are NORMAL for the first few weeks after surgery as the bladder adjusts after having to work hard against blockage from the prostate for many years. This will improve, but can sometimes take several months.  The use of pelvic floor exercises (Kegel exercises) can help improve problems with urinary incontinence.   After catheter removal, patients will be seen at 6 weeks and 6 months for symptom check  No heavy lifting for at least 2-3 weeks after surgery, however patients can walk and do light activities the first day after surgery. Return to work time depends on occupation.    What are the advantages of HoLEP?  HoLEP has been studied in many different parts of the world and has been shown to be a safe and effective procedure. Although there are many types of BPH surgeries available, HoLEP offers a unique advantage in being able to remove a large amount of tissue without any incisions on the body, even in very large prostates, while decreasing the risk of bleeding and providing tissue for pathology (to look for cancer). This decreases the need for blood transfusions during surgery, minimizes hospital stay, and reduces the risk of needing repeat treatment.  What are the side effects of HoLEP?  Temporary burning and bleeding during urination. Some blood may be seen in the urine for weeks after surgery and is part  of the healing process.  Urinary incontinence (inability to control urine flow) is expected in all patients immediately after surgery and they should wear pads for the first few days/weeks. This typically improves over the course of several weeks. Performing Kegel exercises can help decrease leakage from stress maneuvers such as coughing, sneezing, or lifting. The rate of long term leakage is very low. Patients may also have leakage with urgency and this may be treated with medication. The risk of urge incontinence can be dependent on several factors including age,  prostate size, symptoms, and other medical problems.  Retrograde ejaculation or "backwards ejaculation." In 75% of cases, the patient will not see any fluid during ejaculation after surgery.  Erectile function is generally not significantly affected.   What are the risks of HoLEP?  Injury to the urethra or development of scar tissue at a later date  Injury to the capsule of the prostate (typically treated with longer catheterization).  Injury to the bladder or ureteral orifices (where the urine from the kidney drains out)  Infection of the bladder, testes, or kidneys  Return of urinary obstruction at a later date requiring another operation (<2%)  Need for blood transfusion or re-operation due to bleeding  Failure to relieve all symptoms and/or need for prolonged catheterization after surgery  5-15% of patients are found to have previously undiagnosed prostate cancer in their specimen. Prostate cancer can be treated after HoLEP.  Standard risks of anesthesia including blood clots, heart attacks, etc  When should I call my doctor?  Fever over 101.3 degrees  Inability to urinate, or large blood clots in the urine

## 2022-07-06 NOTE — Progress Notes (Signed)
   07/06/2022 3:30 PM   Jeffrey [name] October 28, 1945 [id number]  Reason for visit: Follow up BPH/urinary symptoms, nocturia, ED  HPI: 77 year old male who was previously managed by Dr. Eliberto Ivory, and underwent a UroLift with him in 2021.  He denies that this improved his urinary symptoms at all.  When we originally met in October 2023 he reported a number of urinary symptoms including nocturia, genital pain, pain with ejaculations, weak/split urinary stream.  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    He underwent a cystoscopy in November 2023 that showed significantly enlarged prostate despite prior UroLift, prostate measured 83 g on a prior CT, PSA was 5.35, which was really stable from prior, reassuring PSA density, normal for his age.  He ultimately followed up with interventional radiology and opted for a prostatic artery embolization.  This was performed on 04/17/2022, and secondary to difficulties with access, a second procedure on 04/27/2022 was performed.  After the second procedure, his IPSS score improved to 23 from 33 preop, but he continues to report bothersome split stream and nocturia 3-5 times overnight.  Today he continues to be unhappy with his urinary symptoms.  PVR is mildly elevated at 141ml.  Primary urinary complaints are nocturia, postvoid dribbling, and sensation of incomplete emptying.  He continues to drink coffee in the morning, soda and tea in the afternoon, and tea in the evening.  We again reviewed behavioral strategies for his urinary symptoms including avoiding bladder irritants, weight loss, minimizing fluids before bedtime, and resuming CPAP for sleep apnea.  We discussed the correlation between sleep apnea and nocturia extensively.  We also discussed other alternatives like a trial of an alpha-blocker like silodosin which was prescribed by his PCP recently, or even considering HOLEP in the future if he continues  to be significantly bothered by his urinary symptoms despite trying the above strategies.  Finally, he had questions about amlodipine potentially causing nocturia, and I do think it would be reasonable to try different antihypertensive through PCP to see if his nocturia improves.  We reviewed nocturia is multifactorial and can be very challenging especially in older patients to completely resolve.  He takes Cialis 5 mg daily for ED, still having some problems with erections.  We discussed this could be increased to 10 mg daily which may improve some of his urinary symptoms, or you could increase his as needed boost dose to 10 to 15 mg 1 hour prior to sexual activity.  He had questions about low libido and testosterone, and we could check a testosterone in the future, but this would need to be checked in the morning before 10 AM.  He will think this over and make reconsider in follow-up.  -Continue Cialis, okay to increase to 10 mg daily, or increased to 10 to 15 mg boost dose as needed prior to sexual activity -Behavioral strategies discussed extensively regarding urinary symptoms and nocturia -Encouraged to resume CPAP for sleep apnea -Okay to trial Silodosin that was prescribed by PCP -RTC 3 months PVR and symptom check  I spent 30 total minutes on the day of the encounter including pre-visit review of the medical record, face-to-face time with the patient, and post visit ordering of labs/imaging/tests.   Billey Co, [facility] [address], [place], [place] [address] [phone]

## 2022-07-13 ENCOUNTER — Other Ambulatory Visit: Payer: Self-pay | Admitting: Interventional Radiology

## 2022-07-13 DIAGNOSIS — N401 Enlarged prostate with lower urinary tract symptoms: Secondary | ICD-10-CM

## 2022-07-30 ENCOUNTER — Inpatient Hospital Stay: Admission: RE | Admit: 2022-07-30 | Payer: Medicare HMO | Source: Ambulatory Visit

## 2022-08-04 ENCOUNTER — Ambulatory Visit
Admission: RE | Admit: 2022-08-04 | Discharge: 2022-08-04 | Disposition: A | Discharge status: Home / Self Care | Payer: Medicare HMO | Source: Ambulatory Visit | Attending: Interventional Radiology | Admitting: Interventional Radiology

## 2022-08-04 DIAGNOSIS — N401 Enlarged prostate with lower urinary tract symptoms: Secondary | ICD-10-CM

## 2022-08-07 ENCOUNTER — Other Ambulatory Visit: Payer: Medicare HMO

## 2022-08-12 ENCOUNTER — Ambulatory Visit
Admission: RE | Admit: 2022-08-12 | Discharge: 2022-08-12 | Disposition: A | Discharge status: Home / Self Care | Payer: Medicare HMO | Source: Ambulatory Visit | Attending: Interventional Radiology | Admitting: Interventional Radiology

## 2022-08-12 MED ORDER — GADOPICLENOL 0.5 MMOL/ML IV SOLN
10.0000 mL | Freq: Once | INTRAVENOUS | Status: AC | PRN
  Administered 2022-08-12: 10 mL via INTRAVENOUS

## 2022-08-14 ENCOUNTER — Encounter: Payer: Self-pay | Admitting: Gastroenterology

## 2022-08-16 NOTE — H&P (Signed)
Pre-Procedure H&P   Patient ID: Jeffrey [name] is a 77 y.o. male.  Gastroenterology Provider: Jaynie Collins, DO  Referring Provider: Jacob Moores, PA PCP: Dorothey Baseman, MD  Date: 08/17/2022  HPI Jeffrey [name] is a 77 y.o. male who presents today for Esophagogastroduodenoscopy for Surveillance-Barrett's esophagus without dysplasia .  Patient with longstanding history of acid reflux.  Last underwent EGD in June 2021 demonstrating short segment Barrett's esophagus (up to 2 cm) without dysplasia.  Candida was also noted at that time.  He underwent empiric dilation with 36 [place] Maloney- this did not improve his swallowing..  Acid reflux is well-controlled on PPI.  He has been gaining weight as of late.  Pt reports he has trouble with swallowing with solids like rice. Points to his upper throat area well above the sternoclavicular notch. No issues with liquids or pills. No n/v  hemoglobin 14.3 MCV 89 platelets 255,000 INR 1.1 creatinine 0.9  status post right hip replacement.  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 followed by neurology. 2 year follow up from now.   Chronic kidney disease    Chronic pain syndrome    DDD (degenerative disc disease), lumbar    Depression    ED (erectile dysfunction)    Esophageal candidiasis (HCC) 09/2019   treated with fluconazole for 2 weeks.    GERD (gastroesophageal reflux disease)    Headache 10/2019   silent migraine causes vision problems   History of hiatal hernia    Hyperlipidemia    Hypertension    Lumbar radiculitis    Multiple thyroid nodules    Sleep apnea    does not use a cpap machine.     Tremors of nervous system    Vitiligo     Past Surgical History:  Procedure Laterality Date   APPENDECTOMY     CATARACT EXTRACTION W/PHACO Left 11/24/2021   Procedure: CATARACT EXTRACTION PHACO AND INTRAOCULAR LENS PLACEMENT (IOC) LEFT TORIC LENS 7.32 01:01.2;  Surgeon: Lockie Mola, MD;  Location: [facility];  Service: Ophthalmology;  Laterality: Left;   cheek surgery Right    stainless steel in right cheek   COLONOSCOPY     COLONOSCOPY WITH PROPOFOL N/A 02/19/2017   Procedure: COLONOSCOPY WITH PROPOFOL;  Surgeon: Toledo, Boykin Nearing, MD;  Location: [facility];  Service: Gastroenterology;  Laterality: N/A;   COLONOSCOPY WITH PROPOFOL N/A 12/26/2021   Procedure: COLONOSCOPY WITH PROPOFOL;  Surgeon: Jaynie Collins, DO;  Location: [facility]COPY;  Service: Gastroenterology;  Laterality: N/A;   ESOPHAGOGASTRODUODENOSCOPY     ESOPHAGOGASTRODUODENOSCOPY (EGD) WITH PROPOFOL N/A 02/19/2017   Procedure: ESOPHAGOGASTRODUODENOSCOPY (EGD) WITH PROPOFOL;  Surgeon: Toledo, Boykin Nearing, MD;  Location: [facility] ENDOSCOPY;  Service: Gastroenterology;  Laterality: N/A;   ESOPHAGOGASTRODUODENOSCOPY (EGD) WITH PROPOFOL N/A 09/27/2019   Procedure: ESOPHAGOGASTRODUODENOSCOPY (EGD) WITH PROPOFOL;  Surgeon: Toledo, Boykin Nearing, MD;  Location: [facility] ENDOSCOPY;  Service: Gastroenterology;  Laterality: N/A;   FUNCTIONAL ENDOSCOPIC SINUS SURGERY     uvuloplasty done for OSA   HERNIA REPAIR     hiatal   IR ANGIOGRAM PELVIS SELECTIVE OR SUPRASELECTIVE  04/17/2022   IR ANGIOGRAM PELVIS SELECTIVE OR SUPRASELECTIVE  04/17/2022   IR ANGIOGRAM PELVIS SELECTIVE OR SUPRASELECTIVE  04/27/2022   IR ANGIOGRAM SELECTIVE EACH ADDITIONAL VESSEL  04/17/2022   IR ANGIOGRAM SELECTIVE EACH ADDITIONAL VESSEL  04/17/2022   IR ANGIOGRAM SELECTIVE EACH ADDITIONAL VESSEL  04/27/2022   IR ANGIOGRAM SELECTIVE EACH ADDITIONAL VESSEL  04/27/2022   IR CT SPINE LTD  04/17/2022   IR EMBO ARTERIAL NOT HEMORR HEMANG INC GUIDE ROADMAPPING   04/17/2022   IR EMBO ARTERIAL NOT HEMORR HEMANG INC GUIDE ROADMAPPING  04/27/2022   IR RADIOLOGIST EVAL & MGMT  03/20/2022   IR RADIOLOGIST EVAL & MGMT  05/27/2022   IR US GUIDE VASC ACCESS LEFT  04/17/2022   IR US GUIDE VASC ACCESS RIGHT  04/27/2022   JOINT REPLACEMENT Right 2012   THR   JOINT REPLACEMENT Bilateral    TKR   TOTAL HIP ARTHROPLASTY Left 07/09/2021   Procedure: TOTAL HIP ARTHROPLASTY ANTERIOR APPROACH;  Surgeon: Ollen Gross, MD;  Location: [facility];  Service: Orthopedics;  Laterality: Left;    Family History Brother-history of colon polyps No h/o GI disease or malignancy  Review of Systems  Constitutional:  Negative for activity change, appetite change, chills, diaphoresis, fatigue, fever and unexpected weight change.  HENT:  Positive for trouble swallowing. Negative for voice change.   Respiratory:  Negative for shortness of breath and wheezing.   Cardiovascular:  Negative for chest pain, palpitations and leg swelling.  Gastrointestinal:  Negative for abdominal distention, abdominal pain, anal bleeding, blood in stool, constipation, diarrhea, nausea and vomiting.  Musculoskeletal:  Negative for arthralgias and myalgias.  Skin:  Negative for color change and pallor.  Neurological:  Negative for dizziness, syncope and weakness.  Psychiatric/Behavioral:  Negative for confusion. The patient is not nervous/anxious.   All other systems reviewed and are negative.    Medications Current Facility-Administered Medications on File Prior to Encounter  Medication Dose Route Frequency Provider Last Rate Last Admin   lidocaine-prilocaine (EMLA) cream   Topical Once Ralene Muskrat, PA-C       nitroGLYCERIN NICU ointment 2%   Topical Once Ralene Muskrat, PA-C       Current Outpatient Medications on File Prior to Encounter  Medication Sig Dispense Refill   amLODipine (NORVASC) 10 MG tablet Take 10 mg by mouth daily.      chlorthalidone (HYGROTON) 25 MG tablet Take 12.5 mg daily by  mouth.     lisinopril (PRINIVIL,ZESTRIL) 40 MG tablet Take 40 mg by mouth daily.     aspirin-acetaminophen-caffeine (EXCEDRIN MIGRAINE) 250-250-65 MG tablet Take 2 tablets by mouth every 6 (six) hours as needed for headache.     HYDROcodone-acetaminophen (NORCO/VICODIN) 5-325 MG tablet Take 0.5-1 tablets by mouth 2 (two) times daily as needed for moderate pain.     KLOR-CON M20 20 MEQ tablet Take 20 mEq by mouth 2 (two) times daily.     Multiple Vitamin (MULTIVITAMIN WITH MINERALS) TABS tablet Take 1 tablet by mouth daily.     Multiple Vitamins-Minerals (PRESERVISION AREDS 2 PO) Take 1 capsule by mouth 2 (two) times daily.     omeprazole (PRILOSEC) 40 MG capsule Take 40 mg by mouth daily.     OVER THE COUNTER MEDICATION Take 1 tablet by mouth daily. Vision Max supplement     Polyvinyl Alcohol-Povidone (REFRESH OP) Place 1 drop into both eyes daily as needed (dry eyes).     propranolol (INDERAL) 20 MG tablet Take 20 mg by mouth daily as needed (  anxiety).      Pertinent medications related to GI and procedure were reviewed by me with the patient prior to the procedure   Current Facility-Administered Medications:    0.9 %  sodium chloride infusion, , Intravenous, Continuous, Jaynie Collins, DO, Last Rate: 20 mL/hr at 08/17/22 1219, New Bag at 08/17/22 1219  Facility-Administered Medications Ordered in Other Encounters:    lidocaine-prilocaine (EMLA) cream, , Topical, Once, Ralene Muskrat, PA-C   nitroGLYCERIN NICU ointment 2%, , Topical, Once, Ralene Muskrat, PA-C  sodium chloride 20 mL/hr at 08/17/22 1219       Allergies  Allergen Reactions   Paroxetine Hcl Other (See Comments)    Anger, not a nice person   Mirtazapine Other (See Comments)    Unknown reaction    Atorvastatin Other (See Comments)    Eye pain/ temple pain   Cefdinir Itching and Rash    Rash-gets blistery looking rash   Ciprofloxacin Rash   Doxazosin Anxiety   Fluoxetine Other (See Comments)    Sexual side  effects   Allergies were reviewed by me prior to the procedure  Objective   Body mass index is 33.64 kg/m. Vitals:   08/17/22 1209 08/17/22 1217  BP:  (!) 143/104  Pulse:  76  Resp:  16  Temp:  98.3 F (36.8 C)  SpO2:  94%  Weight: 115.7 kg   Height: 6\' 1"  (1.854 m)      Physical Exam Vitals and nursing note reviewed.  Constitutional:      General: He is not in acute distress.    Appearance: Normal appearance. He is not ill-appearing, toxic-appearing or diaphoretic.  HENT:     Head: Normocephalic and atraumatic.     Nose: Nose normal.     Mouth/Throat:     Mouth: Mucous membranes are moist.     Pharynx: Oropharynx is clear.  Eyes:     General: No scleral icterus.    Extraocular Movements: Extraocular movements intact.  Cardiovascular:     Rate and Rhythm: Normal rate and regular rhythm.     Heart sounds: Normal heart sounds. No murmur heard.    No friction rub. No gallop.  Pulmonary:     Effort: Pulmonary effort is normal. No respiratory distress.     Breath sounds: Normal breath sounds. No wheezing, rhonchi or rales.  Abdominal:     General: Bowel sounds are normal. There is no distension.     Palpations: Abdomen is soft.     Tenderness: There is no abdominal tenderness. There is no guarding or rebound.  Musculoskeletal:     Cervical back: Neck supple.     Right lower leg: No edema.     Left lower leg: No edema.  Skin:    General: Skin is warm and dry.     Coloration: Skin is not jaundiced or pale.  Neurological:     General: No focal deficit present.     Mental Status: He is alert and oriented to person, place, and time. Mental status is at baseline.  Psychiatric:        Mood and Affect: Mood normal.        Behavior: Behavior normal.        Thought Content: Thought content normal.        Judgment: Judgment normal.      Assessment:  Jeffrey [name] is a 77 y.o. male  who presents today for Esophagogastroduodenoscopy for surveillance- barretts  esophagus  Plan:  Esophagogastroduodenoscopy with  possible intervention today  Esophagogastroduodenoscopy with possible biopsy, control of bleeding, polypectomy, and interventions as necessary has been discussed with the patient/patient representative. Informed consent was obtained from the patient/patient representative after explaining the indication, nature, and risks of the procedure including but not limited to death, bleeding, perforation, missed neoplasm/lesions, cardiorespiratory compromise, and reaction to medications. Opportunity for questions was given and appropriate answers were provided. Patient/patient representative has verbalized understanding is amenable to undergoing the procedure.   [name], DO  [facility] Gastroenterology  Portions of the record may have been created with voice recognition software. Occasional wrong-word or 'sound-a-like' substitutions may have occurred due to the inherent limitations of voice recognition software.  Read the chart carefully and recognize, using context, where substitutions may have occurred.

## 2022-08-17 ENCOUNTER — Ambulatory Visit: Payer: Medicare HMO | Admitting: Anesthesiology

## 2022-08-17 ENCOUNTER — Ambulatory Visit
Admission: RE | Admit: 2022-08-17 | Discharge: 2022-08-17 | Disposition: A | Discharge status: Home / Self Care | Payer: Medicare HMO | Attending: Gastroenterology | Admitting: Gastroenterology

## 2022-08-17 ENCOUNTER — Encounter: Payer: Self-pay | Admitting: Gastroenterology

## 2022-08-17 ENCOUNTER — Encounter
Admission: RE | Disposition: A | Discharge status: Home / Self Care | Payer: Self-pay | Source: Home / Self Care | Attending: Gastroenterology

## 2022-08-17 DIAGNOSIS — Z79899 Other long term (current) drug therapy: Secondary | ICD-10-CM | POA: Insufficient documentation

## 2022-08-17 DIAGNOSIS — F419 Anxiety disorder, unspecified: Secondary | ICD-10-CM | POA: Insufficient documentation

## 2022-08-17 DIAGNOSIS — G4733 Obstructive sleep apnea (adult) (pediatric): Secondary | ICD-10-CM | POA: Insufficient documentation

## 2022-08-17 DIAGNOSIS — Z85828 Personal history of other malignant neoplasm of skin: Secondary | ICD-10-CM | POA: Insufficient documentation

## 2022-08-17 DIAGNOSIS — K219 Gastro-esophageal reflux disease without esophagitis: Secondary | ICD-10-CM | POA: Insufficient documentation

## 2022-08-17 DIAGNOSIS — Z96641 Presence of right artificial hip joint: Secondary | ICD-10-CM | POA: Insufficient documentation

## 2022-08-17 DIAGNOSIS — M1990 Unspecified osteoarthritis, unspecified site: Secondary | ICD-10-CM | POA: Diagnosis not present

## 2022-08-17 DIAGNOSIS — G709 Myoneural disorder, unspecified: Secondary | ICD-10-CM | POA: Insufficient documentation

## 2022-08-17 DIAGNOSIS — N189 Chronic kidney disease, unspecified: Secondary | ICD-10-CM | POA: Insufficient documentation

## 2022-08-17 DIAGNOSIS — K2270 Barrett's esophagus without dysplasia: Secondary | ICD-10-CM | POA: Insufficient documentation

## 2022-08-17 DIAGNOSIS — E785 Hyperlipidemia, unspecified: Secondary | ICD-10-CM | POA: Diagnosis not present

## 2022-08-17 DIAGNOSIS — I129 Hypertensive chronic kidney disease with stage 1 through stage 4 chronic kidney disease, or unspecified chronic kidney disease: Secondary | ICD-10-CM | POA: Diagnosis not present

## 2022-08-17 DIAGNOSIS — G894 Chronic pain syndrome: Secondary | ICD-10-CM | POA: Diagnosis not present

## 2022-08-17 HISTORY — DX: Chronic pain syndrome: G89.4

## 2022-08-17 HISTORY — PX: ESOPHAGOGASTRODUODENOSCOPY: SHX5428

## 2022-08-17 SURGERY — EGD (ESOPHAGOGASTRODUODENOSCOPY)
Anesthesia: General

## 2022-08-17 MED ORDER — PROPOFOL 500 MG/50ML IV EMUL
INTRAVENOUS | Status: DC | PRN
  Administered 2022-08-17: 150 ug/kg/min via INTRAVENOUS

## 2022-08-17 MED ORDER — SODIUM CHLORIDE 0.9 % IV SOLN: INTRAVENOUS | Status: DC

## 2022-08-17 MED ORDER — LIDOCAINE HCL (CARDIAC) PF 100 MG/5ML IV SOSY
PREFILLED_SYRINGE | INTRAVENOUS | Status: DC | PRN
  Administered 2022-08-17: 50 mg via INTRAVENOUS

## 2022-08-17 MED ORDER — PROPOFOL 10 MG/ML IV BOLUS
INTRAVENOUS | Status: DC | PRN
  Administered 2022-08-17: 80 mg via INTRAVENOUS

## 2022-08-17 NOTE — Anesthesia Postprocedure Evaluation (Signed)
Anesthesia Post Note  Patient: Jeffrey [name]  Procedure(s) Performed: ESOPHAGOGASTRODUODENOSCOPY (EGD)  Patient location during evaluation: Endoscopy Anesthesia Type: General Level of consciousness: awake and alert Pain management: pain level controlled Vital Signs Assessment: post-procedure vital signs reviewed and stable Respiratory status: spontaneous breathing, nonlabored ventilation, respiratory function stable and patient connected to nasal cannula oxygen Cardiovascular status: blood pressure returned to baseline and stable Postop Assessment: no apparent nausea or vomiting Anesthetic complications: no   No notable events documented.   Last Vitals:  Vitals:   08/17/22 1320 08/17/22 1330  BP: 109/86 (!) 126/90  Pulse: 68   Resp: 17 15  Temp:    SpO2: 94% 95%    Last Pain:  Vitals:   08/17/22 1320  TempSrc:   PainSc: 0-No pain                 Cleda Mccreedy Tyler Cubit

## 2022-08-17 NOTE — Op Note (Signed)
[facility] Gastroenterology Patient Name: Jeffrey [name] Procedure Date: 08/17/2022 12:50 PM MRN: [id number] Account #: [id number] Date of Birth: 12/12/1945 Admit Type: Outpatient Age: 77 Room: [facility] ENDO ROOM 2 Gender: Male Note Status: Finalized Instrument Name: Upper Endoscope [id number] Procedure:             Upper GI endoscopy Indications:           Surveillance for malignancy due to personal history of                         Barrett's esophagus Providers:             Trenda Moots, DO Referring MD:          Jaynie Collins DO, DO (Referring MD) Medicines:             Monitored Anesthesia Care Complications:         No immediate complications. Estimated blood loss:                         Minimal. Procedure:             Pre-Anesthesia Assessment:                        - Prior to the procedure, a History and Physical was                         performed, and patient medications and allergies were                         reviewed. The patient is competent. The risks and                         benefits of the procedure and the sedation options and                         risks were discussed with the patient. All questions                         were answered and informed consent was obtained.                         Patient identification and proposed procedure were                         verified by the physician, the nurse, the anesthetist                         and the technician in the endoscopy suite. Mental                         Status Examination: alert and oriented. Airway                         Examination: normal oropharyngeal airway and neck                         mobility. Respiratory Examination: clear to  auscultation. CV Examination: RRR, no murmurs, no S3                         or S4. Prophylactic Antibiotics: The patient does not                         require prophylactic antibiotics. Prior                          Anticoagulants: The patient has taken no anticoagulant                         or antiplatelet agents. ASA Grade Assessment: III - A                         patient with severe systemic disease. After reviewing                         the risks and benefits, the patient was deemed in                         satisfactory condition to undergo the procedure. The                         anesthesia plan was to use monitored anesthesia care                         (MAC). Immediately prior to administration of                         medications, the patient was re-assessed for adequacy                         to receive sedatives. The heart rate, respiratory                         rate, oxygen saturations, blood pressure, adequacy of                         pulmonary ventilation, and response to care were                         monitored throughout the procedure. The physical                         status of the patient was re-assessed after the                         procedure.                        After obtaining informed consent, the endoscope was                         passed under direct vision. Throughout the procedure,                         the patient's blood pressure, pulse, and oxygen  saturations were monitored continuously. The Endoscope                         was introduced through the mouth, and advanced to the                         second part of duodenum. The upper GI endoscopy was                         accomplished without difficulty. The patient tolerated                         the procedure well. Findings:      The duodenal bulb, first portion of the duodenum and second portion of       the duodenum were normal. Estimated blood loss: none.      The entire examined stomach was normal. Estimated blood loss: none.      Esophagogastric landmarks were identified: the gastroesophageal junction       was found at 44 cm from the  incisors.      There were esophageal mucosal changes classified as Barrett's stage       C0-M2 per Prague criteria present in the distal esophagus. The maximum       longitudinal extent of these mucosal changes was 2 cm in length. small       island of salmon colored mucosa extending up to 2cm above z-line Mucosa       was biopsied with a cold forceps for histology. One specimen bottle was       sent to pathology. Estimated blood loss was minimal.      Normal mucosa was found in the entire esophagus. The scope was       withdrawn. Dilation was performed with a Maloney dilator with no       resistance at 52 Fr. The dilation site was examined following endoscope       reinsertion and showed no change. Estimated blood loss: none.      The exam of the esophagus was otherwise normal. Impression:            - Normal duodenal bulb, first portion of the duodenum                         and second portion of the duodenum.                        - Normal stomach.                        - Esophagogastric landmarks identified.                        - Esophageal mucosal changes classified as Barrett's                         stage C0-M2 per Prague criteria. Biopsied.                        - Normal mucosa was found in the entire esophagus.                         Dilated.  Recommendation:        - Patient has a contact number available for                         emergencies. The signs and symptoms of potential                         delayed complications were discussed with the patient.                         Return to normal activities tomorrow. Written                         discharge instructions were provided to the patient.                        - Discharge patient to home.                        - Resume previous diet.                        - Continue present medications.                        - continue omeprazole/proton pump inhibitor therapy                        if no improvement  with swallowing post dilation-                         recommend modified barium swallow study as his                         described symptoms are oropharyngeal.                        - Await pathology results.                        - Repeat upper endoscopy for surveillance based on                         pathology results.                        - Return to GI office as previously scheduled.                        - The findings and recommendations were discussed with                         the patient. Procedure Code(s):     --- Professional ---                        [phone], Esophagogastroduodenoscopy, flexible,                         transoral; with biopsy, single or multiple  [id number], Dilation of esophagus, by unguided sound or                         bougie, single or multiple passes Diagnosis Code(s):     --- Professional ---                        K22.70, Barrett's esophagus without dysplasia CPT copyright 2022 American Medical Association. All rights reserved. The codes documented in this report are preliminary and upon coder review may  be revised to meet current compliance requirements. Attending Participation:      I personally performed the entire procedure. Elfredia Nevins, DO Jaynie Collins DO, DO 08/17/2022 1:12:24 PM This report has been signed electronically. Number of Addenda: 0 Note Initiated On: 08/17/2022 12:50 PM Estimated Blood Loss:  Estimated blood loss was minimal.      [facility]

## 2022-08-17 NOTE — Transfer of Care (Signed)
Immediate Anesthesia Transfer of Care Note  Patient: Jeffrey [name]  Procedure(s) Performed: ESOPHAGOGASTRODUODENOSCOPY (EGD)  Patient Location: Endoscopy Unit  Anesthesia Type:General  Level of Consciousness: sedated and drowsy  Airway & Oxygen Therapy: Patient Spontanous Breathing and Patient connected to nasal cannula oxygen  Post-op Assessment: Report given to RN and Post -op Vital signs reviewed and stable  Post vital signs: Reviewed and stable  Last Vitals:  Vitals Value Taken Time  BP 108/80 08/17/22 1312  Temp 36.4 C 08/17/22 1310  Pulse 80 08/17/22 1312  Resp 18 08/17/22 1312  SpO2 90 % 08/17/22 1312    Last Pain:  Vitals:   08/17/22 1310  TempSrc: Temporal  PainSc: Asleep         Complications: No notable events documented.

## 2022-08-17 NOTE — Anesthesia Preprocedure Evaluation (Addendum)
Anesthesia Evaluation  Patient identified by MRN, date of birth, ID band Patient awake    Reviewed: Allergy & Precautions, NPO status , Patient's Chart, lab work & pertinent test results  History of Anesthesia Complications Negative for: history of anesthetic complications  Airway Mallampati: III  TM Distance: <3 FB Neck ROM: full    Dental  (+) Chipped   Pulmonary neg shortness of breath, sleep apnea    Pulmonary exam normal        Cardiovascular Exercise Tolerance: Good hypertension, (-) angina Normal cardiovascular exam     Neuro/Psych  Headaches PSYCHIATRIC DISORDERS       Neuromuscular disease    GI/Hepatic Neg liver ROS, hiatal hernia,GERD  Controlled,,  Endo/Other  negative endocrine ROS    Renal/GU Renal disease  negative genitourinary   Musculoskeletal  (+) Arthritis ,    Abdominal   Peds  Hematology negative hematology ROS (+)   Anesthesia Other Findings Patient reports that they do not think that any food or pills are stuck in their throat at this time.  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 followed by               neurology. 2 year follow up from now. No date: Chronic kidney disease No date: Chronic pain syndrome No date: DDD (degenerative disc disease), lumbar No date: Depression No date: ED (erectile dysfunction) 09/2019: Esophageal candidiasis (HCC)     Comment:  treated with fluconazole for 2 weeks.  No date: GERD (gastroesophageal reflux disease) 10/2019: Headache     Comment:  silent migraine causes vision problems No date: History of hiatal hernia No date: Hyperlipidemia No date:  Hypertension No date: Lumbar radiculitis No date: Multiple thyroid nodules No date: Sleep apnea     Comment:  does not use a cpap machine.  No date: Tremors of nervous system No date: Vitiligo  Past Surgical History: No date: APPENDECTOMY 11/24/2021: CATARACT EXTRACTION W/PHACO; Left     Comment:  Procedure: CATARACT EXTRACTION PHACO AND INTRAOCULAR               LENS PLACEMENT (IOC) LEFT TORIC LENS 7.32 01:01.2;                Surgeon: Lockie Mola, MD;  Location: [facility]               SURGERY CN[facility];  Service: Ophthalmology;  Laterality: Left; No date: cheek surgery; Right     Comment:  stainless steel in right cheek No date: COLONOSCOPY 02/19/2017: COLONOSCOPY WITH PROPOFOL; N/A     Comment:  Procedure: COLONOSCOPY WITH PROPOFOL;  Surgeon: Toledo,               Boykin Nearing, MD;  Location: [facility] ENDOSCOPY;  Service:               Gastroenterology;  Laterality: N/A; 12/26/2021: COLONOSCOPY WITH PROPOFOL; N/A     Comment:  Procedure: COLONOSCOPY WITH PROPOFOL;  Surgeon: Jaynie Collins, DO;  Location: [facility] ENDOSCOPY;  Service:               Gastroenterology;  Laterality:  N/A; No date: ESOPHAGOGASTRODUODENOSCOPY 02/19/2017: ESOPHAGOGASTRODUODENOSCOPY (EGD) WITH PROPOFOL; N/A     Comment:  Procedure: ESOPHAGOGASTRODUODENOSCOPY (EGD) WITH               PROPOFOL;  Surgeon: Toledo, Boykin Nearing, MD;  Location:               [facility] ENDOSCOPY;  Service: Gastroenterology;  Laterality:               N/A; 09/27/2019: ESOPHAGOGASTRODUODENOSCOPY (EGD) WITH PROPOFOL; N/A     Comment:  Procedure: ESOPHAGOGASTRODUODENOSCOPY (EGD) WITH               PROPOFOL;  Surgeon: Toledo, Boykin Nearing, MD;  Location:               [facility] ENDOSCOPY;  Service: Gastroenterology;  Laterality:               N/A; No date: FUNCTIONAL ENDOSCOPIC SINUS SURGERY     Comment:  uvuloplasty done for OSA No date: HERNIA REPAIR     Comment:  hiatal 04/17/2022: IR ANGIOGRAM PELVIS SELECTIVE OR  SUPRASELECTIVE 04/17/2022: IR ANGIOGRAM PELVIS SELECTIVE OR SUPRASELECTIVE 04/27/2022: IR ANGIOGRAM PELVIS SELECTIVE OR SUPRASELECTIVE 04/17/2022: IR ANGIOGRAM SELECTIVE EACH ADDITIONAL VESSEL 04/17/2022: IR ANGIOGRAM SELECTIVE EACH ADDITIONAL VESSEL 04/27/2022: IR ANGIOGRAM SELECTIVE EACH ADDITIONAL VESSEL 04/27/2022: IR ANGIOGRAM SELECTIVE EACH ADDITIONAL VESSEL 04/17/2022: IR CT SPINE LTD 04/17/2022: IR EMBO ARTERIAL NOT HEMORR HEMANG INC GUIDE ROADMAPPING 04/27/2022: IR EMBO ARTERIAL NOT HEMORR HEMANG INC GUIDE ROADMAPPING 03/20/2022: IR RADIOLOGIST EVAL & MGMT 05/27/2022: IR RADIOLOGIST EVAL & MGMT 04/17/2022: IR US GUIDE VASC ACCESS LEFT 04/27/2022: IR US GUIDE VASC ACCESS RIGHT 2012: JOINT REPLACEMENT; Right     Comment:  THR No date: JOINT REPLACEMENT; Bilateral     Comment:  TKR 07/09/2021: TOTAL HIP ARTHROPLASTY; Left     Comment:  Procedure: TOTAL HIP ARTHROPLASTY ANTERIOR APPROACH;                Surgeon: Ollen Gross, MD;  Location: [facility];  Service:              Orthopedics;  Laterality: Left;  BMI    Body Mass Index: 33.64 kg/m      Reproductive/Obstetrics negative OB ROS                             Anesthesia Physical Anesthesia Plan  ASA: 3  Anesthesia Plan: General   Post-op Pain Management:    Induction: Intravenous  PONV Risk Score and Plan: Propofol infusion and TIVA  Airway Management Planned: Natural Airway and Nasal Cannula  Additional Equipment:   Intra-op Plan:   Post-operative Plan:   Informed Consent: I have reviewed the patients History and Physical, chart, labs and discussed the procedure including the risks, benefits and alternatives for the proposed anesthesia with the patient or authorized representative who has indicated his/her understanding and acceptance.     Dental Advisory Given  Plan Discussed with: Anesthesiologist, CRNA and Surgeon  Anesthesia Plan Comments: (Patient consented for risks of anesthesia including  but not limited to:  - adverse reactions to medications - risk of airway placement if required - damage to eyes, teeth, lips or other oral mucosa - nerve damage due to positioning  - sore throat or hoarseness - Damage to heart, brain, nerves, lungs, other parts of body or loss of life  Patient voiced understanding.)       Anesthesia Quick Evaluation

## 2022-08-17 NOTE — Anesthesia Procedure Notes (Signed)
Date/Time: 08/17/2022 12:51 PM  Performed by: Ginger Carne, CRNAPre-anesthesia Checklist: Patient identified, Emergency Drugs available, Suction available, Patient being monitored and Timeout performed Patient Re-evaluated:Patient Re-evaluated prior to induction Oxygen Delivery Method: Nasal cannula Preoxygenation: Pre-oxygenation with 100% oxygen Induction Type: IV induction

## 2022-08-17 NOTE — Interval H&P Note (Signed)
History and Physical Interval Note: Preprocedure H&P from 08/17/22  was reviewed and there was no interval change after seeing and examining the patient.  Written consent was obtained from the patient after discussion of risks, benefits, and alternatives. Patient has consented to proceed with Esophagogastroduodenoscopy with possible intervention   08/17/2022 12:43 PM  Jeffrey [name]  has presented today for surgery, with the diagnosis of Barrett's esophagus without dysplasia (K22.70).  The various methods of treatment have been discussed with the patient and family. After consideration of risks, benefits and other options for treatment, the patient has consented to  Procedure(s): ESOPHAGOGASTRODUODENOSCOPY (EGD) (N/A) as a surgical intervention.  The patient's history has been reviewed, patient examined, no change in status, stable for surgery.  I have reviewed the patient's chart and labs.  Questions were answered to the patient's satisfaction.     Jaynie Collins

## 2022-08-18 ENCOUNTER — Encounter: Payer: Self-pay | Admitting: Gastroenterology

## 2022-08-18 ENCOUNTER — Other Ambulatory Visit: Payer: Medicare HMO

## 2022-08-18 LAB — SURGICAL PATHOLOGY

## 2022-08-19 ENCOUNTER — Encounter: Payer: Self-pay | Admitting: Gastroenterology

## 2022-08-25 ENCOUNTER — Ambulatory Visit
Admission: RE | Admit: 2022-08-25 | Discharge: 2022-08-25 | Disposition: A | Discharge status: Home / Self Care | Payer: Medicare HMO | Source: Ambulatory Visit | Attending: Interventional Radiology | Admitting: Interventional Radiology

## 2022-08-25 DIAGNOSIS — N401 Enlarged prostate with lower urinary tract symptoms: Secondary | ICD-10-CM

## 2022-08-25 HISTORY — PX: IR RADIOLOGIST EVAL & MGMT: IMG5224

## 2022-08-25 NOTE — Progress Notes (Signed)
Referring Physician(s): Legrand Rams, MD   Chief Complaint: The patient is seen in follow up today s/p prostate artery embolization   History of present illness: 77 year old male with history of benign prostatic hyperplasia with lower urinary tract symptoms status post UroLift by Dr. Evelene Croon in 2021, and now status post prostate artery embolizations on 04/17/22 and 04/27/22.  Initial procedure on 04/17/22 from left radial access was limited by severe tortuosity of his aorta and iliac vessels, with inability of our catheters/wires to reach the target prostatic vessels, also by variant anatomy of right prostatic artery supply arising via the corona mortis.  Only the right inferior prostatic artery was embolized during the first procedure.  On 04/27/22, from femoral artery approach, the remaining right superior, and left superior and inferior prostatic arteries with embolized with particles and coils.  He had an uneventful post-procedure course.  Today he reports no real improvement in symptoms.  He is quite disappointed.  He complains of some new sexual dysfunction including absent/retrograde ejaculation with significant decrease in semen volume, pelvic pain after orgasm, and describes some induration of an area on the foreskin.  He has remained off all prostate medications.     IPSS-QoL  (03/20/22 --> 05/27/22 --> 08/25/22) Incomplete emptying:  4 --> 3 --> 5 Frequency:  4 --> 2 -- 2 Intermittency:  5 --> 2 --> 3 Urgency:  3 --> 2 --> 3 Weak stream:  5 --> 4 --> 5 Straining:  3 --> 3 --> 4 Nocturia: 4 --> 4 --> 5 QOL:  5 --> 3 --> 4   Score = 33 --> 23 --> 27   He last saw Dr. Aleene Davidson on 06/16/22 at which time PVRs were measured at 187 cc.  He will follow up there in 3 months.  Post-PAE prostate MRI was completed on 08/12/22 which shows volume reduction of ~30%.     Past Medical History:  Diagnosis Date   Anal fissure    Anemia    Anxiety    Arthritis    Barrett esophagus    BPH (benign  prostatic hyperplasia)    Bruises easily    Cancer (HCC)    skin cancer removed with MOHS   Cerebral aneurysm 10/2019   4mm saccular left ophthalmic artery. followed by neurology. 2 year follow up from now.   Chronic kidney disease    Chronic pain syndrome    DDD (degenerative disc disease), lumbar    Depression    ED (erectile dysfunction)    Esophageal candidiasis (HCC) 09/2019   treated with fluconazole for 2 weeks.    GERD (gastroesophageal reflux disease)    Headache 10/2019   silent migraine causes vision problems   History of hiatal hernia    Hyperlipidemia    Hypertension    Lumbar radiculitis    Multiple thyroid nodules    Sleep apnea    does not use a cpap machine.    Tremors of nervous system    Vitiligo     Past Surgical History:  Procedure Laterality Date   APPENDECTOMY     CATARACT EXTRACTION W/PHACO Left 11/24/2021   Procedure: CATARACT EXTRACTION PHACO AND INTRAOCULAR LENS PLACEMENT (IOC) LEFT TORIC LENS 7.32 01:01.2;  Surgeon: Lockie Mola, MD;  Location: [facility];  Service: Ophthalmology;  Laterality: Left;   cheek surgery Right    stainless steel in right cheek   COLONOSCOPY     COLONOSCOPY WITH PROPOFOL N/A 02/19/2017   Procedure: COLONOSCOPY WITH PROPOFOL;  Surgeon: Toledo, Boykin Nearing, MD;  Location: [facility] ENDOSCOPY;  Service: Gastroenterology;  Laterality: N/A;   COLONOSCOPY WITH PROPOFOL N/A 12/26/2021   Procedure: COLONOSCOPY WITH PROPOFOL;  Surgeon: Jaynie Collins, DO;  Location: [facility] ENDOSCOPY;  Service: Gastroenterology;  Laterality: N/A;   ESOPHAGOGASTRODUODENOSCOPY     ESOPHAGOGASTRODUODENOSCOPY N/A 08/17/2022   Procedure: ESOPHAGOGASTRODUODENOSCOPY (EGD);  Surgeon: Jaynie Collins, DO;  Location: [facility] ENDOSCOPY;  Service: Gastroenterology;  Laterality: N/A;   ESOPHAGOGASTRODUODENOSCOPY (EGD) WITH PROPOFOL N/A 02/19/2017   Procedure: ESOPHAGOGASTRODUODENOSCOPY (EGD) WITH PROPOFOL;  Surgeon: Toledo, Boykin Nearing, MD;   Location: [facility] ENDOSCOPY;  Service: Gastroenterology;  Laterality: N/A;   ESOPHAGOGASTRODUODENOSCOPY (EGD) WITH PROPOFOL N/A 09/27/2019   Procedure: ESOPHAGOGASTRODUODENOSCOPY (EGD) WITH PROPOFOL;  Surgeon: Toledo, Boykin Nearing, MD;  Location: [facility] ENDOSCOPY;  Service: Gastroenterology;  Laterality: N/A;   FUNCTIONAL ENDOSCOPIC SINUS SURGERY     uvuloplasty done for OSA   HERNIA REPAIR     hiatal   IR ANGIOGRAM PELVIS SELECTIVE OR SUPRASELECTIVE  04/17/2022   IR ANGIOGRAM PELVIS SELECTIVE OR SUPRASELECTIVE  04/17/2022   IR ANGIOGRAM PELVIS SELECTIVE OR SUPRASELECTIVE  04/27/2022   IR ANGIOGRAM SELECTIVE EACH ADDITIONAL VESSEL  04/17/2022   IR ANGIOGRAM SELECTIVE EACH ADDITIONAL VESSEL  04/17/2022   IR ANGIOGRAM SELECTIVE EACH ADDITIONAL VESSEL  04/27/2022   IR ANGIOGRAM SELECTIVE EACH ADDITIONAL VESSEL  04/27/2022   IR CT SPINE LTD  04/17/2022   IR EMBO ARTERIAL NOT HEMORR HEMANG INC GUIDE ROADMAPPING  04/17/2022   IR EMBO ARTERIAL NOT HEMORR HEMANG INC GUIDE ROADMAPPING  04/27/2022   IR RADIOLOGIST EVAL & MGMT  03/20/2022   IR RADIOLOGIST EVAL & MGMT  05/27/2022   IR US GUIDE VASC ACCESS LEFT  04/17/2022   IR US GUIDE VASC ACCESS RIGHT  04/27/2022   JOINT REPLACEMENT Right 2012   THR   JOINT REPLACEMENT Bilateral    TKR   TOTAL HIP ARTHROPLASTY Left 07/09/2021   Procedure: TOTAL HIP ARTHROPLASTY ANTERIOR APPROACH;  Surgeon: Ollen Gross, MD;  Location: [facility];  Service: Orthopedics;  Laterality: Left;    Allergies: Paroxetine hcl, Mirtazapine, Atorvastatin, Cefdinir, Ciprofloxacin, Doxazosin, and Fluoxetine  Medications: Prior to Admission medications   Medication Sig Start Date End Date Taking? Authorizing Provider  amLODipine (NORVASC) 10 MG tablet Take 10 mg by mouth daily.  10/27/13   [provider]  aspirin-acetaminophen-caffeine (EXCEDRIN MIGRAINE) [phone] MG tablet Take 2 tablets by mouth every 6 (six) hours as needed for headache.    [provider]  azithromycin  (ZITHROMAX) 250 MG tablet Take 250 mg by mouth as directed. 06/30/22   [provider]  chlorhexidine (PERIDEX) 0.12 % solution SMARTSIG:By Mouth 03/26/22   [provider]  chlorthalidone (HYGROTON) 25 MG tablet Take 12.5 mg daily by mouth.    [provider]  HYDROcodone-acetaminophen (NORCO/VICODIN) 5-325 MG tablet Take 0.5-1 tablets by mouth 2 (two) times daily as needed for moderate pain. 03/10/22   [provider]  KLOR-CON M20 20 MEQ tablet Take 20 mEq by mouth 2 (two) times daily. 01/07/22   [provider]  lisinopril (PRINIVIL,ZESTRIL) 40 MG tablet Take 40 mg by mouth daily. 10/23/13   [provider]  mometasone (ELOCON) 0.1 % ointment Apply topically. 06/03/22   [provider]  Multiple Vitamin (MULTIVITAMIN WITH MINERALS) TABS tablet Take 1 tablet by mouth daily.    [provider]  Multiple Vitamins-Minerals (PRESERVISION AREDS 2 PO) Take 1 capsule by mouth 2 (two) times daily.    [provider]  omeprazole (PRILOSEC) 40 MG capsule Take 40 mg by mouth daily. 03/03/22   [provider]  OVER THE COUNTER MEDICATION Take 1 tablet by mouth daily. Vision Max supplement    [provider]  Polyvinyl Alcohol-Povidone (REFRESH OP) Place 1 drop into both eyes daily as needed (dry eyes).    [provider]  propranolol (INDERAL) 20 MG tablet Take 20 mg by mouth daily as needed (anxiety). 10/10/21   [provider]     No family history on file.  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   Drug use: No   Sexual activity: Not Currently  Other Topics Concern   Not on file  Social History Narrative   Patient lives with just wife.   Social Determinants of Health   Financial Resource Strain: Not on file  Food Insecurity: Not on file  Transportation Needs: Not on file  Physical Activity: Not on file  Stress: Not on file  Social Connections: Not on file     Vital Signs: There were no vitals taken for this visit.  Physical Exam Constitutional:      General: He is not in acute distress. HENT:     Head: Normocephalic.     Mouth/Throat:     Mouth: Mucous membranes are moist.  Cardiovascular:     Rate and Rhythm: Normal rate.     Heart sounds: Normal heart sounds.  Pulmonary:     Effort: Pulmonary effort is normal.  Abdominal:     General: There is no distension.  Musculoskeletal:        General: No swelling.  Skin:    General: Skin is warm and dry.  Neurological:     Mental Status: He is alert and oriented to person, place, and time.     Imaging: CT AP 04/16/19     5.3 x 5.2 x 6.9 = 99.57 g   MRI Prostate 08/12/22  6.5 x 4.7 x 4.3 cm = ~66 g (31% volume reduction)       Labs:  CBC: Recent Labs    10/30/21 1126 04/17/22 0926 04/27/22 0807  WBC 5.9 5.7 5.5  HGB 14.2 14.9 14.3  HCT 44.1 42.8 41.7  PLT 163 147* 155    COAGS: Recent Labs    04/17/22 0926 04/27/22 0807  INR 1.0 1.1    BMP: Recent Labs    10/30/21 1126 04/17/22 0926 04/27/22 0807  NA 141 139 137  K 3.5 3.5 3.2*  CL 102 103 104  CO2 28 27 26   GLUCOSE 152* 107* 104*  BUN 17 18 18   CALCIUM 9.5 9.2 9.1  CREATININE 1.15 0.90 0.92  GFRNONAA >60 >60 >60    LIVER FUNCTION TESTS: Recent Labs    10/30/21 1126  BILITOT 1.1  AST 24  ALT 22  ALKPHOS 63  PROT 6.7  ALBUMIN 3.9    PSA = 5.35 (01/12/22, [facility])     Assessment and Plan: 77 year old  male with symptomatic benign prostatic hyperplasia now status post  prostate artery embolization (04/27/22) with persistent severe lower urinary tract symptoms IPSS-QoL (33 [pre]-->23 -> 27).  Post-treatment MRI prostate was performed which demonstrates a 31% volume reduction in prostate size.  He describes several sexual dysfunction symptoms which I have not seen after PAE and am skeptical they are related, though plausible.    He remains averse to any sort of surgical intervention.  After discussion about next steps, I believe that resuming silodosin +/- finasteride may be beneficial.  I will defer to Dr. Richardo Hanks on this matter.  We discussed following up in 3 months to assess any further progression.  I am willing to repeat pelvic angiogram to assess for any new or missed vascular pathways that may be supplying the prostate gland that would be contributing to suboptimal symptom outcome, but believe we should wait at least 6 months post treatment to get a full initial treatment response.    Mr. Bernales is amenable to this plan.  We'll see him back in 3 months.  I'll reach out to Dr. Aleene Davidson regarding our discussion today, specifically about resuming alpha-adrenergic blocker.   Electronically Signed: Bennie Dallas 08/25/2022, 7:46 AM   I spent a total of 40 Minutes in face to face in clinical consultation, greater than 50% of which was counseling/coordinating care for benign prostatic hyperplasia.

## 2022-08-27 DIAGNOSIS — M79671 Pain in right foot: Secondary | ICD-10-CM | POA: Insufficient documentation

## 2022-08-27 DIAGNOSIS — M7989 Other specified soft tissue disorders: Secondary | ICD-10-CM | POA: Insufficient documentation

## 2022-09-30 ENCOUNTER — Encounter: Payer: Self-pay | Admitting: Urology

## 2022-09-30 ENCOUNTER — Ambulatory Visit: Payer: Medicare HMO | Admitting: Urology

## 2022-09-30 VITALS — BP 146/90 | HR 70 | Ht 73.0 in | Wt 258.0 lb

## 2022-09-30 DIAGNOSIS — N3281 Overactive bladder: Secondary | ICD-10-CM | POA: Diagnosis not present

## 2022-09-30 DIAGNOSIS — N401 Enlarged prostate with lower urinary tract symptoms: Secondary | ICD-10-CM

## 2022-09-30 DIAGNOSIS — R351 Nocturia: Secondary | ICD-10-CM

## 2022-09-30 DIAGNOSIS — N529 Male erectile dysfunction, unspecified: Secondary | ICD-10-CM | POA: Diagnosis not present

## 2022-09-30 DIAGNOSIS — R350 Frequency of micturition: Secondary | ICD-10-CM | POA: Diagnosis not present

## 2022-09-30 DIAGNOSIS — N400 Enlarged prostate without lower urinary tract symptoms: Secondary | ICD-10-CM

## 2022-09-30 LAB — BLADDER SCAN AMB NON-IMAGING

## 2022-09-30 MED ORDER — TADALAFIL 5 MG PO TABS: 5.0000 mg | ORAL_TABLET | Freq: Every day | ORAL | 3 refills | Status: DC | PRN

## 2022-09-30 MED ORDER — GEMTESA 75 MG PO TABS
75.0000 mg | ORAL_TABLET | Freq: Every day | ORAL | 0 refills | Status: AC
Start: 2022-09-30 — End: 2022-10-28

## 2022-09-30 MED ORDER — FINASTERIDE 5 MG PO TABS: 5.0000 mg | ORAL_TABLET | Freq: Every day | ORAL | 3 refills | Status: DC

## 2022-09-30 NOTE — Progress Notes (Signed)
   09/30/2022 3:10 PM   Jeffrey [name] Jun 11, 1945 [id number]  Reason for visit: Follow up BPH/urinary symptoms, nocturia, ED, low libido  HPI: 77 year old male who was previously managed by Dr. Sheppard Penton, and underwent a UroLift with him in 2021.  He denies that this improved his urinary symptoms at all.  When we originally met in October 2023 he reported a number of urinary symptoms including nocturia, genital pain, pain with ejaculations, weak/split urinary stream.  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    He underwent a cystoscopy in November 2023 that showed significantly enlarged prostate despite prior UroLift, prostate measured 83 g on a prior CT, PSA was 5.35, which was really stable from prior, reassuring PSA density, normal for his age.  He ultimately followed up with interventional radiology and opted for a prostatic artery embolization.  This was performed on 04/17/2022, and secondary to difficulties with access, a second procedure on 04/27/2022 was performed.  After the second procedure, his IPSS score improved to 23 from 33 preop, but he continues to report bothersome split stream and nocturia 3-5 times overnight.  Today he continues to be unhappy with his urinary symptoms.  PVR is normal at 80ml.  Primary urinary complaints are nocturia, postvoid dribbling, urgency/frequency, and sensation of incomplete emptying.  He continues to drink coffee in the morning, soda and tea in the afternoon, and tea in the evening.  We again reviewed behavioral strategies for his urinary symptoms including avoiding bladder irritants, weight loss, minimizing fluids before bedtime, and resuming CPAP for sleep apnea.  We discussed the correlation between sleep apnea and nocturia extensively.  We also discussed other alternatives like a trial of an alpha-blocker like silodosin which was prescribed by his PCP recently, or even considering HOLEP in the  future if he continues to be significantly bothered by his urinary symptoms despite trying the above strategies.  We reviewed nocturia is multifactorial and can be very challenging especially in older patients to completely resolve.  Currently using Cialis 5 mg as needed for erections, I recommended he try this daily which can also help with his BPH and urinary symptoms.  Regarding his urinary symptoms we discussed a number of other strategies including a trial of finasteride or an OAB medication like Gemtesa.  Risks and benefits reviewed at length.  He ultimately was interested in a trial of finasteride, we discussed it can take up to 6 months to see improvement.  He also requested samples of Gemtesa, and he could try this in a few weeks if no improvement on the daily Cialis.  I do think he would benefit from increased compliance with CPAP in terms of his improvement and nocturia.  -Continue Cialis 5 mg daily,  increased to 10 to 15 mg boost dose as needed prior to sexual activity -Behavioral strategies discussed extensively regarding urinary symptoms and nocturia -Encouraged to resume CPAP for sleep apnea -Trial of finasteride 5 mg daily -Samples of Gemtesa provided -Offered testosterone evaluation, patient unsure if he would like to have this checked, lab order placed if he changes his mind -RTC 6 months PVR    Sondra Come, MD  [facility] [address] [place], [place] [address] [phone]

## 2022-09-30 NOTE — Patient Instructions (Addendum)
Urinary symptoms and nocturia can have numerous different causes.  Using your CPAP machine can often help significantly with overnight urination.  Getting the mask changed or using a nasal piece only can sometimes be more helpful.  Minimizing any fluid intake 3 to 4 hours before bedtime, and urinating right before bed can also help.  Weight loss helps decrease the amount of weight pushing on the bladder and allows you to hold more urine and get up less overnight.  Recommend discussing alternative blood pressure medications different than amlodipine with your PCP, this can cause leg swelling that contributes overnight urination.  We are adding Cialis and finasteride daily, as well as samples of Gemtesa for overactive bladder to see if this helps with your urgency and frequency.  Nocturia refers to the need to wake up during the night to urinate, which can disrupt your sleep and impact your overall well-being. Fortunately, there are several strategies you can employ to help prevent or manage nocturia. It's important to consult with your healthcare provider before making any significant changes to your routine. Here are some helpful strategies to consider:  Limit Fluid Intake Before Bed: Avoid drinking large amounts of fluids in the evening, especially within a few hours of bedtime. Consume most of your daily fluid intake earlier in the day to reduce the need to urinate at night.  Monitor Your Diet: Limit your intake of caffeine and alcohol, as these substances can increase urine production and irritate the bladder.  Avoid diet, "zero calorie," and artificially sweetened drinks, especially sodas, in the afternoon or evening. Be mindful of consuming foods and drinks with high water content before bedtime, such as watermelon and herbal teas.  Time Your Medications: If you're taking medications that contribute to increased urination, consult your healthcare provider about adjusting the timing of  these medications to minimize their impact during the night.  Practice Double Voiding: Before going to bed, make an effort to empty your bladder twice within a short period. This can help reduce the amount of urine left in your bladder before sleep.  Bladder Training: Gradually increase the time between bathroom visits during the day to train your bladder to hold larger volumes of urine. Over time, this can help reduce the frequency of nighttime awakenings to urinate.  Elevate Your Legs During the Day: Elevating your legs during the day can help minimize fluid retention in your lower extremities, which might reduce nighttime urination.  Pelvic Floor Exercises: Strengthening your pelvic floor muscles through Kegel exercises can help improve bladder control and potentially reduce the urge to urinate at night.  Create a Relaxing Bedtime Routine: Stress and anxiety can exacerbate nocturia. Engage in calming activities before bed, such as reading, listening to soothing music, or practicing relaxation techniques.  Stay Active: Engage in regular physical activity, but avoid intense exercise close to bedtime, as this can increase your body's demand for fluids.  Maintain a Healthy Weight: Excess weight can compress the bladder and contribute to bladder and urinary issues. Aim to achieve and maintain a healthy weight through a balanced diet and regular exercise.  Remember that every individual is unique, and the effectiveness of these strategies may vary. It's important to work with your healthcare provider to develop a plan that suits your specific needs and addresses any underlying causes of nocturia.

## 2022-10-14 ENCOUNTER — Ambulatory Visit: Payer: Medicare HMO | Admitting: Podiatry

## 2022-10-14 ENCOUNTER — Encounter: Payer: Self-pay | Admitting: Podiatry

## 2022-10-14 ENCOUNTER — Ambulatory Visit (INDEPENDENT_AMBULATORY_CARE_PROVIDER_SITE_OTHER): Payer: Medicare HMO

## 2022-10-14 DIAGNOSIS — M2041 Other hammer toe(s) (acquired), right foot: Secondary | ICD-10-CM | POA: Diagnosis not present

## 2022-10-14 DIAGNOSIS — S92504A Nondisplaced unspecified fracture of right lesser toe(s), initial encounter for closed fracture: Secondary | ICD-10-CM

## 2022-10-14 DIAGNOSIS — S9031XA Contusion of right foot, initial encounter: Secondary | ICD-10-CM | POA: Diagnosis not present

## 2022-10-14 DIAGNOSIS — L603 Nail dystrophy: Secondary | ICD-10-CM

## 2022-10-14 NOTE — Progress Notes (Signed)
Subjective:  Patient ID: Jeffrey [name], male    DOB: 1945-04-29,  MRN: [id number] HPI Chief Complaint  Patient presents with   Toe Pain    5th toe right - broke toe 6-7 weeks ago, still tender, gets red, shoes aggravate  2nd toe right - hammertoe deformity   Nail Problem    Toenails - thick, discolored nails    New Patient (Initial Visit)    77 y.o. male presents with the above complaint.   ROS: He denies fever chills nausea vomiting muscle aches pains calf pain back pain chest pain shortness of breath.  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 followed by neurology. 2 year follow up from now.   Chronic kidney disease    Chronic pain syndrome    DDD (degenerative disc disease), lumbar    Depression    ED (erectile dysfunction)    Esophageal candidiasis (HCC) 09/2019   treated with fluconazole for 2 weeks.    GERD (gastroesophageal reflux disease)    Headache 10/2019   silent migraine causes vision problems   History of hiatal hernia    Hyperlipidemia    Hypertension    Lumbar radiculitis    Multiple thyroid nodules    Sleep apnea    does not use a cpap machine.    Tremors of nervous system    Vitiligo    Past Surgical History:  Procedure Laterality Date   APPENDECTOMY     CATARACT EXTRACTION W/PHACO Left 11/24/2021   Procedure: CATARACT EXTRACTION PHACO AND INTRAOCULAR LENS PLACEMENT (IOC) LEFT TORIC LENS 7.32 01:01.2;  Surgeon: Lockie Mola, MD;  Location: [facility];  Service: Ophthalmology;  Laterality: Left;   cheek surgery Right    stainless steel in right cheek   COLONOSCOPY     COLONOSCOPY WITH PROPOFOL N/A 02/19/2017   Procedure: COLONOSCOPY WITH PROPOFOL;  Surgeon: Toledo, Boykin Nearing, MD;   Location: [facility];  Service: Gastroenterology;  Laterality: N/A;   COLONOSCOPY WITH PROPOFOL N/A 12/26/2021   Procedure: COLONOSCOPY WITH PROPOFOL;  Surgeon: Jaynie Collins, DO;  Location: [facility];  Service: Gastroenterology;  Laterality: N/A;   ESOPHAGOGASTRODUODENOSCOPY     ESOPHAGOGASTRODUODENOSCOPY N/A 08/17/2022   Procedure: ESOPHAGOGASTRODUODENOSCOPY (EGD);  Surgeon: Jaynie Collins, DO;  Location: [facility];  Service: Gastroenterology;  Laterality: N/A;   ESOPHAGOGASTRODUODENOSCOPY (EGD) WITH PROPOFOL N/A 02/19/2017   Procedure: ESOPHAGOGASTRODUODENOSCOPY (EGD) WITH PROPOFOL;  Surgeon: Toledo, Boykin Nearing, MD;  Location: [facility];  Service: Gastroenterology;  Laterality: N/A;   ESOPHAGOGASTRODUODENOSCOPY (EGD) WITH PROPOFOL N/A 09/27/2019   Procedure: ESOPHAGOGASTRODUODENOSCOPY (EGD) WITH PROPOFOL;  Surgeon: Toledo, Boykin Nearing, MD;  Location: [facility];  Service: Gastroenterology;  Laterality: N/A;   FUNCTIONAL ENDOSCOPIC SINUS SURGERY     uvuloplasty done for OSA   HERNIA REPAIR     hiatal   IR ANGIOGRAM PELVIS SELECTIVE OR SUPRASELECTIVE  04/17/2022   IR ANGIOGRAM PELVIS SELECTIVE OR SUPRASELECTIVE  04/17/2022   IR ANGIOGRAM PELVIS SELECTIVE OR SUPRASELECTIVE  04/27/2022   IR ANGIOGRAM SELECTIVE EACH ADDITIONAL VESSEL  04/17/2022   IR ANGIOGRAM SELECTIVE EACH ADDITIONAL VESSEL  04/17/2022   IR ANGIOGRAM SELECTIVE EACH ADDITIONAL VESSEL  04/27/2022   IR  ANGIOGRAM SELECTIVE EACH ADDITIONAL VESSEL  04/27/2022   IR CT SPINE LTD  04/17/2022   IR EMBO ARTERIAL NOT HEMORR HEMANG INC GUIDE ROADMAPPING  04/17/2022   IR EMBO ARTERIAL NOT HEMORR HEMANG INC GUIDE ROADMAPPING  04/27/2022   IR RADIOLOGIST EVAL & MGMT  03/20/2022   IR RADIOLOGIST EVAL & MGMT  05/27/2022   IR RADIOLOGIST EVAL & MGMT  08/25/2022   IR US GUIDE VASC ACCESS LEFT  04/17/2022   IR US GUIDE VASC ACCESS RIGHT  04/27/2022   JOINT REPLACEMENT Right 2012   THR   JOINT REPLACEMENT Bilateral    TKR    TOTAL HIP ARTHROPLASTY Left 07/09/2021   Procedure: TOTAL HIP ARTHROPLASTY ANTERIOR APPROACH;  Surgeon: Ollen Gross, MD;  Location: [facility];  Service: Orthopedics;  Laterality: Left;    Current Outpatient Medications:    amLODipine (NORVASC) 10 MG tablet, Take 10 mg by mouth daily. , Disp: , Rfl:    aspirin-acetaminophen-caffeine (EXCEDRIN MIGRAINE) 250-250-65 MG tablet, Take 2 tablets by mouth every 6 (six) hours as needed for headache., Disp: , Rfl:    chlorhexidine (PERIDEX) 0.12 % solution, SMARTSIG:By Mouth, Disp: , Rfl:    chlorthalidone (HYGROTON) 25 MG tablet, Take 12.5 mg daily by mouth., Disp: , Rfl:    finasteride (PROSCAR) 5 MG tablet, Take 1 tablet (5 mg total) by mouth daily., Disp: 90 tablet, Rfl: 3   HYDROcodone-acetaminophen (NORCO/VICODIN) 5-325 MG tablet, Take 0.5-1 tablets by mouth 2 (two) times daily as needed for moderate pain., Disp: , Rfl:    KLOR-CON M20 20 MEQ tablet, Take 20 mEq by mouth 2 (two) times daily., Disp: , Rfl:    lisinopril (PRINIVIL,ZESTRIL) 40 MG tablet, Take 40 mg by mouth daily., Disp: , Rfl:    mometasone (ELOCON) 0.1 % ointment, Apply topically., Disp: , Rfl:    Multiple Vitamin (MULTIVITAMIN WITH MINERALS) TABS tablet, Take 1 tablet by mouth daily., Disp: , Rfl:    Multiple Vitamins-Minerals (PRESERVISION AREDS 2 PO), Take 1 capsule by mouth 2 (two) times daily., Disp: , Rfl:    omeprazole (PRILOSEC) 40 MG capsule, Take 40 mg by mouth daily., Disp: , Rfl:    OVER THE COUNTER MEDICATION, Take 1 tablet by mouth daily. Vision Jack Bolio supplement, Disp: , Rfl:    Polyvinyl Alcohol-Povidone (REFRESH OP), Place 1 drop into both eyes daily as needed (dry eyes)., Disp: , Rfl:    propranolol (INDERAL) 20 MG tablet, Take 20 mg by mouth daily as needed (anxiety)., Disp: , Rfl:    tadalafil (CIALIS) 5 MG tablet, Take 1 tablet (5 mg total) by mouth daily as needed for erectile dysfunction., Disp: 90 tablet, Rfl: 3   Vibegron (GEMTESA) 75 MG TABS, Take 1 tablet (75 mg  total) by mouth daily for 28 days., Disp: 28 tablet, Rfl: 0 No current facility-administered medications for this visit.  Facility-Administered Medications Ordered in Other Visits:    lidocaine-prilocaine (EMLA) cream, , Topical, Once, Ralene Muskrat, PA-C   nitroGLYCERIN NICU ointment 2%, , Topical, Once, Ralene Muskrat, PA-C  Allergies  Allergen Reactions   Paroxetine Hcl Other (See Comments)    Anger, not a nice person   Mirtazapine Other (See Comments)    Unknown reaction    Atorvastatin Other (See Comments)    Eye pain/ temple pain   Cefdinir Itching and Rash    Rash-gets blistery looking rash   Ciprofloxacin Rash   Doxazosin Anxiety   Fluoxetine Other (See Comments)    Sexual side effects   Review  of Systems Objective:  There were no vitals filed for this visit.  General: Well developed, nourished, in no acute distress, alert and oriented x3   Dermatological: Skin is warm, dry and supple bilateral. Nails x 10 are well maintained; remaining integument appears unremarkable at this time. There are no open sores, no preulcerative lesions, no rash or signs of infection present.  He also has a thick discolored toenail third digit of the right foot.  Vascular: Dorsalis Pedis artery and Posterior Tibial artery pedal pulses are 2/4 bilateral with immedate capillary fill time. Pedal hair growth present. No varicosities and no lower extremity edema present bilateral.   Neruologic: Grossly intact via light touch bilateral. Vibratory intact via tuning fork bilateral. Protective threshold with Semmes Wienstein monofilament intact to all pedal sites bilateral. Patellar and Achilles deep tendon reflexes 2+ bilateral. No Babinski or clonus noted bilateral.   Musculoskeletal: No gross boney pedal deformities bilateral. No pain, crepitus, or limitation noted with foot and ankle range of motion bilateral. Muscular strength 5/5 in all groups tested bilateral.  No reproducible pain on range of  motion of the second metatarsophalangeal joint of the right foot.  He does have a rigid plantar flexor contracture at the PIPJ.  He also demonstrates some tenderness along the lateral and dorsal lateral aspect of the proximal phalanx fifth digit right foot  Gait: Unassisted, Nonantalgic.    Radiographs:  Radiographs taken today demonstrate an osseously mature foot good bone mineralization.  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  He also has a fracture to the proximal phalanx of the fifth digit of the right foot which is healing normally.  Assessment & Plan:   Assessment: Nail dystrophy third toe.  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  Elongated toenails.  Plan: Samples of the skin and nail were taken today for pathologic evaluation.  Discussed the need for surgical intervention regarding the right second toe.  We will refer him to [facility] for nail debridement.  Follow-up with him in 1 month     Evander Macaraeg T. [place], [place]

## 2022-11-06 ENCOUNTER — Encounter: Payer: Self-pay | Admitting: Physician Assistant

## 2022-11-11 ENCOUNTER — Ambulatory Visit: Payer: Medicare HMO | Admitting: Podiatry

## 2022-11-11 ENCOUNTER — Encounter: Payer: Self-pay | Admitting: Podiatry

## 2022-11-11 VITALS — BP 135/93 | HR 76

## 2022-11-11 DIAGNOSIS — S90121D Contusion of right lesser toe(s) without damage to nail, subsequent encounter: Secondary | ICD-10-CM | POA: Diagnosis not present

## 2022-11-11 DIAGNOSIS — L603 Nail dystrophy: Secondary | ICD-10-CM

## 2022-11-11 MED ORDER — TERBINAFINE HCL 250 MG PO TABS: 250.0000 mg | ORAL_TABLET | Freq: Every day | ORAL | 0 refills | Status: AC

## 2022-11-11 NOTE — Progress Notes (Signed)
He presents today for follow-up of his fifth toe as well as his pathology results.  States that the fifth toe is still bothering him some.  Objective: Vital signs are stable he is alert and oriented x 3.  Fifth toe right foot does not demonstrate much swelling much tenderness on palpation.  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  Assessment: Saprophytic fungus toenails.  Plan: At his request we started on oral therapy instead of laser therapy.  We will try Lamisil 250 mg tablets once daily for 30 days and I will follow-up with him for blood work.

## 2022-11-19 ENCOUNTER — Other Ambulatory Visit
Admission: RE | Admit: 2022-11-19 | Discharge: 2022-11-19 | Disposition: A | Discharge status: Home / Self Care | Payer: Medicare HMO | Source: Ambulatory Visit | Attending: Rheumatology | Admitting: Rheumatology

## 2022-11-19 DIAGNOSIS — M2550 Pain in unspecified joint: Secondary | ICD-10-CM | POA: Diagnosis present

## 2022-11-19 DIAGNOSIS — I10 Essential (primary) hypertension: Secondary | ICD-10-CM | POA: Diagnosis not present

## 2022-11-19 LAB — BRAIN NATRIURETIC PEPTIDE: B Natriuretic Peptide: 72.6 pg/mL (ref 0.0–100.0)

## 2022-12-02 ENCOUNTER — Other Ambulatory Visit: Payer: Self-pay | Admitting: Interventional Radiology

## 2022-12-02 DIAGNOSIS — N401 Enlarged prostate with lower urinary tract symptoms: Secondary | ICD-10-CM

## 2022-12-21 NOTE — Progress Notes (Signed)
Referring Physician(s): Legrand Rams, MD  Reason for follow up: The patient is seen in virtual telephone follow up today s/p prostate artery embolization.   History of present illness: HPI from last follow up:  77 year old male with history of benign prostatic hyperplasia with lower urinary tract symptoms status post UroLift by Dr. Evelene Croon in 2021, and now status post prostate artery embolizations on 04/17/22 and 04/27/22.  Initial procedure on 04/17/22 from left radial access was limited by severe tortuosity of his aorta and iliac vessels, with inability of our catheters/wires to reach the target prostatic vessels, also by variant anatomy of right prostatic artery supply arising via the corona mortis.  Only the right inferior prostatic artery was embolized during the first procedure.  On 04/27/22, from femoral artery approach, the remaining right superior, and left superior and inferior prostatic arteries with embolized with particles and coils.  He had an uneventful post-procedure course.  Today he reports no real improvement in symptoms.  He is quite disappointed.  He complains of some new sexual dysfunction including absent/retrograde ejaculation with significant decrease in semen volume, pelvic pain after orgasm, and describes some induration of an area on the foreskin.  He has remained off all prostate medications.     IPSS-QoL  (03/20/22 --> 05/27/22 --> 08/25/22) Incomplete emptying:  4 --> 3 --> 5 Frequency:  4 --> 2 -- 2 Intermittency:  5 --> 2 --> 3 Urgency:  3 --> 2 --> 3 Weak stream:  5 --> 4 --> 5 Straining:  3 --> 3 --> 4 Nocturia: 4 --> 4 --> 5 QOL:  5 --> 3 --> 4   Score = 33 --> 23 --> 27   He last saw Dr. Aleene Davidson on 06/16/22 at which time PVRs were measured at 187 cc.  He will follow up there in 3 months.  Post-PAE prostate MRI was completed on 08/12/22 which shows volume reduction of ~30%.    We discussed a repeat pelvic angiogram to assess for any new or missed vascular pathways  that may be supplying the prostate gland that would be contributing to suboptimal symptom outcome but told him we should wait at least 6 months post treatment to get a full initial treatment response.   He was seen by Dr. Richardo Hanks 09/30/22 and the patient reported continued dissatisfaction with his urinary symptoms. Primary urinary complaints were nocturia, postvoid dribbling, urgency/frequency, and sensation of incomplete emptying.  There has been no significant improvement in these symptoms since our last visit. His PVR was normal at 80 ml. Dr. Richardo Hanks discussed a number of possible treatment options and the patient agreed to a trial of finasteride and daily Cialis.  He has not been compliant with these due to fear of side effects.       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 followed by neurology. 2 year follow up from now.   Chronic kidney disease    Chronic pain syndrome    DDD (degenerative disc disease), lumbar    Depression    ED (erectile dysfunction)    Esophageal candidiasis (HCC) 09/2019   treated with fluconazole for 2 weeks.    GERD (gastroesophageal  reflux disease)    Headache 10/2019   silent migraine causes vision problems   History of hiatal hernia    Hyperlipidemia    Hypertension    Lumbar radiculitis    Multiple thyroid nodules    Sleep apnea    does not use a cpap machine.    Tremors of nervous system    Vitiligo     Past Surgical History:  Procedure Laterality Date   APPENDECTOMY     CATARACT EXTRACTION W/PHACO Left 11/24/2021   Procedure: CATARACT EXTRACTION PHACO AND INTRAOCULAR LENS PLACEMENT (IOC) LEFT TORIC LENS 7.32 01:01.2;  Surgeon: Lockie Mola, MD;  Location: [facility];  Service:  Ophthalmology;  Laterality: Left;   cheek surgery Right    stainless steel in right cheek   COLONOSCOPY     COLONOSCOPY WITH PROPOFOL N/A 02/19/2017   Procedure: COLONOSCOPY WITH PROPOFOL;  Surgeon: Toledo, Boykin Nearing, MD;  Location: [facility] END[facility];  Service: Gastroenterology;  Laterality: N/A;   COLONOSCOPY WITH PROPOFOL N/A 12/26/2021   Procedure: COLONOSCOPY WITH PROPOFOL;  Surgeon: Jaynie Collins, DO;  Location: [facility] END[facility]COPY;  Service: Gastroenterology;  Laterality: N/A;   ESOPHAGOGASTRODUODENOSCOPY     ESOPHAGOGASTRODUODENOSCOPY N/A 08/17/2022   Procedure: ESOPHAGOGASTRODUODENOSCOPY (EGD);  Surgeon: Jaynie Collins, DO;  Location: [facility] END[facility];  Service: Gastroenterology;  Laterality: N/A;   ESOPHAGOGASTRODUODENOSCOPY (EGD) WITH PROPOFOL N/A 02/19/2017   Procedure: ESOPHAGOGASTRODUODENOSCOPY (EGD) WITH PROPOFOL;  Surgeon: Toledo, Boykin Nearing, MD;  Location: [facility] ENDOSCOPY;  Service: Gastroenterology;  Laterality: N/A;   ESOPHAGOGASTRODUODENOSCOPY (EGD) WITH PROPOFOL N/A 09/27/2019   Procedure: ESOPHAGOGASTRODUODENOSCOPY (EGD) WITH PROPOFOL;  Surgeon: Toledo, Boykin Nearing, MD;  Location: [facility] ENDOSCOPY;  Service: Gastroenterology;  Laterality: N/A;   FUNCTIONAL ENDOSCOPIC SINUS SURGERY     uvuloplasty done for OSA   HERNIA REPAIR     hiatal   IR ANGIOGRAM PELVIS SELECTIVE OR SUPRASELECTIVE  04/17/2022   IR ANGIOGRAM PELVIS SELECTIVE OR SUPRASELECTIVE  04/17/2022   IR ANGIOGRAM PELVIS SELECTIVE OR SUPRASELECTIVE  04/27/2022   IR ANGIOGRAM SELECTIVE EACH ADDITIONAL VESSEL  04/17/2022   IR ANGIOGRAM SELECTIVE EACH ADDITIONAL VESSEL  04/17/2022   IR ANGIOGRAM SELECTIVE EACH ADDITIONAL VESSEL  04/27/2022   IR ANGIOGRAM SELECTIVE EACH ADDITIONAL VESSEL  04/27/2022   IR CT SPINE LTD  04/17/2022   IR EMBO ARTERIAL NOT HEMORR HEMANG INC GUIDE ROADMAPPING  04/17/2022   IR EMBO ARTERIAL NOT HEMORR HEMANG INC GUIDE ROADMAPPING  04/27/2022   IR RADIOLOGIST EVAL & MGMT  03/20/2022   IR RADIOLOGIST EVAL &  MGMT  05/27/2022   IR RADIOLOGIST EVAL & MGMT  08/25/2022   IR US GUIDE VASC ACCESS LEFT  04/17/2022   IR US GUIDE VASC ACCESS RIGHT  04/27/2022   JOINT REPLACEMENT Right 2012   THR   JOINT REPLACEMENT Bilateral    TKR   TOTAL HIP ARTHROPLASTY Left 07/09/2021   Procedure: TOTAL HIP ARTHROPLASTY ANTERIOR APPROACH;  Surgeon: Ollen Gross, MD;  Location: [facility];  Service: Orthopedics;  Laterality: Left;    Allergies: Paroxetine hcl, Mirtazapine, Atorvastatin, Cefdinir, Ciprofloxacin, Doxazosin, and Fluoxetine  Medications: Prior to Admission medications   Medication Sig Start Date End Date Taking? Authorizing Provider  amLODipine (NORVASC) 10 MG tablet Take 10 mg by mouth daily.  10/27/13   [provider]  aspirin-acetaminophen-caffeine (EXCEDRIN MIGRAINE) [phone] MG tablet Take 2 tablets by mouth every 6 (six) hours as needed for headache.    [provider]  chlorhexidine (PERIDEX) 0.12 % solution SMARTSIG:By Mouth 03/26/22   [provider]  chlorthalidone (HYGROTON) 25 MG tablet Take 12.5 mg daily by mouth.    [provider]  finasteride (PROSCAR) 5 MG tablet Take 1 tablet (5 mg total) by mouth daily. 09/30/22   Sondra Come, MD  HYDROcodone-acetaminophen (NORCO/VICODIN) 5-325 MG tablet Take 0.5-1 tablets by mouth 2 (two) times daily as needed for moderate pain. 03/10/22   [provider]  KLOR-CON M20 20 MEQ tablet Take 20 mEq by mouth 2 (two) times daily. 01/07/22   [provider]  lisinopril (PRINIVIL,ZESTRIL) 40 MG tablet Take 40 mg by mouth daily. 10/23/13   [provider]  mometasone (ELOCON) 0.1 % ointment Apply topically. 06/03/22   [provider]  Multiple Vitamin (MULTIVITAMIN WITH MINERALS) TABS tablet Take 1 tablet by mouth daily.    [provider]  Multiple Vitamins-Minerals (PRESERVISION AREDS 2 PO) Take 1 capsule by mouth 2 (two) times daily.    [provider]  omeprazole  (PRILOSEC) 40 MG capsule Take 40 mg by mouth daily. 03/03/22   [provider]  OVER THE COUNTER MEDICATION Take 1 tablet by mouth daily. Vision Max supplement    [provider]  Polyvinyl Alcohol-Povidone (REFRESH OP) Place 1 drop into both eyes daily as needed (dry eyes).    [provider]  propranolol (INDERAL) 20 MG tablet Take 20 mg by mouth daily as needed (anxiety). 10/10/21   [provider]  tadalafil (CIALIS) 5 MG tablet Take 1 tablet (5 mg total) by mouth daily as needed for erectile dysfunction. 09/30/22   Sondra Come, MD  terbinafine (LAMISIL) 250 MG tablet Take 1 tablet (250 mg total) by mouth daily. 11/11/22   Hyatt, Max T, DPM     No family history on file.  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   Drug use: No   Sexual activity: Not Currently  Other Topics Concern   Not on file  Social History Narrative   Patient lives with just wife.   Social Determinants of Health   Financial Resource Strain: Patient Declined (11/17/2022)   Received from [facility]   Overall Financial Resource Strain (CARDIA)    Difficulty of Paying Living Expenses: Patient declined  Food Insecurity: Patient Declined (11/17/2022)   Received from [facility]   Hunger Vital Sign    Worried About Running Out of Food in the Last Year: Patient declined    Ran Out of Food in the Last Year: Patient declined  Transportation Needs: Patient Declined (11/17/2022)   Received from [facility] - Transportation    In the past 12 months, has lack of transportation kept you from medical appointments or from getting medications?: Patient declined    Lack of Transportation (Non-Medical): Patient declined  Physical Activity: Not on file  Stress: Not on file  Social Connections: Not on file     Vital Signs: There were no vitals taken for this visit.  No physical exam was performed in lieu of virtual telephone visit.   Imaging: CT AP 04/16/19     5.3 x 5.2 x 6.9 = 99.57 g     MRI Prostate 08/12/22  6.5 x 4.7 x 4.3 cm = ~66 g (31% volume reduction)    Labs:  CBC: Recent Labs    04/17/22 0926 04/27/22 0807  WBC 5.7 5.5  HGB 14.9 14.3  HCT 42.8 41.7  PLT 147* 155    COAGS: Recent Labs    04/17/22 0926 04/27/22 0807  INR 1.0 1.1    BMP: Recent Labs    04/17/22 0926 04/27/22 0807  NA 139 137  K 3.5 3.2*  CL 103 104  CO2 27 26  GLUCOSE 107* 104*  BUN 18 18  CALCIUM 9.2 9.1  CREATININE 0.90 0.92  GFRNONAA >60 >60    LIVER FUNCTION TESTS: No results for input(s): "BILITOT", "AST", "ALT", "ALKPHOS", "PROT", "ALBUMIN" in the last 8760 hours.  Assessment and Plan: 77 year old male with symptomatic benign prostatic hyperplasia now status post prostate artery embolization (04/27/22) with persistent severe lower urinary tract symptoms IPSS-QoL (33 [pre]-->23 -> 27).  Post-treatment MRI prostate was performed which demonstrates a 31% volume reduction in prostate size.  He continues to experience unwanted lower urinary tract symptoms.  We discussed a second look pelvic angiogram and possible repeat prostatic artery embolization if any new collateral vessels or previously atretic vessels now have supply to the prostate.  He will consider this, and if he wants to pursue it will let our office know.  Otherwise I recommend continued care with Dr. Richardo Hanks and follow up with IR as needed.  Marliss Coots, MD Pager: [phone]    I spent a  total of 40 Minutes in virtual clinical consultation, greater than 50% of which was counseling/coordinating care for benign prostatic hyperplasia.

## 2022-12-23 ENCOUNTER — Ambulatory Visit
Admission: RE | Admit: 2022-12-23 | Discharge: 2022-12-23 | Disposition: A | Discharge status: Home / Self Care | Payer: Medicare HMO | Source: Ambulatory Visit | Attending: Interventional Radiology | Admitting: Interventional Radiology

## 2022-12-23 DIAGNOSIS — N138 Other obstructive and reflux uropathy: Secondary | ICD-10-CM

## 2022-12-23 HISTORY — PX: IR RADIOLOGIST EVAL & MGMT: IMG5224

## 2023-01-04 ENCOUNTER — Encounter: Payer: Self-pay | Admitting: Podiatry

## 2023-01-14 ENCOUNTER — Ambulatory Visit: Payer: Medicare HMO | Admitting: Podiatry

## 2023-01-14 DIAGNOSIS — Z91199 Patient's noncompliance with other medical treatment and regimen due to unspecified reason: Secondary | ICD-10-CM

## 2023-01-14 NOTE — Progress Notes (Signed)
1. No-show for appointment     

## 2023-01-21 ENCOUNTER — Ambulatory Visit: Payer: Medicare HMO | Admitting: Podiatry

## 2023-04-01 ENCOUNTER — Ambulatory Visit: Payer: Medicare HMO | Admitting: Urology

## 2023-05-13 ENCOUNTER — Ambulatory Visit: Payer: Medicare HMO | Admitting: Urology

## 2023-06-09 ENCOUNTER — Emergency Department
Admission: EM | Admit: 2023-06-09 | Discharge: 2023-06-09 | Disposition: A | Discharge status: Home / Self Care | Payer: Medicare HMO | Attending: Emergency Medicine | Admitting: Emergency Medicine

## 2023-06-09 ENCOUNTER — Encounter: Payer: Self-pay | Admitting: Emergency Medicine

## 2023-06-09 ENCOUNTER — Other Ambulatory Visit: Payer: Self-pay

## 2023-06-09 DIAGNOSIS — R300 Dysuria: Secondary | ICD-10-CM

## 2023-06-09 DIAGNOSIS — N390 Urinary tract infection, site not specified: Secondary | ICD-10-CM | POA: Diagnosis not present

## 2023-06-09 DIAGNOSIS — I10 Essential (primary) hypertension: Secondary | ICD-10-CM | POA: Insufficient documentation

## 2023-06-09 DIAGNOSIS — B9689 Other specified bacterial agents as the cause of diseases classified elsewhere: Secondary | ICD-10-CM | POA: Diagnosis not present

## 2023-06-09 LAB — URINALYSIS, ROUTINE W REFLEX MICROSCOPIC
Bilirubin Urine: NEGATIVE
Glucose, UA: NEGATIVE mg/dL
Hgb urine dipstick: NEGATIVE
Ketones, ur: NEGATIVE mg/dL
Nitrite: NEGATIVE
Protein, ur: NEGATIVE mg/dL
Specific Gravity, Urine: 1.008 (ref 1.005–1.030)
Squamous Epithelial / HPF: 0 /[HPF] (ref 0–5)
pH: 7 (ref 5.0–8.0)

## 2023-06-09 LAB — BASIC METABOLIC PANEL
Anion gap: 10 (ref 5–15)
BUN: 21 mg/dL (ref 8–23)
CO2: 24 mmol/L (ref 22–32)
Calcium: 9.2 mg/dL (ref 8.9–10.3)
Chloride: 104 mmol/L (ref 98–111)
Creatinine, Ser: 0.95 mg/dL (ref 0.61–1.24)
GFR, Estimated: >60 mL/min (ref >60)
Glucose, Bld: 99 mg/dL (ref 70–99)
Potassium: 3.4 mmol/L — ABNORMAL LOW (ref 3.5–5.1)
Sodium: 138 mmol/L (ref 135–145)

## 2023-06-09 LAB — CBC
HCT: 42 % (ref 39.0–52.0)
Hemoglobin: 14.5 g/dL (ref 13.0–17.0)
MCH: 30.9 pg (ref 26.0–34.0)
MCHC: 34.5 g/dL (ref 30.0–36.0)
MCV: 89.6 fL (ref 80.0–100.0)
Platelets: 147 10*3/uL — ABNORMAL LOW (ref 150–400)
RBC: 4.69 MIL/uL (ref 4.22–5.81)
RDW: 13.1 % (ref 11.5–15.5)
WBC: 6.4 10*3/uL (ref 4.0–10.5)
nRBC: 0 % (ref 0.0–0.2)

## 2023-06-09 MED ORDER — NITROFURANTOIN MONOHYD MACRO 100 MG PO CAPS
100.0000 mg | ORAL_CAPSULE | Freq: Once | ORAL | Status: AC
  Administered 2023-06-09: 100 mg via ORAL
  Filled 2023-06-09: qty 1

## 2023-06-09 MED ORDER — NITROFURANTOIN MONOHYD MACRO 100 MG PO CAPS: 100.0000 mg | ORAL_CAPSULE | Freq: Two times a day (BID) | ORAL | 0 refills | Status: AC

## 2023-06-09 NOTE — ED Notes (Signed)
Pt verbalizes understanding of discharge instructions. Opportunity for questioning and answers were provided. Pt discharged from ED to home with wife.    

## 2023-06-09 NOTE — ED Triage Notes (Signed)
 Pt via POV from home. Pt c/o dysuria that started when he peed 2 hours ago. Reports a hx of BPH. Denies any changes in the odor or color of his urine. Reports some nausea. Denies any swelling in the groin. Pt is A&OX4 and NAD

## 2023-06-09 NOTE — ED Provider Notes (Signed)
 [facility] Provider Note    Event Date/Time   First MD Initiated Contact with Patient 06/09/23 2055     (approximate)   History   Dysuria   HPI  Jeffrey [name] is a 78 y.o. male who presents to the ED for evaluation of Dysuria   Review of PCP visit from 2/17.  Routine recheck.  History of HTN, arthritis, BPH on Rapaflo and finasteride in the past but has since stopped these.  Cialis.  UroLift procedure 2021. Also reviewed urology clinic visit from last year.  Chronic nocturia, postvoid dribbling, urgency, frequency and cessation incomplete emptying.  Patient presents for evaluation of an episode of dysuria earlier this evening.  Reports a singular episode today but had 1 or 2 days of foul-smelling urine as well.  Prior to my evaluation, he voids here in the ED and reports dysuria has resolved and was a "normal" void at that point.  No fevers, flank discomfort and reports feeling fine right now.   Physical Exam   Triage Vital Signs: ED Triage Vitals  Encounter Vitals Group     BP 06/09/23 1857 (!) 144/94     Systolic BP Percentile --      Diastolic BP Percentile --      Pulse Rate 06/09/23 1857 76     Resp 06/09/23 1857 18     Temp 06/09/23 1857 98.1 F (36.7 C)     Temp Source 06/09/23 1857 Oral     SpO2 06/09/23 1857 94 %     Weight 06/09/23 1854 260 lb (117.9 kg)     Height 06/09/23 1854 6\' 1"  (1.854 m)     Head Circumference --      Peak Flow --      Pain Score 06/09/23 1854 6     Pain Loc --      Pain Education --      Exclude from Growth Chart --     Most recent vital signs: Vitals:   06/09/23 1857 06/09/23 2104  BP: (!) 144/94 (!) 118/91  Pulse: 76 66  Resp: 18 18  Temp: 98.1 F (36.7 C)   SpO2: 94% 94%    General: Awake, no distress.  CV:  Good peripheral perfusion.  Resp:  Normal effort.  Abd:  No distention.  Soft and benign MSK:  No deformity noted.  Neuro:  No focal deficits appreciated. Other:     ED  Results / Procedures / Treatments   Labs (all labs ordered are listed, but only abnormal results are displayed) Labs Reviewed  CBC - Abnormal; Notable for the following components:      Result Value   Platelets 147 (*)    All other components within normal limits  BASIC METABOLIC PANEL - Abnormal; Notable for the following components:   Potassium 3.4 (*)    All other components within normal limits  URINALYSIS, ROUTINE W REFLEX MICROSCOPIC - Abnormal; Notable for the following components:   Color, Urine YELLOW (*)    APPearance CLEAR (*)    Leukocytes,Ua SMALL (*)    Bacteria, UA RARE (*)    All other components within normal limits  URINE CULTURE    EKG   RADIOLOGY   Official radiology report(s): No results found.  PROCEDURES and INTERVENTIONS:  Procedures  Medications  nitrofurantoin (macrocrystal-monohydrate) (MACROBID) capsule 100 mg (has no administration in time range)     IMPRESSION / MDM / ASSESSMENT AND PLAN / ED COURSE  I reviewed the  triage vital signs and the nursing notes.  Differential diagnosis includes, but is not limited to, cystitis, ureteral colic, chronic urinary changes from BPH, sepsis, inguinal or penile cellulitis  {Patient presents with symptoms of an acute illness or injury that is potentially life-threatening.  Patient presents after a singular episode of dysuria.  Urine with bacteria, 21-50 white cells and cystitis is a possibility considering his BPH.  Normal CBC and metabolic panel.  We will send for a culture, start a brief course of Macrobid and refer to urology.  Discussed expectant management and ED return precautions.      FINAL CLINICAL IMPRESSION(S) / ED DIAGNOSES   Final diagnoses:  Lower urinary tract infectious disease  Dysuria     Rx / DC Orders   ED Discharge Orders          Ordered    nitrofurantoin, macrocrystal-monohydrate, (MACROBID) 100 MG capsule  2 times daily        06/09/23 2149              Note:  This document was prepared using Dragon voice recognition software and may include unintentional dictation errors.   Delton Prairie, MD 06/09/23 2155

## 2023-06-11 LAB — URINE CULTURE: Culture: >=100000 — AB

## 2023-07-19 DIAGNOSIS — H353211 Exudative age-related macular degeneration, right eye, with active choroidal neovascularization: Secondary | ICD-10-CM | POA: Diagnosis not present

## 2023-07-29 ENCOUNTER — Ambulatory Visit: Payer: Self-pay | Admitting: Urology

## 2023-07-29 VITALS — BP 134/89 | HR 68

## 2023-07-29 DIAGNOSIS — N400 Enlarged prostate without lower urinary tract symptoms: Secondary | ICD-10-CM | POA: Diagnosis not present

## 2023-07-29 DIAGNOSIS — Z125 Encounter for screening for malignant neoplasm of prostate: Secondary | ICD-10-CM | POA: Diagnosis not present

## 2023-07-29 DIAGNOSIS — N3281 Overactive bladder: Secondary | ICD-10-CM

## 2023-07-29 LAB — BLADDER SCAN AMB NON-IMAGING: Scan Result: 66

## 2023-07-29 MED ORDER — TADALAFIL 5 MG PO TABS: 5.0000 mg | ORAL_TABLET | Freq: Every day | ORAL | 3 refills | Status: AC | PRN

## 2023-07-29 MED ORDER — GEMTESA 75 MG PO TABS
75.0000 mg | ORAL_TABLET | Freq: Every day | ORAL | 11 refills | Status: AC
Start: 2023-07-29 — End: ?

## 2023-07-29 NOTE — Patient Instructions (Signed)
 Prostate Cancer Screening  Prostate cancer screening is testing that is done to check for the presence of prostate cancer in men. The prostate gland is a walnut-sized gland that is located below the bladder and in front of the rectum in males. The function of the prostate is to add fluid to semen during ejaculation. Prostate cancer is one of the most common types of cancer in men. Who should have prostate cancer screening? Screening recommendations vary based on age and other risk factors, as well as between the professional organizations who make the recommendations. In general, screening is recommended if: You are age 79 to 36 and have an average risk for prostate cancer. You should talk with your health care provider about your need for screening and how often screening should be done. Because most prostate cancers are slow growing and will not cause death, screening in this age group is generally reserved for men who have a 10- to 15-year life expectancy. You are younger than age 79, and you have these risk factors: Having a father, brother, or uncle who has been diagnosed with prostate cancer. The risk is higher if your family member's cancer occurred at an early age or if you have multiple family members with prostate cancer at an early age. Being a male who is [place] or is of [place] or sub-Saharan African descent. In general, screening is not recommended if: You are younger than age 79. You are between the ages of 79 and 3 and you have no risk factors. You are 79 years of age or older. At this age, the risks that screening can cause are greater than the benefits that it may provide. If you are at high risk for prostate cancer, your health care provider may recommend that you have screenings more often or that you start screening at a younger age. How is screening for prostate cancer done? The recommended prostate cancer screening test is a blood test called the prostate-specific antigen  (PSA) test. PSA is a protein that is made in the prostate. As you age, your prostate naturally produces more PSA. Abnormally high PSA levels may be caused by: Prostate cancer. An enlarged prostate that is not caused by cancer (benign prostatic hyperplasia, or BPH). This condition is very common in older men. A prostate gland infection (prostatitis) or urinary tract infection. Certain medicines such as male hormones (like testosterone) or other medicines that raise testosterone levels. A rectal exam may be done as part of prostate cancer screening to help provide information about the size of your prostate gland. When a rectal exam is performed, it should be done after the PSA level is drawn to avoid any effect on the results. Depending on the PSA results, you may need more tests, such as: A physical exam to check the size of your prostate gland, if not done as part of screening. Blood and imaging tests. A procedure to remove tissue samples from your prostate gland for testing (biopsy). This is the only way to know for certain if you have prostate cancer. What are the benefits of prostate cancer screening? Screening can help to identify cancer at an early stage, before symptoms start and when the cancer can be treated more easily. There is a small chance that screening may lower your risk of dying from prostate cancer. The chance is small because prostate cancer is a slow-growing cancer, and most men with prostate cancer die from a different cause. What are the risks of prostate cancer screening? The  main risk of prostate cancer screening is diagnosing and treating prostate cancer that would never have caused any symptoms or problems. This is called overdiagnosisand overtreatment. PSA screening cannot tell you if your PSA is high due to cancer or a different cause. A prostate biopsy is the only procedure to diagnose prostate cancer. Even the results of a biopsy may not tell you if your cancer needs to  be treated. Slow-growing prostate cancer may not need any treatment other than monitoring, so diagnosing and treating it may cause unnecessary stress or other side effects. Questions to ask your health care provider When should I start prostate cancer screening? What is my risk for prostate cancer? How often do I need screening? What type of screening tests do I need? How do I get my test results? What do my results mean? Do I need treatment? Where to find more information The American Cancer Society: www.cancer.org American Urological Association: www.auanet.org Contact a health care provider if: You have difficulty urinating. You have pain when you urinate or ejaculate. You have blood in your urine or semen. You have pain in your back or in the area of your prostate. Summary Prostate cancer is a common type of cancer in men. The prostate gland is located below the bladder and in front of the rectum. This gland adds fluid to semen during ejaculation. Prostate cancer screening may identify cancer at an early stage, when the cancer can be treated more easily and is less likely to have spread to other areas of the body. The prostate-specific antigen (PSA) test is the recommended screening test for prostate cancer, but it has associated risks. Discuss the risks and benefits of prostate cancer screening with your health care provider. If you are age 79 or older, the risks that screening can cause are greater than the benefits that it may provide. This information is not intended to replace advice given to you by your health care provider. Make sure you discuss any questions you have with your health care provider. Nocturia refers to the need to wake up during the night to urinate, which can disrupt your sleep and impact your overall well-being. Fortunately, there are several strategies you can employ to help prevent or manage nocturia. It's important to consult with your healthcare provider before  making any significant changes to your routine. Here are some helpful strategies to consider:  Limit Fluid Intake Before Bed: Avoid drinking large amounts of fluids in the evening, especially within a few hours of bedtime. Consume most of your daily fluid intake earlier in the day to reduce the need to urinate at night.  Monitor Your Diet: Limit your intake of caffeine and alcohol, as these substances can increase urine production and irritate the bladder.  Avoid diet, "zero calorie," and artificially sweetened drinks, especially sodas, in the afternoon or evening. Be mindful of consuming foods and drinks with high water content before bedtime, such as watermelon and herbal teas.  Time Your Medications: If you're taking medications that contribute to increased urination, consult your healthcare provider about adjusting the timing of these medications to minimize their impact during the night.  Practice Double Voiding: Before going to bed, make an effort to empty your bladder twice within a short period. This can help reduce the amount of urine left in your bladder before sleep.  Bladder Training: Gradually increase the time between bathroom visits during the day to train your bladder to hold larger volumes of urine. Over time, this can help reduce  the frequency of nighttime awakenings to urinate.  Elevate Your Legs During the Day: Elevating your legs during the day can help minimize fluid retention in your lower extremities, which might reduce nighttime urination.  Pelvic Floor Exercises: Strengthening your pelvic floor muscles through Kegel exercises can help improve bladder control and potentially reduce the urge to urinate at night.  Create a Relaxing Bedtime Routine: Stress and anxiety can exacerbate nocturia. Engage in calming activities before bed, such as reading, listening to soothing music, or practicing relaxation techniques.  Stay Active: Engage in regular physical activity,  but avoid intense exercise close to bedtime, as this can increase your body's demand for fluids.  Maintain a Healthy Weight: Excess weight can compress the bladder and contribute to bladder and urinary issues. Aim to achieve and maintain a healthy weight through a balanced diet and regular exercise.  Remember that every individual is unique, and the effectiveness of these strategies may vary. It's important to work with your healthcare provider to develop a plan that suits your specific needs and addresses any underlying causes of nocturia.

## 2023-07-29 NOTE — Progress Notes (Signed)
 07/29/2023 11:21 AM   Rosette Reveal June 10, 1945 [id number]  Reason for visit: Follow up BPH/urinary symptoms, nocturia, questions about PSA screening, ED  HPI: 78 year old male who was previously followed by Dr. Sheppard Penton and establish care with [facility] urology in October 2023.  He underwent a UroLift with Dr. Sheppard Penton in July 2021 with no improvement in his urination after that time.  He also has a history of a benign prostate MRI in June 2021.  He also underwent a prostate artery embolization with IR in January 2024, again with minimal to no improvement in urinary symptoms afterwards.  He has not tolerated alpha-blocker secondary to retrograde ejaculation, deferred consideration of HOLEP secondary to risk of retrograde ejaculation.  He was trialed on finasteride starting in June 2024 but saw no improvement on that medication and discontinued it secondary to unclear side effects.  Prostate volume was 83 g on CT prior to prostate embolization, most recent prostate MRI ordered by IR from May 2024 showed a 66 g prostate with a small PI-RADS 3 lesion in the right anterior peripheral zone.  PSA in October 2023 was normal for his age at 5.35 with very reassuring PSA density of 0.06.  He did have a possible UTI with symptoms of dysuria treated in February 2025 with nitrofurantoin by the ER.  He continues to complain of bothersome urinary symptoms including urgency, frequency, split stream, weak stream, nocturia 3-5 times overnight.  He remains noncompliant with CPAP for sleep apnea.  He is only taking the daily Cialis, he is unsure if this helps the urination.  PVR is normal again today at 66ml.  Continues to drink soda and tea during the day.  Overall, very challenging situation as he is resistant to HOLEP secondary to concern for retrograde ejaculation, reported no improvement with UroLift from prior urologist, or prostate artery embolization with interventional radiology.  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  Reported side effects(unclear symptoms) from finasteride and stopped that medication after only a few months.  PVRs have been normal.  I previously have also offered samples of Gemtesa for more of an overactive bladder type picture, but he never tried that medication.  He had a lot of questions about PSA screening as well today.  Reassurance provided regarding his very low PSA density, no suspicious lesions on prior prostate MRI, and the AUA guidelines that do not recommend routine screening in men over age 79.  He will think about it but may get a PSA with his PCP.  Overall, challenging situation, unsure what other options we have as he is either resistant to more definitive procedures secondary to risk of potential side effects like retrograde ejaculation, has had bothersome side effects from medications were unwilling to try others.  I do think he would see benefit in the nocturia by using CPAP, and avoiding bladder irritants.  Think we need to have realistic goals moving forward.  -Behavioral strategies discussed at length -Consider urodynamics in the future if persistent symptoms and he is interested(although if he is unwilling to consider HOLEP so limited options for outlet procedure as he has already had UroLift and prostate artery embolization) -Could trial OAB medication if patient interested -RTC 1 year PVR  I spent 25 total minutes on the day of the encounter including pre-visit review of the medical record, face-to-face time with the patient, and post visit ordering of labs/imaging/tests.   Sondra Come, MD  [facility] [address], [address] [place],  [place] [address] [phone]

## 2023-08-07 ENCOUNTER — Emergency Department

## 2023-08-07 ENCOUNTER — Encounter: Payer: Self-pay | Admitting: Emergency Medicine

## 2023-08-07 ENCOUNTER — Other Ambulatory Visit: Payer: Self-pay

## 2023-08-07 ENCOUNTER — Emergency Department
Admission: EM | Admit: 2023-08-07 | Discharge: 2023-08-07 | Disposition: A | Discharge status: Home / Self Care | Attending: Emergency Medicine | Admitting: Emergency Medicine

## 2023-08-07 DIAGNOSIS — N492 Inflammatory disorders of scrotum: Secondary | ICD-10-CM | POA: Diagnosis not present

## 2023-08-07 DIAGNOSIS — N189 Chronic kidney disease, unspecified: Secondary | ICD-10-CM | POA: Diagnosis not present

## 2023-08-07 DIAGNOSIS — N50811 Right testicular pain: Secondary | ICD-10-CM | POA: Diagnosis not present

## 2023-08-07 DIAGNOSIS — N433 Hydrocele, unspecified: Secondary | ICD-10-CM | POA: Diagnosis not present

## 2023-08-07 DIAGNOSIS — N5089 Other specified disorders of the male genital organs: Secondary | ICD-10-CM | POA: Diagnosis not present

## 2023-08-07 DIAGNOSIS — K429 Umbilical hernia without obstruction or gangrene: Secondary | ICD-10-CM | POA: Diagnosis not present

## 2023-08-07 DIAGNOSIS — I129 Hypertensive chronic kidney disease with stage 1 through stage 4 chronic kidney disease, or unspecified chronic kidney disease: Secondary | ICD-10-CM | POA: Diagnosis not present

## 2023-08-07 DIAGNOSIS — R109 Unspecified abdominal pain: Secondary | ICD-10-CM | POA: Diagnosis not present

## 2023-08-07 DIAGNOSIS — K4090 Unilateral inguinal hernia, without obstruction or gangrene, not specified as recurrent: Secondary | ICD-10-CM | POA: Diagnosis not present

## 2023-08-07 LAB — URINALYSIS, W/ REFLEX TO CULTURE (INFECTION SUSPECTED)
Bilirubin Urine: NEGATIVE
Glucose, UA: NEGATIVE mg/dL
Hgb urine dipstick: NEGATIVE
Ketones, ur: NEGATIVE mg/dL
Leukocytes,Ua: NEGATIVE
Nitrite: NEGATIVE
Protein, ur: NEGATIVE mg/dL
Specific Gravity, Urine: 1.012 (ref 1.005–1.030)
pH: 7 (ref 5.0–8.0)

## 2023-08-07 LAB — COMPREHENSIVE METABOLIC PANEL WITH GFR
ALT: 22 U/L (ref 0–44)
AST: 28 U/L (ref 15–41)
Albumin: 3.8 g/dL (ref 3.5–5.0)
Alkaline Phosphatase: 53 U/L (ref 38–126)
Anion gap: 9 (ref 5–15)
BUN: 19 mg/dL (ref 8–23)
CO2: 25 mmol/L (ref 22–32)
Calcium: 9.2 mg/dL (ref 8.9–10.3)
Chloride: 103 mmol/L (ref 98–111)
Creatinine, Ser: 1.02 mg/dL (ref 0.61–1.24)
GFR, Estimated: >60 mL/min (ref >60)
Glucose, Bld: 104 mg/dL — ABNORMAL HIGH (ref 70–99)
Potassium: 3.3 mmol/L — ABNORMAL LOW (ref 3.5–5.1)
Sodium: 137 mmol/L (ref 135–145)
Total Bilirubin: 1.4 mg/dL — ABNORMAL HIGH (ref 0.0–1.2)
Total Protein: 6.5 g/dL (ref 6.5–8.1)

## 2023-08-07 LAB — CHLAMYDIA/NGC RT PCR (ARMC ONLY)
Chlamydia Tr: NOT DETECTED
N gonorrhoeae: NOT DETECTED

## 2023-08-07 LAB — CBC WITH DIFFERENTIAL/PLATELET
Abs Immature Granulocytes: 0.05 10*3/uL (ref 0.00–0.07)
Basophils Absolute: 0 10*3/uL (ref 0.0–0.1)
Basophils Relative: 0 %
Eosinophils Absolute: 0.1 10*3/uL (ref 0.0–0.5)
Eosinophils Relative: 1 %
HCT: 40.8 % (ref 39.0–52.0)
Hemoglobin: 14.2 g/dL (ref 13.0–17.0)
Immature Granulocytes: 0 %
Lymphocytes Relative: 9 %
Lymphs Abs: 1.1 10*3/uL (ref 0.7–4.0)
MCH: 31 pg (ref 26.0–34.0)
MCHC: 34.8 g/dL (ref 30.0–36.0)
MCV: 89.1 fL (ref 80.0–100.0)
Monocytes Absolute: 1.6 10*3/uL — ABNORMAL HIGH (ref 0.1–1.0)
Monocytes Relative: 13 %
Neutro Abs: 9.3 10*3/uL — ABNORMAL HIGH (ref 1.7–7.7)
Neutrophils Relative %: 77 %
Platelets: 120 10*3/uL — ABNORMAL LOW (ref 150–400)
RBC: 4.58 MIL/uL (ref 4.22–5.81)
RDW: 13.2 % (ref 11.5–15.5)
WBC: 12.3 10*3/uL — ABNORMAL HIGH (ref 4.0–10.5)
nRBC: 0 % (ref 0.0–0.2)

## 2023-08-07 LAB — LACTIC ACID, PLASMA: Lactic Acid, Venous: 1 mmol/L (ref 0.5–1.9)

## 2023-08-07 MED ORDER — SULFAMETHOXAZOLE-TRIMETHOPRIM 800-160 MG PO TABS: 1.0000 | ORAL_TABLET | Freq: Two times a day (BID) | ORAL | 0 refills | Status: DC

## 2023-08-07 MED ORDER — SULFAMETHOXAZOLE-TRIMETHOPRIM 800-160 MG PO TABS
1.0000 | ORAL_TABLET | Freq: Once | ORAL | Status: AC
  Administered 2023-08-07: 1 via ORAL
  Filled 2023-08-07: qty 1

## 2023-08-07 MED ORDER — ONDANSETRON HCL 4 MG/2ML IJ SOLN
4.0000 mg | Freq: Once | INTRAMUSCULAR | Status: AC
  Administered 2023-08-07: 4 mg via INTRAVENOUS
  Filled 2023-08-07: qty 2

## 2023-08-07 MED ORDER — IOHEXOL 300 MG/ML  SOLN
100.0000 mL | Freq: Once | INTRAMUSCULAR | Status: AC | PRN
  Administered 2023-08-07: 100 mL via INTRAVENOUS

## 2023-08-07 MED ORDER — MORPHINE SULFATE (PF) 4 MG/ML IV SOLN
4.0000 mg | Freq: Once | INTRAVENOUS | Status: AC
  Administered 2023-08-07: 4 mg via INTRAVENOUS
  Filled 2023-08-07: qty 1

## 2023-08-07 MED ORDER — HYDROCODONE-ACETAMINOPHEN 5-325 MG PO TABS: 1.0000 | ORAL_TABLET | ORAL | 0 refills | Status: AC | PRN

## 2023-08-07 MED ORDER — NYSTATIN 100000 UNIT/GM EX POWD: 1.0000 | Freq: Three times a day (TID) | CUTANEOUS | 0 refills | Status: DC

## 2023-08-07 NOTE — ED Provider Notes (Signed)
 [facility] Provider Note    Event Date/Time   First MD Initiated Contact with Patient 08/07/23 [phone]     (approximate)   History   Testicle Pain   HPI  Jeffrey [name] is a 78 y.o. male   Past medical history of CKD, chronic pain, hyperlipidemia and hypertension, who presents with right testicular pain and swelling tonight.  He was in his regular state of health yesterday he wore some tight fitting jeans to [employer] and walked around and seem to irritate his groin.  Then tonight the R testicle got a lot more painful and swollen.   No changes in urinary habits (chronic weak stream and frequency due to BPH) and no penile discharge   Independent Historian contributed to assessment above: wife at bedside corroborates info as above    Physical Exam   Triage Vital Signs: ED Triage Vitals  Encounter Vitals Group     BP 08/07/23 0557 (!) 140/97     Systolic BP Percentile --      Diastolic BP Percentile --      Pulse Rate 08/07/23 0557 (!) 103     Resp 08/07/23 0557 20     Temp 08/07/23 0557 99.1 F (37.3 C)     Temp Source 08/07/23 0557 Oral     SpO2 08/07/23 0557 97 %     Weight 08/07/23 0558 270 lb (122.5 kg)     Height --      Head Circumference --      Peak Flow --      Pain Score 08/07/23 0558 7     Pain Loc --      Pain Education --      Exclude from Growth Chart --     Most recent vital signs: Vitals:   08/07/23 0557  BP: (!) 140/97  Pulse: (!) 103  Resp: 20  Temp: 99.1 F (37.3 C)  SpO2: 97%    General: Awake, no distress.  CV:  Good peripheral perfusion.  Resp:  Normal effort.  Abd:  No distention.  Other:  He has a markedly swollen, firm, tender right testicle and the contents of the R scrotum have about x3 the volume of the L side.    ED Results / Procedures / Treatments   Labs (all labs ordered are listed, but only abnormal results are displayed) Labs Reviewed  CHLAMYDIA/NGC RT PCR ([facility] ONLY)             COMPREHENSIVE METABOLIC PANEL WITH GFR  LACTIC ACID, PLASMA  LACTIC ACID, PLASMA  CBC WITH DIFFERENTIAL/PLATELET  URINALYSIS, W/ REFLEX TO CULTURE (INFECTION SUSPECTED)       RADIOLOGY I independently reviewed and interpreted testicular ultrasound and see some hyperemia on the right side I also reviewed radiologist's formal read.   PROCEDURES:  Critical Care performed: No  Procedures   MEDICATIONS ORDERED IN ED: Medications  morphine  (PF) 4 MG/ML injection 4 mg (has no administration in time range)  ondansetron  (ZOFRAN ) injection 4 mg (has no administration in time range)   IMPRESSION / MDM / ASSESSMENT AND PLAN / ED COURSE  I reviewed the triage vital signs and the nursing notes.                                Patient's presentation is most consistent with acute presentation with potential threat to life or bodily function.  Differential diagnosis includes, but is not  limited to, testicular torsion, orchitis or epididymitis, hydrocele, varicocele, hernia   The patient is on the cardiac monitor to evaluate for evidence of arrhythmia and/or significant heart rate changes.  MDM:     Acute onset of testicular pain with swelling and tenderness concern for torsion infection or hernia.  Check ultrasound.  Check labs including urinalysis.  Give IV morphine  and Zofran  for pain and antiemetic.  Patient states the pain comes and goes and is deferring IV morphine  at this time feeling comfortable so long as he is not moving around.  Disposition will be pending the results of his lab work and results of ultrasound.      FINAL CLINICAL IMPRESSION(S) / ED DIAGNOSES   Final diagnoses:  Right testicular pain  Swelling of right testicle     Rx / DC Orders   ED Discharge Orders     None        Note:  This document was prepared using Dragon voice recognition software and may include unintentional dictation errors.    Buell Carmin, MD 08/07/23 [phone]

## 2023-08-07 NOTE — ED Triage Notes (Signed)
 Pt in ambulatory with R testicular pain and swelling since last night. Pt states pain is worsened and he feels a dime-sized lump in the testicle.

## 2023-08-07 NOTE — ED Provider Notes (Signed)
-----------------------------------------   9:09 AM on 08/07/2023 ----------------------------------------- Patient care assumed from Dr. Valetta Gaudy.  I have personally seen and evaluated the patient.  Patient does have mild to moderate right testicular tenderness on my exam there is some swelling of the right testicle as well.  Ultrasound shows increased blood flow to the right testicle as well as slight increase in size consistent with orchitis and possible inguinal hernia.  We proceeded with a CT scan to further evaluate however CT does not demonstrate any inguinal hernia.  Patient has several allergies to medications including fluoroquinolones and cephalosporins.  We will treat with Bactrim  for possible orchitis have the patient follow-up with his primary care doctor.  Urinalysis does show bacteria concerning for possible urinary tract infection and a urine culture has been added onto the patient's urinalysis.  We will place the patient on a short course of pain medication in addition to the Bactrim  and have him follow-up with his primary care doctor on Monday for recheck.  Patient agreeable plan of care.   Ruth Cove, MD 08/07/23 [phone]

## 2023-08-07 NOTE — Discharge Instructions (Addendum)
 Please take your antibiotic as prescribed for its entire course.  Please take your pain medication as needed but only as written.  Do not drink alcohol or drive while taking pain medication.  Please follow-up with your primary care doctor on Monday for recheck/reevaluation.  Return to the emergency department for any worsening pain swelling or development of fever or any other symptom personally concerning to yourself.

## 2023-08-08 LAB — URINE CULTURE: Culture: <10000 — AB

## 2023-08-10 ENCOUNTER — Other Ambulatory Visit
Admission: RE | Admit: 2023-08-10 | Discharge: 2023-08-10 | Disposition: A | Discharge status: Home / Self Care | Attending: Urology | Admitting: Urology

## 2023-08-10 ENCOUNTER — Encounter: Payer: Self-pay | Admitting: Urology

## 2023-08-10 ENCOUNTER — Ambulatory Visit: Admitting: Urology

## 2023-08-10 VITALS — BP 134/81 | HR 85 | Ht 73.0 in | Wt 255.0 lb

## 2023-08-10 DIAGNOSIS — N453 Epididymo-orchitis: Secondary | ICD-10-CM

## 2023-08-10 DIAGNOSIS — N400 Enlarged prostate without lower urinary tract symptoms: Secondary | ICD-10-CM

## 2023-08-10 DIAGNOSIS — N3281 Overactive bladder: Secondary | ICD-10-CM | POA: Diagnosis not present

## 2023-08-10 DIAGNOSIS — N529 Male erectile dysfunction, unspecified: Secondary | ICD-10-CM | POA: Insufficient documentation

## 2023-08-10 LAB — URINALYSIS, COMPLETE (UACMP) WITH MICROSCOPIC
Bilirubin Urine: NEGATIVE
Glucose, UA: NEGATIVE mg/dL
Hgb urine dipstick: NEGATIVE
Ketones, ur: NEGATIVE mg/dL
Leukocytes,Ua: NEGATIVE
Nitrite: NEGATIVE
Protein, ur: NEGATIVE mg/dL
Specific Gravity, Urine: 1.015 (ref 1.005–1.030)
pH: 7 (ref 5.0–8.0)

## 2023-08-10 MED ORDER — DOXYCYCLINE HYCLATE 100 MG PO CAPS: 100.0000 mg | ORAL_CAPSULE | Freq: Two times a day (BID) | ORAL | 0 refills | Status: DC

## 2023-08-10 MED ORDER — NAPROXEN 375 MG PO TABS
375.0000 mg | ORAL_TABLET | Freq: Two times a day (BID) | ORAL | 0 refills | Status: AC
Start: 2023-08-10 — End: ?

## 2023-08-10 NOTE — Patient Instructions (Signed)
Epididymitis  Epididymitis is inflammation or swelling of the epididymis. This is caused by an infection. The epididymis is a cord-like structure that is located along the top and back part of the testicle. It collects and stores sperm from the testicle. This condition can also cause pain and swelling of the testicle and scrotum. Symptoms usually start suddenly (acute epididymitis). Sometimes epididymitis starts gradually and lasts for a while (chronic epididymitis). Chronic epididymitis may be harder to treat. What are the causes? In men ages 1-40, this condition is usually caused by a bacterial infection or a sexually transmitted infection (STI), such as gonorrhea or chlamydia. In men 33 and older, this condition is usually caused by bacteria from a urinary blockage or from abnormalities in the urinary system. These can result from: Having a tube placed into the bladder (urinary catheter). Having an enlarged or inflamed prostate gland. Having recently had urinary tract surgery. Having a problem with a backward flow of urine (retrograde). In men who have a condition that weakens the body's defense system (immune system), such as human immunodeficiency virus (HIV), this condition can be caused by: Other bacteria, including tuberculosis and syphilis. Viruses. Fungi. Sometimes this condition occurs without infection. This may happen because of trauma or repetitive activities such as sports. What increases the risk? You are more likely to develop this condition if you have: Unprotected sex with more than one partner. Anal sex. Had recent surgery. A urinary catheter. Urinary problems. A suppressed immune system. What are the signs or symptoms? This condition usually begins suddenly with chills, fever, and pain behind the scrotum and in the testicle. Other symptoms include: Swelling of the scrotum, testicle, or both. Pain when ejaculating or urinating. Pain in the back or  abdomen. Nausea. Itching and discharge from the penis. A frequent need to pass urine. Redness, increased warmth, and tenderness of the scrotum. How is this diagnosed? Your health care provider can diagnose this condition based on your symptoms and medical history. Your health care provider will also do a physical exam to check your scrotum and testicle for swelling, pain, and redness. You may also have other tests, including: Testing of discharge from the penis. Testing your urine for infections, such as STIs. Ultrasound to check for blood flow and inflammation. Your health care provider may test you for other STIs, including HIV. How is this treated? Treatment for this condition depends on the cause. If your condition is caused by a bacterial infection, oral antibiotic medicine may be prescribed. If the bacterial infection has spread to your blood, you may need to receive IV antibiotics. For both bacterial and nonbacterial epididymitis, you may be treated with: Rest. Elevation of the scrotum. Pain medicines. Anti-inflammatory medicines. Surgery may be needed if: You have pus buildup in the scrotum (abscess). You have epididymitis that has not responded to other treatments. Follow these instructions at home: Medicines Take over-the-counter and prescription medicines only as told by your health care provider. If you were prescribed an antibiotic medicine, take it as told by your health care provider. Do not stop taking the antibiotic even if your condition improves. Sexual activity If your epididymitis was caused by an STI, avoid sexual activity until your treatment is complete. Inform your sexual partner or partners if you test positive for an STI. They may need to be treated. Do not engage in sexual activity with your partner or partners until their treatment is completed. Managing pain and swelling  If directed, raise (elevate) your scrotum and apply ice.  To do this: Put ice in a  plastic bag. Place a small towel or pillow between your legs. Rest your scrotum on the pillow or towel. Place another towel between your skin and the plastic bag. Leave the ice on for 20 minutes, 2-3 times a day. Remove the ice if your skin turns bright red. This is very important. If you cannot feel pain, heat, or cold, you have a greater risk of damage to the area. Keep your scrotum elevated and supported while resting. Ask your health care provider if you should wear a scrotal support, such as a jockstrap. Wear it as told by your health care provider. Try taking a sitz bath to help with discomfort. This is a warm water bath that is taken while you are sitting down. The water should come up to your hips and should cover your buttocks. Do this 3-4 times per day or as told by your health care provider. General instructions Drink enough fluid to keep your urine pale yellow. Return to your normal activities as told by your health care provider. Ask your health care provider what activities are safe for you. Keep all follow-up visits. This is important. Contact a health care provider if: You have a fever. Your pain medicine is not helping. Your pain is getting worse. Your symptoms do not improve within 3 days. Summary Epididymitis is inflammation or swelling of the epididymis. This is caused by an infection. This condition can also cause pain and swelling of the testicle and scrotum. Treatment for this condition depends on the cause. If your condition is caused by a bacterial infection, oral antibiotic medicine may be prescribed. Inform your sexual partner or partners if you test positive for an STI. They may need to be treated. Do not engage in sexual activity with your partner or partners until their treatment is completed. Contact a health care provider if your symptoms do not improve within 3 days. This information is not intended to replace advice given to you by your health care provider.  Make sure you discuss any questions you have with your health care provider. Document Revised: 11/06/2020 Document Reviewed: 11/06/2020 Elsevier Patient Education  2024 [employer].

## 2023-08-10 NOTE — Progress Notes (Signed)
   08/10/2023 8:30 AM   Jeffrey [name] 02/19/1946 [id number]  Reason for visit: Follow up recent epididymitis, BPH/urinary symptoms, nocturia, ED  HPI: 78 year old male who was previously followed by Dr. Sullivan Endow and establish care with [facility] urology in October 2023.  He underwent a UroLift with Dr. Sullivan Endow in July 2021 with no improvement in his urination after that time.  He also has a history of a benign prostate MRI in June 2021.  He also underwent a prostate artery embolization with IR in January 2024, again with minimal to no improvement in urinary symptoms afterwards.  He has not tolerated alpha-blocker secondary to retrograde ejaculation, deferred consideration of HOLEP secondary to risk of retrograde ejaculation.  He was trialed on finasteride  starting in June 2024 but saw no improvement on that medication and discontinued it secondary to unclear side effects.  Prostate volume was 83 g on CT prior to prostate embolization, most recent prostate MRI ordered by IR from May 2024 showed a 66 g prostate with a small PI-RADS 3 lesion in the right anterior peripheral zone.  PSA in October 2023 was normal for his age at 5.35(35% free) with very reassuring PSA density of 0.06.  He did have a possible UTI with symptoms of dysuria treated in February 2025 with nitrofurantoin  by the ER.  At our visit from 07/29/2023 he continued to complain of bothersome urinary symptoms with urgency, frequency, split stream, weak stream, nocturia 3-5 times overnight.  Noncompliant with CPAP.  PVR was normal, continues to consume large volumes of soda and tea during the day.  Has remained resistant to HOLEP secondary to concern for retrograde ejaculation.  He was recently seen in the ER on 08/07/2023 for right testicular pain.  Evaluation with ultrasound suggested right sided orchitis, CT was benign with no hydronephrosis, bladder nondistended, difficult to evaluate prostate from hip artifact.  I reviewed the notes from the  ER, and personally viewed and interpreted those images.  Culture was ultimately negative.  He denies any improvement on the Bactrim  antibiotics.  On exam significant edema and erythema in the right scrotum, tender throughout.  No evidence of abscess or crepitus.  No skin changes.  Mild groin rash stable.  We reviewed his diagnosis of right-sided epididymitis/orchitis, need to change antibiotic based on lack of clinical improvement.  I recommended a repeat UA and culture to better target antibiotic therapy.  Multiple allergies which makes medication choice challenging.  In terms of his groin rash, he was given something by his dermatologist 1 or 2 years ago that he felt was very helpful, unfortunately those notes are unavailable to me.  I recommended trying the nystatin that was prescribed in the ER, and reaching out to his dermatologist to determine what he use previously that was helpful.  Doxycycline 100 mg twice daily x 2 weeks Snug fitting underwear, NSAIDs, icing for scrotal pain RTC clinic 1 to 2 weeks symptom check to confirm improvement     Lawerence Pressman, MD  [facility] [address], [address] [place], [place] [address] [phone]

## 2023-08-13 DIAGNOSIS — B372 Candidiasis of skin and nail: Secondary | ICD-10-CM | POA: Diagnosis not present

## 2023-08-20 DIAGNOSIS — L309 Dermatitis, unspecified: Secondary | ICD-10-CM | POA: Diagnosis not present

## 2023-08-25 DIAGNOSIS — R399 Unspecified symptoms and signs involving the genitourinary system: Secondary | ICD-10-CM | POA: Diagnosis not present

## 2023-08-25 DIAGNOSIS — N452 Orchitis: Secondary | ICD-10-CM | POA: Diagnosis not present

## 2023-08-30 ENCOUNTER — Ambulatory Visit: Admitting: Urology

## 2023-08-30 DIAGNOSIS — H353211 Exudative age-related macular degeneration, right eye, with active choroidal neovascularization: Secondary | ICD-10-CM | POA: Diagnosis not present

## 2023-08-30 DIAGNOSIS — H353133 Nonexudative age-related macular degeneration, bilateral, advanced atrophic without subfoveal involvement: Secondary | ICD-10-CM | POA: Diagnosis not present

## 2023-08-31 ENCOUNTER — Encounter: Payer: Self-pay | Admitting: Urology

## 2023-08-31 ENCOUNTER — Ambulatory Visit: Admitting: Urology

## 2023-08-31 VITALS — BP 131/91 | HR 76 | Ht 73.0 in | Wt 252.0 lb

## 2023-08-31 DIAGNOSIS — L309 Dermatitis, unspecified: Secondary | ICD-10-CM | POA: Diagnosis not present

## 2023-08-31 DIAGNOSIS — N3281 Overactive bladder: Secondary | ICD-10-CM

## 2023-08-31 DIAGNOSIS — L560 Drug phototoxic response: Secondary | ICD-10-CM | POA: Diagnosis not present

## 2023-08-31 DIAGNOSIS — L408 Other psoriasis: Secondary | ICD-10-CM | POA: Diagnosis not present

## 2023-08-31 MED ORDER — AMOXICILLIN-POT CLAVULANATE 875-125 MG PO TABS: 1.0000 | ORAL_TABLET | Freq: Two times a day (BID) | ORAL | 0 refills | Status: DC

## 2023-08-31 NOTE — Progress Notes (Signed)
 08/31/2023 11:53 AM   Jeffrey [name] [birth year] [id number]  Referring provider: Rory Collard, MD [phone] [place],  [place] [address]  Urological history: 1. ED  2. BPH with LU TS - Discontinued alpha blockers secondary to retrograde ejaculation - Discontinued finasteride  for unknown reasons - UroLift (2021) ineffective - cysto (2023) prostate with large obstructing bilateral lobes and mild trabeculation - PAE (2024) ineffective - prostate volume 83 grams - Resistant to HoLEP   Chief Complaint  Patient presents with   symptom check   HPI: Jeffrey [name] is a 78 y.o. man who presents today for recheck.   Previous records reviewed.   He presented on August 10, 2023 after he had been seen in the ED on August 07, 2023 for right testicular pain and saw Dr. Estanislao Heimlich.  An ultrasound that was performed in the ED suggested right sided orchitis and the CT scan was benign although it was difficult to evaluate the prostate from hip artifact.  His urine culture was negative.  He denied any improvement on Bactrim  antibiotics.  On exam there was significant edema and erythema in the right scrotum and tender throughout with no evidence of abscess or crepitus.  There is no skin changes.  Mild groin rash that was stable.  He was switched to doxycycline  100 mg twice daily.  He was also given nystatin  cream.  Today, he feels that his swelling has decreased, but he is still uncomfortable.  He also feels that at times, the right testicle rides up high.   Patient denies any modifying or aggravating factors.  Patient denies any recent UTI's, gross hematuria, dysuria or suprapubic/flank pain.  Patient denies any fevers, chills, nausea or vomiting.    Today's UA is yellow clear, specific gravity 1.025, pH 6.0, 0-5 WBCs, 0-2 RBCs, 0-10 epithelial cells, amorphous sediment present, mucus threads present and a few bacteria.  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 followed by neurology. 2 year follow up from now.   Chronic kidney disease    Chronic pain syndrome    DDD (degenerative disc disease), lumbar    Depression    ED (erectile dysfunction)    Esophageal candidiasis (HCC) 09/2019   treated with fluconazole for 2 weeks.    GERD (gastroesophageal reflux disease)    Headache 10/2019   silent migraine causes vision problems   History of hiatal hernia    Hyperlipidemia    Hypertension    Lumbar radiculitis    Multiple thyroid  nodules    Sleep apnea    does not use a cpap machine.    Tremors of nervous system    Vitiligo     Surgical History: Past Surgical History:  Procedure Laterality Date   APPENDECTOMY     CATARACT EXTRACTION W/PHACO Left 11/24/2021   Procedure: CATARACT EXTRACTION PHACO AND INTRAOCULAR LENS PLACEMENT (IOC) LEFT TORIC LENS 7.32 01:01.2;  Surgeon: Annell Kidney, MD;  Location: [facility];  Service: Ophthalmology;  Laterality: Left;   cheek surgery Right    stainless steel in right cheek   COLONOSCOPY     COLONOSCOPY WITH PROPOFOL  N/A 02/19/2017   Procedure: COLONOSCOPY  WITH PROPOFOL ;  Surgeon: Toledo, Alphonsus Jeans, MD;  Location: [facility] END[facility]COPY;  Service: Gastroenterology;  Laterality: N/A;   COLONOSCOPY WITH PROPOFOL  N/A 12/26/2021   Procedure: COLONOSCOPY WITH PROPOFOL ;  Surgeon: Quintin Buckle, DO;  Location: [facility] END[facility]COPY;  Service: Gastroenterology;  Laterality: N/A;   ESOPHAGOGASTRODUODENOSCOPY     ESOPHAGOGASTRODUODENOSCOPY N/A 08/17/2022   Procedure: ESOPHAGOGASTRODUODENOSCOPY (EGD);  Surgeon: Quintin Buckle, DO;  Location: [facility] END[facility];  Service: Gastroenterology;  Laterality: N/A;   ESOPHAGOGASTRODUODENOSCOPY (EGD) WITH PROPOFOL  N/A 02/19/2017   Procedure:  ESOPHAGOGASTRODUODENOSCOPY (EGD) WITH PROPOFOL ;  Surgeon: Toledo, Alphonsus Jeans, MD;  Location: [facility] ENDOSCOPY;  Service: Gastroenterology;  Laterality: N/A;   ESOPHAGOGASTRODUODENOSCOPY (EGD) WITH PROPOFOL  N/A 09/27/2019   Procedure: ESOPHAGOGASTRODUODENOSCOPY (EGD) WITH PROPOFOL ;  Surgeon: Toledo, Alphonsus Jeans, MD;  Location: [facility] ENDOSCOPY;  Service: Gastroenterology;  Laterality: N/A;   FUNCTIONAL ENDOSCOPIC SINUS SURGERY     uvuloplasty done for OSA   HERNIA REPAIR     hiatal   IR ANGIOGRAM PELVIS SELECTIVE OR SUPRASELECTIVE  04/17/2022   IR ANGIOGRAM PELVIS SELECTIVE OR SUPRASELECTIVE  04/17/2022   IR ANGIOGRAM PELVIS SELECTIVE OR SUPRASELECTIVE  04/27/2022   IR ANGIOGRAM SELECTIVE EACH ADDITIONAL VESSEL  04/17/2022   IR ANGIOGRAM SELECTIVE EACH ADDITIONAL VESSEL  04/17/2022   IR ANGIOGRAM SELECTIVE EACH ADDITIONAL VESSEL  04/27/2022   IR ANGIOGRAM SELECTIVE EACH ADDITIONAL VESSEL  04/27/2022   IR CT SPINE LTD  04/17/2022   IR EMBO ARTERIAL NOT HEMORR HEMANG INC GUIDE ROADMAPPING  04/17/2022   IR EMBO ARTERIAL NOT HEMORR HEMANG INC GUIDE ROADMAPPING  04/27/2022   IR RADIOLOGIST EVAL & MGMT  03/20/2022   IR RADIOLOGIST EVAL & MGMT  05/27/2022   IR RADIOLOGIST EVAL & MGMT  08/25/2022   IR RADIOLOGIST EVAL & MGMT  12/23/2022   IR US  GUIDE VASC ACCESS LEFT  04/17/2022   IR US  GUIDE VASC ACCESS RIGHT  04/27/2022   JOINT REPLACEMENT Right 2012   THR   JOINT REPLACEMENT Bilateral    TKR   TOTAL HIP ARTHROPLASTY Left 07/09/2021   Procedure: TOTAL HIP ARTHROPLASTY ANTERIOR APPROACH;  Surgeon: Liliane Rei, MD;  Location: [facility];  Service: Orthopedics;  Laterality: Left;    Home Medications:  Allergies as of 08/31/2023       Reactions   Paroxetine Hcl Other (See Comments)   Anger, not a nice person   Mirtazapine Other (See Comments)   Unknown reaction    Atorvastatin Other (See Comments)   Eye pain/ temple pain   Cefdinir Itching, Rash   Rash-gets blistery looking rash   Ciprofloxacin Rash   Doxazosin  Anxiety   Fluoxetine Other (See Comments)   Sexual side effects        Medication List        Accurate as of Aug 31, 2023 11:59 PM. If you have any questions, ask your nurse or doctor.          STOP taking these medications    doxycycline  100 MG capsule Commonly known as: VIBRAMYCIN  Stopped by: Nicholous Girgenti   nystatin  powder Commonly known as: MYCOSTATIN /NYSTOP  Stopped by: Derriana Oser       TAKE these medications    amLODipine  10 MG tablet Commonly known as: NORVASC  Take 10 mg by mouth daily.   amoxicillin-clavulanate 875-125 MG tablet Commonly known as: AUGMENTIN Take 1 tablet by mouth every 12 (twelve) hours. Started by: Leray Garverick   aspirin-acetaminophen -caffeine 250-250-65 MG tablet Commonly known as: EXCEDRIN MIGRAINE Take 2 tablets by mouth every 6 (six) hours  as needed for headache.   chlorhexidine  0.12 % solution Commonly known as: PERIDEX SMARTSIG:By Mouth   chlorthalidone  25 MG tablet Commonly known as: HYGROTON  Take 12.5 mg daily by mouth.   Gemtesa  75 MG Tabs Generic drug: Vibegron  Take 1 tablet (75 mg total) by mouth daily.   HYDROcodone -acetaminophen  5-325 MG tablet Commonly known as: NORCO/VICODIN Take 1 tablet by mouth every 4 (four) hours as needed.   Klor-Con  M20 20 MEQ tablet Generic drug: potassium chloride  SA Take 20 mEq by mouth 2 (two) times daily.   lisinopril 40 MG tablet Commonly known as: ZESTRIL Take 40 mg by mouth daily.   mometasone 0.1 % ointment Commonly known as: ELOCON Apply topically.   multivitamin with minerals Tabs tablet Take 1 tablet by mouth daily.   naproxen  375 MG tablet Commonly known as: NAPROSYN  Take 1 tablet (375 mg total) by mouth 2 (two) times daily with a meal.   omeprazole 40 MG capsule Commonly known as: PRILOSEC Take 40 mg by mouth daily.   OVER THE COUNTER MEDICATION Take 1 tablet by mouth daily. Vision Max supplement   PRESERVISION AREDS 2 PO Take 1 capsule by  mouth 2 (two) times daily.   propranolol 20 MG tablet Commonly known as: INDERAL Take 20 mg by mouth daily as needed (anxiety).   REFRESH OP Place 1 drop into both eyes daily as needed (dry eyes).   tadalafil  5 MG tablet Commonly known as: CIALIS  Take 1 tablet (5 mg total) by mouth daily as needed for erectile dysfunction.   terbinafine  250 MG tablet Commonly known as: LamISIL  Take 1 tablet (250 mg total) by mouth daily.        Allergies:  Allergies  Allergen Reactions   Paroxetine Hcl Other (See Comments)    Anger, not a nice person   Mirtazapine Other (See Comments)    Unknown reaction    Atorvastatin Other (See Comments)    Eye pain/ temple pain   Cefdinir Itching and Rash    Rash-gets blistery looking rash   Ciprofloxacin Rash   Doxazosin Anxiety   Fluoxetine Other (See Comments)    Sexual side effects    Family History: No family history on file.  Social History:  reports that he has never smoked. He has never been exposed to tobacco smoke. He has never used smokeless tobacco. He reports that he does not currently use alcohol. He reports that he does not use drugs.  ROS: Pertinent ROS in HPI  Physical Exam: BP (!) 131/91   Pulse 76   Ht 6\' 1"  (1.854 m)   Wt 252 lb (114.3 kg)   BMI 33.25 kg/m   Constitutional:  Well nourished. Alert and oriented, No acute distress. HEENT: [place] AT, moist mucus membranes.  Trachea midline Cardiovascular: No clubbing, cyanosis, or edema. Respiratory: Normal respiratory effort, no increased work of breathing. GU: No CVA tenderness.  No bladder fullness or masses.  Patient with circumcised phallus.   Urethral meatus is patent.  No penile discharge. No penile lesions or rashes.  Left hemi scrotum without lesions, cysts, rashes and/or edema.  Left testicle is located. No masses are appreciated in the testicles. Left epididymis is normal.  Right hemiscrotum is enlarged, but there is no edema or erythema.  The right testicle is  tender to palpation and indurated in the right epididymis is tender to palpation, there is no crepitus or fluctuance appreciated.  There are no skin changes. Neurologic: Grossly intact, no focal deficits, moving all 4 extremities.  Psychiatric: Normal mood and affect.  Laboratory Data: Lab Results  Component Value Date   WBC 12.3 (H) 08/07/2023   HGB 14.2 08/07/2023   HCT 40.8 08/07/2023   MCV 89.1 08/07/2023   PLT 120 (L) 08/07/2023    Lab Results  Component Value Date   CREATININE 1.02 08/07/2023    Lab Results  Component Value Date   AST 28 08/07/2023   Lab Results  Component Value Date   ALT 22 08/07/2023    Urinalysis See EPIC and HPI  I have reviewed the labs.   Pertinent Imaging: N/A  Assessment & Plan:    1. Right epididymitis and orchitis - resolving  - UA benign - urine sent for culture to help guide antibiotic coverage - Will start Augmentin 875/125 twice daily for 2 weeks as testicle is still tender  Return in about 2 weeks (around 09/14/2023) for symptom recheck .  These notes generated with voice recognition software. I apologize for typographical errors.  Briant Camper  [facility] [address]  [address] [place], [place] [address] [phone]

## 2023-09-01 ENCOUNTER — Telehealth: Payer: Self-pay

## 2023-09-01 LAB — URINALYSIS, COMPLETE
Bilirubin, UA: NEGATIVE
Glucose, UA: NEGATIVE
Ketones, UA: NEGATIVE
Leukocytes,UA: NEGATIVE
Nitrite, UA: NEGATIVE
Protein,UA: NEGATIVE
RBC, UA: NEGATIVE
Specific Gravity, UA: 1.025 (ref 1.005–1.030)
Urobilinogen, Ur: 0.2 mg/dL (ref 0.2–1.0)
pH, UA: 6 (ref 5.0–7.5)

## 2023-09-01 LAB — MICROSCOPIC EXAMINATION

## 2023-09-01 NOTE — Telephone Encounter (Signed)
 Pt LM on triage line stating he saw his urinalysis results via MyChart showing mucus and crystals. He questions if he should continue his antibiotic.    Called pt advised him to continue antibiotic treatment until we receive finalized urine culture results. Pt voiced understanding.

## 2023-09-03 ENCOUNTER — Ambulatory Visit: Payer: Self-pay | Admitting: Urology

## 2023-09-03 LAB — CULTURE, URINE COMPREHENSIVE

## 2023-09-11 NOTE — Progress Notes (Unsigned)
 09/14/2023 7:56 AM   [name] [birth year] [id number]  Referring provider: Rory Collard, MD [phone] [place],  [place] [address]  Urological history: 1. ED  2. BPH with LU TS - Discontinued alpha blockers secondary to retrograde ejaculation - Discontinued finasteride  for unknown reasons - UroLift (2021) ineffective - cysto (2023) prostate with large obstructing bilateral lobes and mild trabeculation - PAE (2024) ineffective - prostate volume 83 grams - Resistant to HoLEP   No chief complaint on file.  HPI: [name] is a 78 y.o. man who presents today for recheck.   Previous records reviewed.   He presented on August 10, 2023 after he had been seen in the ED on August 07, 2023 for right testicular pain and saw Dr. Estanislao [name].  An ultrasound that was performed in the ED suggested right sided orchitis and the CT scan was benign although it was difficult to evaluate the prostate from hip artifact.  His urine culture was negative.  He denied any improvement on Bactrim  antibiotics.  On exam there was significant edema and erythema in the right scrotum and tender throughout with no evidence of abscess or crepitus.  There is no skin changes.  Mild groin rash that was stable.  He was switched to doxycycline  100 mg twice daily.  He was also given nystatin  cream.  At his visit on 08/31/2023, he feels that his swelling has decreased, but he is still uncomfortable.  He also feels that at times, the right testicle rides up high.   Patient denies any modifying or aggravating factors.  Patient denies any recent UTI's, gross hematuria, dysuria or suprapubic/flank pain.  Patient denies any fevers, chills, nausea or vomiting.   UA is yellow clear, specific gravity 1.025, pH 6.0, 0-5 WBCs, 0-2 RBCs, 0-10 epithelial cells, amorphous sediment present, mucus threads present and a few bacteria.  Urine culture negative.   Testicle was still tender on exam, so he was started on Augmentin .     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 followed by neurology. 2 year follow up from now.   Chronic [name] disease    Chronic pain syndrome    DDD (degenerative disc disease), lumbar    Depression    ED (erectile dysfunction)    Esophageal candidiasis (HCC) 09/2019   treated with fluconazole for 2 weeks.    GERD (gastroesophageal reflux disease)    Headache 10/2019   silent migraine causes vision problems   History of hiatal hernia    Hyperlipidemia    Hypertension    Lumbar radiculitis    Multiple thyroid  nodules    Sleep apnea    does not use a cpap machine.    Tremors of nervous system    Vitiligo     Surgical History: Past Surgical History:  Procedure Laterality Date   APPENDECTOMY     CATARACT EXTRACTION W/PHACO Left 11/24/2021   Procedure: CATARACT EXTRACTION PHACO AND INTRAOCULAR LENS PLACEMENT (IOC) LEFT TORIC LENS 7.32 01:01.2;  Surgeon: Jeffrey Kidney, MD;  Location: [name];  Service: Ophthalmology;  Laterality: Left;   cheek surgery Right    stainless steel in right cheek  COLONOSCOPY     COLONOSCOPY WITH PROPOFOL  N/A 02/19/2017   Procedure: COLONOSCOPY WITH PROPOFOL ;  Surgeon: Toledo, Alphonsus Jeans, MD;  Location: [name];  Service: Gastroenterology;  Laterality: N/A;   COLONOSCOPY WITH PROPOFOL  N/A 12/26/2021   Procedure: COLONOSCOPY WITH PROPOFOL ;  Surgeon: Jeffrey Buckle, DO;  [name];  Service: Gastroenterology;  Laterality: N/A;   ESOPHAGOGASTRODUODENOSCOPY     ESOPHAGOGASTRODUODENOSCOPY N/A 08/17/2022   Procedure: ESOPHAGOGASTRODUODENOSCOPY (EGD);  Surgeon: Jeffrey Buckle, DO;  [name];  Service: Gastroenterology;  Laterality: N/A;    ESOPHAGOGASTRODUODENOSCOPY (EGD) WITH PROPOFOL  N/A 02/19/2017   Procedure: ESOPHAGOGASTRODUODENOSCOPY (EGD) WITH PROPOFOL ;  Surgeon: Toledo, Alphonsus Jeans, MD;  [facility]: [name];  Service: Gastroenterology;  Laterality: N/A;   ESOPHAGOGASTRODUODENOSCOPY (EGD) WITH PROPOFOL  N/A 09/27/2019   Procedure: ESOPHAGOGASTRODUODENOSCOPY (EGD) WITH PROPOFOL ;  Surgeon: Toledo, Alphonsus Jeans, MD;  [name];  Service: Gastroenterology;  Laterality: N/A;   FUNCTIONAL ENDOSCOPIC SINUS SURGERY     uvuloplasty done for OSA   HERNIA REPAIR     hiatal   IR ANGIOGRAM PELVIS SELECTIVE OR SUPRASELECTIVE  04/17/2022   IR ANGIOGRAM PELVIS SELECTIVE OR SUPRASELECTIVE  04/17/2022   IR ANGIOGRAM PELVIS SELECTIVE OR SUPRASELECTIVE  04/27/2022   IR ANGIOGRAM SELECTIVE EACH ADDITIONAL VESSEL  04/17/2022   IR ANGIOGRAM SELECTIVE EACH ADDITIONAL VESSEL  04/17/2022   IR ANGIOGRAM SELECTIVE EACH ADDITIONAL VESSEL  04/27/2022   IR ANGIOGRAM SELECTIVE EACH ADDITIONAL VESSEL  04/27/2022   IR CT SPINE LTD  04/17/2022   IR EMBO ARTERIAL NOT HEMORR HEMANG INC GUIDE ROADMAPPING  04/17/2022   IR EMBO ARTERIAL NOT HEMORR HEMANG INC GUIDE ROADMAPPING  04/27/2022   IR RADIOLOGIST EVAL & MGMT  03/20/2022   IR RADIOLOGIST EVAL & MGMT  05/27/2022   IR RADIOLOGIST EVAL & MGMT  08/25/2022   IR RADIOLOGIST EVAL & MGMT  12/23/2022   IR US  GUIDE VASC ACCESS LEFT  04/17/2022   IR US  GUIDE VASC ACCESS RIGHT  04/27/2022   JOINT REPLACEMENT Right 2012   THR   JOINT REPLACEMENT Bilateral    TKR   TOTAL HIP ARTHROPLASTY Left 07/09/2021   Procedure: TOTAL HIP ARTHROPLASTY ANTERIOR APPROACH;  Surgeon: Jeffrey Rei, MD;  Location: [facility];  Service: Orthopedics;  Laterality: Left;    Home Medications:  Allergies as of 09/14/2023       Reactions   Paroxetine Hcl Other (See Comments)   Anger, not a nice person   Mirtazapine Other (See Comments)   Unknown reaction    Atorvastatin Other (See Comments)   Eye pain/ temple pain   Cefdinir Itching, Rash    Rash-gets blistery looking rash   Ciprofloxacin Rash   Doxazosin Anxiety   Fluoxetine Other (See Comments)   Sexual side effects        Medication List        Accurate as of Sep 11, 2023  7:56 AM. If you have any questions, ask your nurse or doctor.          amLODipine  10 MG tablet Commonly known as: NORVASC  Take 10 mg by mouth daily.   amoxicillin -clavulanate 875-125 MG tablet Commonly known as: AUGMENTIN  Take 1 tablet by mouth every 12 (twelve) hours.   aspirin-acetaminophen -caffeine 250-250-65 MG tablet Commonly known as: EXCEDRIN MIGRAINE Take 2 tablets by mouth every 6 (six) hours as needed for headache.   chlorhexidine  0.12 % solution Commonly known as: PERIDEX SMARTSIG:By Mouth   chlorthalidone  25 MG tablet Commonly known as: HYGROTON  Take 12.5 mg daily by mouth.  Gemtesa  75 MG Tabs Generic drug: Vibegron  Take 1 tablet (75 mg total) by mouth daily.   HYDROcodone -acetaminophen  5-325 MG tablet Commonly known as: NORCO/VICODIN Take 1 tablet by mouth every 4 (four) hours as needed.   Klor-Con  M20 20 MEQ tablet Generic drug: potassium chloride  SA Take 20 mEq by mouth 2 (two) times daily.   lisinopril 40 MG tablet Commonly known as: ZESTRIL Take 40 mg by mouth daily.   mometasone 0.1 % ointment Commonly known as: ELOCON Apply topically.   multivitamin with minerals Tabs tablet Take 1 tablet by mouth daily.   naproxen  375 MG tablet Commonly known as: NAPROSYN  Take 1 tablet (375 mg total) by mouth 2 (two) times daily with a meal.   omeprazole 40 MG capsule Commonly known as: PRILOSEC Take 40 mg by mouth daily.   OVER THE COUNTER MEDICATION Take 1 tablet by mouth daily. Vision Max supplement   PRESERVISION AREDS 2 PO Take 1 capsule by mouth 2 (two) times daily.   propranolol 20 MG tablet Commonly known as: INDERAL Take 20 mg by mouth daily as needed (anxiety).   REFRESH OP Place 1 drop into both eyes daily as needed (dry eyes).    tadalafil  5 MG tablet Commonly known as: CIALIS  Take 1 tablet (5 mg total) by mouth daily as needed for erectile dysfunction.   terbinafine  250 MG tablet Commonly known as: LamISIL  Take 1 tablet (250 mg total) by mouth daily.        Allergies:  Allergies  Allergen Reactions   Paroxetine Hcl Other (See Comments)    Anger, not a nice person   Mirtazapine Other (See Comments)    Unknown reaction    Atorvastatin Other (See Comments)    Eye pain/ temple pain   Cefdinir Itching and Rash    Rash-gets blistery looking rash   Ciprofloxacin Rash   Doxazosin Anxiety   Fluoxetine Other (See Comments)    Sexual side effects    Family History: No family history on file.  Social History:  reports that he has never smoked. He has never been exposed to tobacco smoke. He has never used smokeless tobacco. He reports that he does not currently use alcohol. He reports that he does not use drugs.  ROS: Pertinent ROS in HPI  Physical Exam: There were no vitals taken for this visit.  Constitutional:  Well nourished. Alert and oriented, No acute distress. HEENT: [place] AT, moist mucus membranes.  Trachea midline, no masses. Cardiovascular: No clubbing, cyanosis, or edema. Respiratory: Normal respiratory effort, no increased work of breathing. GI: Abdomen is soft, non tender, non distended, no abdominal masses. Liver and spleen not palpable.  No hernias appreciated.  Stool sample for occult testing is not indicated.   GU: No CVA tenderness.  No bladder fullness or masses.  Patient with circumcised/uncircumcised phallus. ***Foreskin easily retracted***  Urethral meatus is patent.  No penile discharge. No penile lesions or rashes. Scrotum without lesions, cysts, rashes and/or edema.  Testicles are located scrotally bilaterally. No masses are appreciated in the testicles. Left and right epididymis are normal. Rectal: Patient with  normal sphincter tone. Anus and perineum without scarring or rashes. No  rectal masses are appreciated. Prostate is approximately *** grams, *** nodules are appreciated. Seminal vesicles are normal. Skin: No rashes, bruises or suspicious lesions. Lymph: No cervical or inguinal adenopathy. Neurologic: Grossly intact, no focal deficits, moving all 4 extremities. Psychiatric: Normal mood and affect.   Laboratory Data: N/A   Pertinent Imaging: N/A  Assessment & Plan:    1. Right epididymitis and orchitis -   No follow-ups on file.  These notes generated with voice recognition software. I apologize for typographical errors.  Briant Camper  [facility] [address]  [address] [place], [place] [address] [phone]

## 2023-09-14 ENCOUNTER — Ambulatory Visit: Admitting: Urology

## 2023-09-14 ENCOUNTER — Encounter: Payer: Self-pay | Admitting: Urology

## 2023-09-14 VITALS — BP 138/88 | HR 87 | Ht 73.0 in | Wt 255.5 lb

## 2023-09-14 DIAGNOSIS — Z125 Encounter for screening for malignant neoplasm of prostate: Secondary | ICD-10-CM

## 2023-09-14 DIAGNOSIS — N412 Abscess of prostate: Secondary | ICD-10-CM | POA: Diagnosis not present

## 2023-09-14 DIAGNOSIS — N453 Epididymo-orchitis: Secondary | ICD-10-CM | POA: Diagnosis not present

## 2023-09-14 DIAGNOSIS — N3281 Overactive bladder: Secondary | ICD-10-CM | POA: Diagnosis not present

## 2023-09-14 MED ORDER — AMOXICILLIN-POT CLAVULANATE 875-125 MG PO TABS: 1.0000 | ORAL_TABLET | Freq: Two times a day (BID) | ORAL | 0 refills | Status: AC

## 2023-09-16 ENCOUNTER — Ambulatory Visit
Admission: RE | Admit: 2023-09-16 | Discharge: 2023-09-16 | Disposition: A | Discharge status: Home / Self Care | Source: Ambulatory Visit | Attending: Urology | Admitting: Urology

## 2023-09-16 DIAGNOSIS — K5730 Diverticulosis of large intestine without perforation or abscess without bleeding: Secondary | ICD-10-CM | POA: Diagnosis not present

## 2023-09-16 DIAGNOSIS — N412 Abscess of prostate: Secondary | ICD-10-CM | POA: Diagnosis not present

## 2023-09-16 DIAGNOSIS — Z96643 Presence of artificial hip joint, bilateral: Secondary | ICD-10-CM | POA: Diagnosis not present

## 2023-09-16 DIAGNOSIS — I517 Cardiomegaly: Secondary | ICD-10-CM | POA: Diagnosis not present

## 2023-09-16 DIAGNOSIS — I700 Atherosclerosis of aorta: Secondary | ICD-10-CM | POA: Diagnosis not present

## 2023-09-16 DIAGNOSIS — K4020 Bilateral inguinal hernia, without obstruction or gangrene, not specified as recurrent: Secondary | ICD-10-CM | POA: Diagnosis not present

## 2023-09-16 DIAGNOSIS — N400 Enlarged prostate without lower urinary tract symptoms: Secondary | ICD-10-CM | POA: Diagnosis not present

## 2023-09-16 MED ORDER — IOHEXOL 300 MG/ML  SOLN
100.0000 mL | Freq: Once | INTRAMUSCULAR | Status: AC | PRN
  Administered 2023-09-16: 100 mL via INTRAVENOUS

## 2023-09-17 ENCOUNTER — Ambulatory Visit: Payer: Self-pay | Admitting: Urology

## 2023-09-17 ENCOUNTER — Telehealth: Payer: Self-pay | Admitting: Urology

## 2023-09-17 NOTE — Telephone Encounter (Signed)
 We need to recheck an UA and urine culture on him when he finishes his antibiotics.  It looks like he will finish his antibiotics on the 17th, so if we can schedule the lab visit the week of June 23rd.  That should work.

## 2023-10-01 ENCOUNTER — Other Ambulatory Visit: Payer: Self-pay

## 2023-10-01 DIAGNOSIS — N3281 Overactive bladder: Secondary | ICD-10-CM

## 2023-10-04 ENCOUNTER — Other Ambulatory Visit

## 2023-10-04 DIAGNOSIS — N3281 Overactive bladder: Secondary | ICD-10-CM

## 2023-10-04 LAB — URINALYSIS, COMPLETE
Bilirubin, UA: NEGATIVE
Glucose, UA: NEGATIVE
Ketones, UA: NEGATIVE
Leukocytes,UA: NEGATIVE
Nitrite, UA: NEGATIVE
Protein,UA: NEGATIVE
RBC, UA: NEGATIVE
Specific Gravity, UA: <1.005 — ABNORMAL LOW (ref 1.005–1.030)
Urobilinogen, Ur: 0.2 mg/dL (ref 0.2–1.0)
pH, UA: 6.5 (ref 5.0–7.5)

## 2023-10-04 LAB — MICROSCOPIC EXAMINATION
Bacteria, UA: NONE SEEN
RBC, Urine: NONE SEEN /HPF (ref 0–2)

## 2023-10-05 DIAGNOSIS — H353211 Exudative age-related macular degeneration, right eye, with active choroidal neovascularization: Secondary | ICD-10-CM | POA: Diagnosis not present

## 2023-10-06 DIAGNOSIS — S30861A Insect bite (nonvenomous) of abdominal wall, initial encounter: Secondary | ICD-10-CM | POA: Diagnosis not present

## 2023-10-06 DIAGNOSIS — W57XXXA Bitten or stung by nonvenomous insect and other nonvenomous arthropods, initial encounter: Secondary | ICD-10-CM | POA: Diagnosis not present

## 2023-10-07 LAB — CULTURE, URINE COMPREHENSIVE

## 2023-10-08 ENCOUNTER — Ambulatory Visit: Payer: Self-pay | Admitting: Urology

## 2023-10-08 LAB — CULTURE, URINE COMPREHENSIVE

## 2023-12-15 ENCOUNTER — Encounter: Payer: Self-pay | Admitting: Urology

## 2024-08-02 ENCOUNTER — Ambulatory Visit: Admitting: Urology

## 2024-08-03 ENCOUNTER — Ambulatory Visit: Admitting: Urology
